# Patient Record
Sex: Female | Born: 1965 | Race: White | Hispanic: No | Marital: Single | Smoking: Never smoker
Health system: Southern US, Community
[De-identification: ages and names within clinical notes are randomized; demographics above are authoritative.]

## PROBLEM LIST (undated history)

## (undated) DIAGNOSIS — I341 Nonrheumatic mitral (valve) prolapse: Secondary | ICD-10-CM

## (undated) DIAGNOSIS — M419 Scoliosis, unspecified: Secondary | ICD-10-CM

## (undated) DIAGNOSIS — D219 Benign neoplasm of connective and other soft tissue, unspecified: Secondary | ICD-10-CM

## (undated) DIAGNOSIS — M549 Dorsalgia, unspecified: Secondary | ICD-10-CM

## (undated) DIAGNOSIS — M21372 Foot drop, left foot: Secondary | ICD-10-CM

## (undated) DIAGNOSIS — A4902 Methicillin resistant Staphylococcus aureus infection, unspecified site: Secondary | ICD-10-CM

## (undated) DIAGNOSIS — R5383 Other fatigue: Secondary | ICD-10-CM

## (undated) DIAGNOSIS — I10 Essential (primary) hypertension: Secondary | ICD-10-CM

## (undated) DIAGNOSIS — T148XXA Other injury of unspecified body region, initial encounter: Secondary | ICD-10-CM

## (undated) DIAGNOSIS — E119 Type 2 diabetes mellitus without complications: Secondary | ICD-10-CM

## (undated) DIAGNOSIS — M1990 Unspecified osteoarthritis, unspecified site: Secondary | ICD-10-CM

## (undated) DIAGNOSIS — D649 Anemia, unspecified: Secondary | ICD-10-CM

## (undated) DIAGNOSIS — R61 Generalized hyperhidrosis: Secondary | ICD-10-CM

## (undated) DIAGNOSIS — K649 Unspecified hemorrhoids: Secondary | ICD-10-CM

## (undated) DIAGNOSIS — L089 Local infection of the skin and subcutaneous tissue, unspecified: Secondary | ICD-10-CM

## (undated) HISTORY — DX: Other fatigue: R53.83

## (undated) HISTORY — PX: SPINE SURGERY: SHX786

## (undated) HISTORY — DX: Unspecified hemorrhoids: K64.9

## (undated) HISTORY — PX: GASTRIC BYPASS: SHX52

## (undated) HISTORY — PX: BACK SURGERY: SHX140

## (undated) HISTORY — PX: HERNIA REPAIR: SHX51

## (undated) HISTORY — PX: BREAST LUMPECTOMY: SHX2

## (undated) HISTORY — DX: Unspecified osteoarthritis, unspecified site: M19.90

## (undated) HISTORY — DX: Generalized hyperhidrosis: R61

## (undated) HISTORY — DX: Methicillin resistant Staphylococcus aureus infection, unspecified site: A49.02

## (undated) HISTORY — DX: Essential (primary) hypertension: I10

---
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
## 2009-01-15 ENCOUNTER — Encounter: Admission: RE | Admit: 2009-01-15 | Discharge: 2009-01-15 | Payer: Self-pay | Admitting: *Deleted

## 2009-04-24 ENCOUNTER — Ambulatory Visit: Payer: Self-pay | Admitting: Vascular Surgery

## 2009-04-24 ENCOUNTER — Inpatient Hospital Stay (HOSPITAL_COMMUNITY): Admission: RE | Admit: 2009-04-24 | Discharge: 2009-04-28 | Payer: Self-pay | Admitting: *Deleted

## 2009-05-06 ENCOUNTER — Encounter: Admission: RE | Admit: 2009-05-06 | Discharge: 2009-05-06 | Payer: Self-pay | Admitting: *Deleted

## 2009-05-12 ENCOUNTER — Inpatient Hospital Stay (HOSPITAL_COMMUNITY): Admission: RE | Admit: 2009-05-12 | Discharge: 2009-05-20 | Payer: Self-pay | Admitting: *Deleted

## 2009-05-19 ENCOUNTER — Ambulatory Visit: Payer: Self-pay | Admitting: Physical Medicine & Rehabilitation

## 2009-05-20 ENCOUNTER — Inpatient Hospital Stay (HOSPITAL_COMMUNITY)
Admission: RE | Admit: 2009-05-20 | Discharge: 2009-05-29 | Payer: Self-pay | Admitting: Physical Medicine & Rehabilitation

## 2009-05-20 ENCOUNTER — Ambulatory Visit: Payer: Self-pay | Admitting: Physical Medicine & Rehabilitation

## 2010-04-01 ENCOUNTER — Encounter: Admission: RE | Admit: 2010-04-01 | Discharge: 2010-04-01 | Payer: Self-pay | Admitting: *Deleted

## 2010-04-17 ENCOUNTER — Encounter
Admission: RE | Admit: 2010-04-17 | Discharge: 2010-04-17 | Payer: Self-pay | Source: Home / Self Care | Attending: Orthopedic Surgery | Admitting: Orthopedic Surgery

## 2010-06-26 ENCOUNTER — Encounter (HOSPITAL_BASED_OUTPATIENT_CLINIC_OR_DEPARTMENT_OTHER): Payer: 59 | Attending: General Surgery

## 2010-06-26 DIAGNOSIS — Z980 Intestinal bypass and anastomosis status: Secondary | ICD-10-CM | POA: Insufficient documentation

## 2010-06-26 DIAGNOSIS — L0501 Pilonidal cyst with abscess: Secondary | ICD-10-CM | POA: Insufficient documentation

## 2010-07-03 ENCOUNTER — Encounter (HOSPITAL_BASED_OUTPATIENT_CLINIC_OR_DEPARTMENT_OTHER): Payer: 59

## 2010-07-10 ENCOUNTER — Encounter (HOSPITAL_BASED_OUTPATIENT_CLINIC_OR_DEPARTMENT_OTHER): Payer: 59 | Attending: General Surgery

## 2010-07-10 DIAGNOSIS — Z980 Intestinal bypass and anastomosis status: Secondary | ICD-10-CM | POA: Insufficient documentation

## 2010-07-10 DIAGNOSIS — L0501 Pilonidal cyst with abscess: Secondary | ICD-10-CM | POA: Insufficient documentation

## 2010-07-26 LAB — DIFFERENTIAL
Eosinophils Relative: 3 % (ref 0–5)
Lymphocytes Relative: 19 % (ref 12–46)
Lymphocytes Relative: 24 % (ref 12–46)
Lymphs Abs: 1.2 10*3/uL (ref 0.7–4.0)
Lymphs Abs: 1.3 10*3/uL (ref 0.7–4.0)
Monocytes Absolute: 0.4 10*3/uL (ref 0.1–1.0)
Monocytes Relative: 7 % (ref 3–12)
Neutro Abs: 3.3 10*3/uL (ref 1.7–7.7)
Neutro Abs: 4.7 10*3/uL (ref 1.7–7.7)

## 2010-07-26 LAB — CBC
Hemoglobin: 9.2 g/dL — ABNORMAL LOW (ref 12.0–15.0)
Hemoglobin: 9.9 g/dL — ABNORMAL LOW (ref 12.0–15.0)
MCHC: 32.8 g/dL (ref 30.0–36.0)
MCHC: 33 g/dL (ref 30.0–36.0)
MCHC: 33.7 g/dL (ref 30.0–36.0)
MCHC: 33.8 g/dL (ref 30.0–36.0)
MCV: 87.6 fL (ref 78.0–100.0)
MCV: 89.1 fL (ref 78.0–100.0)
Platelets: 523 10*3/uL — ABNORMAL HIGH (ref 150–400)
Platelets: 578 10*3/uL — ABNORMAL HIGH (ref 150–400)
RBC: 3.05 MIL/uL — ABNORMAL LOW (ref 3.87–5.11)
RBC: 3.14 MIL/uL — ABNORMAL LOW (ref 3.87–5.11)
RBC: 3.3 MIL/uL — ABNORMAL LOW (ref 3.87–5.11)
RDW: 16.1 % — ABNORMAL HIGH (ref 11.5–15.5)
RDW: 16.5 % — ABNORMAL HIGH (ref 11.5–15.5)
WBC: 10.1 10*3/uL (ref 4.0–10.5)

## 2010-07-26 LAB — COMPREHENSIVE METABOLIC PANEL
ALT: 20 U/L (ref 0–35)
AST: 18 U/L (ref 0–37)
AST: 25 U/L (ref 0–37)
Albumin: 2.7 g/dL — ABNORMAL LOW (ref 3.5–5.2)
BUN: 11 mg/dL (ref 6–23)
CO2: 29 mEq/L (ref 19–32)
Calcium: 8.4 mg/dL (ref 8.4–10.5)
Calcium: 9.2 mg/dL (ref 8.4–10.5)
Creatinine, Ser: 0.63 mg/dL (ref 0.4–1.2)
Creatinine, Ser: 0.7 mg/dL (ref 0.4–1.2)
GFR calc Af Amer: >60 mL/min (ref >60)
GFR calc Af Amer: >60 mL/min (ref >60)
GFR calc non Af Amer: >60 mL/min (ref >60)
Sodium: 138 mEq/L (ref 135–145)
Total Protein: 6.6 g/dL (ref 6.0–8.3)

## 2010-07-26 LAB — TYPE AND SCREEN
ABO/RH(D): O POS
Antibody Screen: NEGATIVE

## 2010-07-26 LAB — POCT I-STAT 4, (NA,K, GLUC, HGB,HCT)
HCT: 23 % — ABNORMAL LOW (ref 36.0–46.0)
Hemoglobin: 7.8 g/dL — ABNORMAL LOW (ref 12.0–15.0)
Potassium: 4.5 mEq/L (ref 3.5–5.1)
Sodium: 136 mEq/L (ref 135–145)

## 2010-07-26 LAB — URINALYSIS, ROUTINE W REFLEX MICROSCOPIC
Bilirubin Urine: NEGATIVE
Ketones, ur: NEGATIVE mg/dL
Nitrite: NEGATIVE
Specific Gravity, Urine: 1.02 (ref 1.005–1.030)
Urobilinogen, UA: 1 mg/dL (ref 0.0–1.0)

## 2010-07-26 LAB — APTT: aPTT: 36 seconds (ref 24–37)

## 2010-08-10 LAB — POCT I-STAT 4, (NA,K, GLUC, HGB,HCT)
Glucose, Bld: 135 mg/dL — ABNORMAL HIGH (ref 70–99)
Glucose, Bld: 145 mg/dL — ABNORMAL HIGH (ref 70–99)
Glucose, Bld: 146 mg/dL — ABNORMAL HIGH (ref 70–99)
Glucose, Bld: 152 mg/dL — ABNORMAL HIGH (ref 70–99)
HCT: 30 % — ABNORMAL LOW (ref 36.0–46.0)
HCT: 31 % — ABNORMAL LOW (ref 36.0–46.0)
HCT: 34 % — ABNORMAL LOW (ref 36.0–46.0)
Hemoglobin: 10.2 g/dL — ABNORMAL LOW (ref 12.0–15.0)
Hemoglobin: 10.5 g/dL — ABNORMAL LOW (ref 12.0–15.0)
Hemoglobin: 11.6 g/dL — ABNORMAL LOW (ref 12.0–15.0)
Potassium: 4 mEq/L (ref 3.5–5.1)
Potassium: 4 mEq/L (ref 3.5–5.1)
Potassium: 4.4 mEq/L (ref 3.5–5.1)
Sodium: 138 mEq/L (ref 135–145)
Sodium: 138 mEq/L (ref 135–145)
Sodium: 138 mEq/L (ref 135–145)

## 2010-08-10 LAB — CBC
Hemoglobin: 11.4 g/dL — ABNORMAL LOW (ref 12.0–15.0)
MCHC: 34.1 g/dL (ref 30.0–36.0)
RBC: 3.58 MIL/uL — ABNORMAL LOW (ref 3.87–5.11)

## 2010-08-10 LAB — BASIC METABOLIC PANEL
Calcium: 7.6 mg/dL — ABNORMAL LOW (ref 8.4–10.5)
Creatinine, Ser: 0.7 mg/dL (ref 0.4–1.2)
GFR calc Af Amer: >60 mL/min (ref >60)
GFR calc non Af Amer: >60 mL/min (ref >60)
Sodium: 135 mEq/L (ref 135–145)

## 2010-08-11 ENCOUNTER — Encounter (HOSPITAL_BASED_OUTPATIENT_CLINIC_OR_DEPARTMENT_OTHER): Payer: 59 | Attending: Plastic Surgery

## 2010-08-11 DIAGNOSIS — Z9884 Bariatric surgery status: Secondary | ICD-10-CM | POA: Insufficient documentation

## 2010-08-11 DIAGNOSIS — L98499 Non-pressure chronic ulcer of skin of other sites with unspecified severity: Secondary | ICD-10-CM | POA: Insufficient documentation

## 2010-08-11 DIAGNOSIS — Z79899 Other long term (current) drug therapy: Secondary | ICD-10-CM | POA: Insufficient documentation

## 2010-08-11 DIAGNOSIS — M4120 Other idiopathic scoliosis, site unspecified: Secondary | ICD-10-CM | POA: Insufficient documentation

## 2010-08-11 DIAGNOSIS — T8189XA Other complications of procedures, not elsewhere classified, initial encounter: Secondary | ICD-10-CM | POA: Insufficient documentation

## 2010-08-11 DIAGNOSIS — Y838 Other surgical procedures as the cause of abnormal reaction of the patient, or of later complication, without mention of misadventure at the time of the procedure: Secondary | ICD-10-CM | POA: Insufficient documentation

## 2010-08-11 LAB — TYPE AND SCREEN
ABO/RH(D): O POS
Antibody Screen: NEGATIVE

## 2010-08-11 LAB — URINALYSIS, ROUTINE W REFLEX MICROSCOPIC
Nitrite: NEGATIVE
Specific Gravity, Urine: 1.011 (ref 1.005–1.030)
pH: 6 (ref 5.0–8.0)

## 2010-08-11 LAB — DIFFERENTIAL
Basophils Relative: 1 % (ref 0–1)
Lymphocytes Relative: 22 % (ref 12–46)
Lymphs Abs: 1.8 10*3/uL (ref 0.7–4.0)
Monocytes Absolute: 0.5 10*3/uL (ref 0.1–1.0)
Monocytes Relative: 6 % (ref 3–12)
Neutro Abs: 5.7 10*3/uL (ref 1.7–7.7)

## 2010-08-11 LAB — COMPREHENSIVE METABOLIC PANEL
Albumin: 4.2 g/dL (ref 3.5–5.2)
Alkaline Phosphatase: 102 U/L (ref 39–117)
BUN: 8 mg/dL (ref 6–23)
Potassium: 4 mEq/L (ref 3.5–5.1)
Total Protein: 7.4 g/dL (ref 6.0–8.3)

## 2010-08-11 LAB — CBC
Hemoglobin: 14.3 g/dL (ref 12.0–15.0)
MCHC: 33.7 g/dL (ref 30.0–36.0)
MCV: 93.1 fL (ref 78.0–100.0)
RBC: 4.55 MIL/uL (ref 3.87–5.11)
WBC: 8.1 10*3/uL (ref 4.0–10.5)

## 2010-08-11 LAB — ABO/RH: ABO/RH(D): O POS

## 2010-08-11 LAB — APTT: aPTT: 33 seconds (ref 24–37)

## 2010-09-11 ENCOUNTER — Encounter (HOSPITAL_BASED_OUTPATIENT_CLINIC_OR_DEPARTMENT_OTHER): Payer: 59 | Attending: General Surgery

## 2010-10-09 ENCOUNTER — Encounter (HOSPITAL_BASED_OUTPATIENT_CLINIC_OR_DEPARTMENT_OTHER): Payer: 59 | Attending: General Surgery

## 2010-10-09 DIAGNOSIS — Z9884 Bariatric surgery status: Secondary | ICD-10-CM | POA: Insufficient documentation

## 2010-10-09 DIAGNOSIS — Y838 Other surgical procedures as the cause of abnormal reaction of the patient, or of later complication, without mention of misadventure at the time of the procedure: Secondary | ICD-10-CM | POA: Insufficient documentation

## 2010-10-09 DIAGNOSIS — Z981 Arthrodesis status: Secondary | ICD-10-CM | POA: Insufficient documentation

## 2010-10-09 DIAGNOSIS — M4120 Other idiopathic scoliosis, site unspecified: Secondary | ICD-10-CM | POA: Insufficient documentation

## 2010-10-09 DIAGNOSIS — L98499 Non-pressure chronic ulcer of skin of other sites with unspecified severity: Secondary | ICD-10-CM | POA: Insufficient documentation

## 2010-10-09 DIAGNOSIS — T8189XA Other complications of procedures, not elsewhere classified, initial encounter: Secondary | ICD-10-CM | POA: Insufficient documentation

## 2010-10-09 DIAGNOSIS — Z79899 Other long term (current) drug therapy: Secondary | ICD-10-CM | POA: Insufficient documentation

## 2010-11-10 ENCOUNTER — Encounter (HOSPITAL_BASED_OUTPATIENT_CLINIC_OR_DEPARTMENT_OTHER): Payer: 59 | Attending: General Surgery

## 2010-11-10 DIAGNOSIS — T8189XA Other complications of procedures, not elsewhere classified, initial encounter: Secondary | ICD-10-CM | POA: Insufficient documentation

## 2010-11-10 DIAGNOSIS — Y838 Other surgical procedures as the cause of abnormal reaction of the patient, or of later complication, without mention of misadventure at the time of the procedure: Secondary | ICD-10-CM | POA: Insufficient documentation

## 2010-11-10 DIAGNOSIS — M4120 Other idiopathic scoliosis, site unspecified: Secondary | ICD-10-CM | POA: Insufficient documentation

## 2010-11-10 DIAGNOSIS — Z79899 Other long term (current) drug therapy: Secondary | ICD-10-CM | POA: Insufficient documentation

## 2010-11-10 DIAGNOSIS — L98499 Non-pressure chronic ulcer of skin of other sites with unspecified severity: Secondary | ICD-10-CM | POA: Insufficient documentation

## 2010-11-10 DIAGNOSIS — Z981 Arthrodesis status: Secondary | ICD-10-CM | POA: Insufficient documentation

## 2010-11-10 DIAGNOSIS — Z9884 Bariatric surgery status: Secondary | ICD-10-CM | POA: Insufficient documentation

## 2010-11-15 ENCOUNTER — Emergency Department (HOSPITAL_BASED_OUTPATIENT_CLINIC_OR_DEPARTMENT_OTHER)
Admission: EM | Admit: 2010-11-15 | Discharge: 2010-11-15 | Disposition: A | Discharge status: Home / Self Care | Payer: 59 | Attending: Emergency Medicine | Admitting: Emergency Medicine

## 2010-11-15 ENCOUNTER — Encounter: Payer: Self-pay | Admitting: Emergency Medicine

## 2010-11-15 DIAGNOSIS — G8929 Other chronic pain: Secondary | ICD-10-CM | POA: Insufficient documentation

## 2010-11-15 DIAGNOSIS — M5430 Sciatica, unspecified side: Secondary | ICD-10-CM

## 2010-11-15 DIAGNOSIS — M549 Dorsalgia, unspecified: Secondary | ICD-10-CM | POA: Insufficient documentation

## 2010-11-15 HISTORY — DX: Dorsalgia, unspecified: M54.9

## 2010-11-15 HISTORY — DX: Local infection of the skin and subcutaneous tissue, unspecified: L08.9

## 2010-11-15 HISTORY — DX: Other injury of unspecified body region, initial encounter: T14.8XXA

## 2010-11-15 MED ORDER — OXYCODONE-ACETAMINOPHEN 5-325 MG PO TABS
2.0000 | ORAL_TABLET | Freq: Once | ORAL | Status: AC
  Administered 2010-11-15: 2 via ORAL

## 2010-11-15 MED ORDER — OXYCODONE-ACETAMINOPHEN 5-325 MG PO TABS
ORAL_TABLET | ORAL | Status: AC
  Administered 2010-11-15: 2 via ORAL
  Filled 2010-11-15: qty 2

## 2010-11-15 MED ORDER — HYDROMORPHONE HCL 2 MG PO TABS: 2.0000 mg | ORAL_TABLET | ORAL | Status: AC | PRN

## 2010-11-15 MED ORDER — HYDROMORPHONE HCL 2 MG/ML IJ SOLN
2.0000 mg | Freq: Once | INTRAMUSCULAR | Status: AC
  Administered 2010-11-15: 2 mg via INTRAMUSCULAR
  Filled 2010-11-15: qty 1

## 2010-11-15 MED ORDER — DIAZEPAM 5 MG PO TABS
5.0000 mg | ORAL_TABLET | Freq: Once | ORAL | Status: AC
  Administered 2010-11-15: 5 mg via ORAL
  Filled 2010-11-15: qty 1

## 2010-11-15 NOTE — ED Provider Notes (Signed)
History     Chief Complaint  Patient presents with  . Back Pain   HPI Comments: 45 y.o. female With known Past Medical History:  chronic  Back pain  , chronic open  Wound on back, known spinal stenosis.  States "wrenched" back in bed 2 nights ago.  Currently taking 10 mg Oxycodone per day which is not helping. States daily pain is normally "6/10."  Denies fever, active drainage.                                               Patient is a 45 y.o. female presenting with back pain. The history is provided by the patient and a friend.  Back Pain  This is a recurrent problem. The current episode started yesterday. The pain is associated with twisting. The pain is present in the lumbar spine. The pain radiates to the left thigh. The pain is at a severity of 9/10. The pain is severe. The symptoms are aggravated by certain positions. Pertinent negatives include no fever, no numbness, no abdominal pain, no bladder incontinence, no dysuria, no pelvic pain and no weakness. Risk factors include obesity.    Past Medical History  Diagnosis Date  . Back pain   . Wound infection     Past Surgical History  Procedure Date  . Back surgery   . Breast lumpectomy   . Gastric bypass     No family history on file.  History  Substance Use Topics  . Smoking status: Never Smoker   . Smokeless tobacco: Not on file  . Alcohol Use: No    OB History    Grav Para Term Preterm Abortions TAB SAB Ect Mult Living                  Review of Systems  Constitutional: Negative for fever.  Gastrointestinal: Negative for abdominal pain.  Genitourinary: Negative for bladder incontinence, dysuria and pelvic pain.  Musculoskeletal: Positive for back pain.  Neurological: Negative for weakness and numbness.  All other systems reviewed and are negative.    Physical Exam  BP 122/85  Pulse 127  Temp(Src) 99.3 F (37.4 C) (Oral)  Resp 20  SpO2 95%  Physical Exam  ED Course  Procedures  MDM 45 yo  female with fairly severe chronic pain, known lumbar stenosis, previous fusion, and small non healing open wound of back.  Twisted back in bed 2 nights ago.  Current home meds not helping. Reflexes normal, no bowel or bladder incontinence;  Will treat pain.  Anticipate dc home after improvement and will need close f/u with her back specialist.      Past medical history and ER history reviewed   Navia Lindahl A. Lauris Poag, MD 11/15/10 1339

## 2010-11-25 ENCOUNTER — Encounter: Payer: Self-pay | Admitting: Internal Medicine

## 2010-11-25 ENCOUNTER — Encounter: Payer: Self-pay | Admitting: Licensed Clinical Social Worker

## 2010-11-25 ENCOUNTER — Ambulatory Visit (INDEPENDENT_AMBULATORY_CARE_PROVIDER_SITE_OTHER): Payer: 59 | Admitting: Internal Medicine

## 2010-11-25 VITALS — BP 147/106 | HR 125 | Temp 98.0°F | Wt 258.8 lb

## 2010-11-25 DIAGNOSIS — Z992 Dependence on renal dialysis: Secondary | ICD-10-CM | POA: Insufficient documentation

## 2010-11-25 DIAGNOSIS — G629 Polyneuropathy, unspecified: Secondary | ICD-10-CM | POA: Insufficient documentation

## 2010-11-25 DIAGNOSIS — A4902 Methicillin resistant Staphylococcus aureus infection, unspecified site: Secondary | ICD-10-CM | POA: Insufficient documentation

## 2010-11-25 DIAGNOSIS — M329 Systemic lupus erythematosus, unspecified: Secondary | ICD-10-CM | POA: Insufficient documentation

## 2010-11-25 DIAGNOSIS — N186 End stage renal disease: Secondary | ICD-10-CM | POA: Insufficient documentation

## 2010-11-25 DIAGNOSIS — J449 Chronic obstructive pulmonary disease, unspecified: Secondary | ICD-10-CM | POA: Insufficient documentation

## 2010-11-25 DIAGNOSIS — G459 Transient cerebral ischemic attack, unspecified: Secondary | ICD-10-CM | POA: Insufficient documentation

## 2010-11-25 DIAGNOSIS — I2510 Atherosclerotic heart disease of native coronary artery without angina pectoris: Secondary | ICD-10-CM | POA: Insufficient documentation

## 2010-11-25 DIAGNOSIS — IMO0002 Reserved for concepts with insufficient information to code with codable children: Secondary | ICD-10-CM | POA: Insufficient documentation

## 2010-11-25 DIAGNOSIS — I509 Heart failure, unspecified: Secondary | ICD-10-CM | POA: Insufficient documentation

## 2010-11-25 DIAGNOSIS — I890 Lymphedema, not elsewhere classified: Secondary | ICD-10-CM | POA: Insufficient documentation

## 2010-11-25 DIAGNOSIS — M419 Scoliosis, unspecified: Secondary | ICD-10-CM | POA: Insufficient documentation

## 2010-11-25 DIAGNOSIS — I771 Stricture of artery: Secondary | ICD-10-CM | POA: Insufficient documentation

## 2010-11-25 DIAGNOSIS — I639 Cerebral infarction, unspecified: Secondary | ICD-10-CM | POA: Insufficient documentation

## 2010-11-25 DIAGNOSIS — L0291 Cutaneous abscess, unspecified: Secondary | ICD-10-CM

## 2010-11-25 MED ORDER — RIFAMPIN 300 MG PO CAPS: 300.0000 mg | ORAL_CAPSULE | Freq: Two times a day (BID) | ORAL | Status: AC

## 2010-11-25 MED ORDER — SULFAMETHOXAZOLE-TMP DS 800-160 MG PO TABS: 2.0000 | ORAL_TABLET | Freq: Two times a day (BID) | ORAL | Status: AC

## 2010-11-25 NOTE — Assessment & Plan Note (Signed)
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  Has improved and no outward signs of active infection.  I am concerned that she could have a fluid collection but with no systemic symptoms, it seems unlikely.  She also may have other organisms (ie - strep) that aren't covered by current antibiotics.  Though at this time, it is difficult to assess, I will add Rifampin for staph and strep coverage and have her continue that until seen by surgery and get their opinion regarding ?CT, debridement, etc..  She then will return in 2 weeks.

## 2010-11-25 NOTE — Progress Notes (Signed)
  Subjective:    Patient ID: Leah Franco, female    DOB: 1965/09/23, 45 y.o.   MRN: [id number]  HPI 45 yo with non-healing wound for several months and recent pus-like discharge.  At the wound care, it was noticed that she had white pus coming out about 2 weeks ago and culture performed c/w MRSA, Septra sensitive.  She states since then, the drainage has subsided significantly, though is still having some.  She denies fever, chills.  She was referred by wound care due to the concern for need for IV antibiotics and the non-healing nature and concern for infection.  By her report, there has been intermittent progress with closure but now is being referred to a surgeon for consideration of debridement and further management.  She has been on Septra for a 10 day course.     Review of Systems  Constitutional: Negative.   HENT: Negative.   Respiratory: Negative.   Cardiovascular: Negative.   Gastrointestinal: Negative.   Musculoskeletal: Negative.   Skin:       Drainage at site  Neurological: Negative.   Hematological: Negative.        Objective:   Physical Exam  Constitutional: She is oriented to person, place, and time. She appears well-developed and well-nourished.  Cardiovascular: Normal rate, regular rhythm and normal heart sounds.   No murmur heard. Pulmonary/Chest: Effort normal. No respiratory distress. She has no wheezes.  Abdominal: Soft. Bowel sounds are normal. She exhibits no distension.  Neurological: She is alert and oriented to person, place, and time.  Skin:       Opening at top of gluteal fold, no erythema, some clear/whitish drainage.  Non-tender, no edema.   Psychiatric: She has a normal mood and affect. Her behavior is normal.          Assessment & Plan:

## 2010-12-08 ENCOUNTER — Encounter (INDEPENDENT_AMBULATORY_CARE_PROVIDER_SITE_OTHER): Payer: Self-pay | Admitting: Surgery

## 2010-12-09 ENCOUNTER — Ambulatory Visit (INDEPENDENT_AMBULATORY_CARE_PROVIDER_SITE_OTHER): Payer: 59 | Admitting: Internal Medicine

## 2010-12-09 ENCOUNTER — Encounter: Payer: Self-pay | Admitting: Internal Medicine

## 2010-12-09 DIAGNOSIS — A4902 Methicillin resistant Staphylococcus aureus infection, unspecified site: Secondary | ICD-10-CM

## 2010-12-09 NOTE — Assessment & Plan Note (Signed)
CA -MRSA - I told her to complete the antibiotics and stop. She is going to see her surgeon tomorrow to consider further wound therapy (?VAC).  At this tim there is no significant symtpoms but will await surgery input.

## 2010-12-09 NOTE — Progress Notes (Signed)
  Subjective:    Patient ID: Leah Franco, female    DOB: 1965-10-13, 45 y.o.   MRN: [id number]  HPI here for follow up for draining chronic wound.  Has been on Septra.  Concern from wound care with resistant infection. I had previously extended her antibiotics.  Today she reports no further drainage, no pain, and only one fever about 5 days ago.  She otherwise feels well.      Review of Systems  All other systems reviewed and are negative.       Objective:   Physical Exam  Constitutional: She is oriented to person, place, and time. She appears well-developed and well-nourished. No distress.  Neurological: She is alert and oriented to person, place, and time.  Skin:       No drainage          Assessment & Plan:

## 2010-12-10 ENCOUNTER — Ambulatory Visit (INDEPENDENT_AMBULATORY_CARE_PROVIDER_SITE_OTHER): Payer: 59 | Admitting: Surgery

## 2010-12-10 ENCOUNTER — Encounter (INDEPENDENT_AMBULATORY_CARE_PROVIDER_SITE_OTHER): Payer: Self-pay | Admitting: Surgery

## 2010-12-10 VITALS — BP 138/94 | HR 120 | Temp 97.2°F | Ht 63.5 in | Wt 238.8 lb

## 2010-12-10 DIAGNOSIS — T8189XA Other complications of procedures, not elsewhere classified, initial encounter: Secondary | ICD-10-CM

## 2010-12-10 NOTE — Progress Notes (Signed)
Subjective:     Patient ID: Leah Franco, female   DOB: Apr 09, 1966, 45 y.o.   MRN: [id number]  HPI This is a pleasant 45 year old female that Dr. Johney Maine earlier this year with the chronic pilonidal wound. She has had multiple spinal procedures and it is difficult to tell whether this is related to that with chronic infection her incision or actual cyst. The wound has persisted despite extensive care including a wound VAC. I am seeing her for a second opinion.  Review of Systems     Objective:   Physical Exam On examination, she does have a small open area at the gluteal cleft that is at the lower end of her incision. I do not see any ingrown hairs. There is no purulence today. By her report the wound is actually now getting smaller.    Assessment:       Chronic nonhealing surgical wound Plan:     I had a long discussion with her regarding the wound. I do believe it would be reasonable for Dr. Lynann Bologna . to excise this area again in the operating room. As she is showing some healing however I told her to wait to see him for a couple more weeks just to see. He may need to consider a plastic surgery consult for a possible flap should he feel like this needed to be accomplished to cover the hardware. She will see me as needed.

## 2010-12-11 ENCOUNTER — Encounter (HOSPITAL_BASED_OUTPATIENT_CLINIC_OR_DEPARTMENT_OTHER): Payer: 59 | Attending: General Surgery

## 2010-12-11 DIAGNOSIS — T8189XA Other complications of procedures, not elsewhere classified, initial encounter: Secondary | ICD-10-CM | POA: Insufficient documentation

## 2010-12-11 DIAGNOSIS — Y849 Medical procedure, unspecified as the cause of abnormal reaction of the patient, or of later complication, without mention of misadventure at the time of the procedure: Secondary | ICD-10-CM | POA: Insufficient documentation

## 2011-01-15 ENCOUNTER — Encounter (HOSPITAL_BASED_OUTPATIENT_CLINIC_OR_DEPARTMENT_OTHER): Payer: 59

## 2012-07-25 ENCOUNTER — Telehealth: Payer: Self-pay | Admitting: Genetic Counselor

## 2012-07-25 NOTE — Telephone Encounter (Signed)
Pt scheduled for genetic testing on 06/05 @ 9 w/Karen Powel.  Welcome packet mailed.

## 2012-10-12 ENCOUNTER — Encounter: Payer: Self-pay | Admitting: Genetic Counselor

## 2012-10-12 ENCOUNTER — Other Ambulatory Visit: Payer: Managed Care, Other (non HMO) | Admitting: Lab

## 2012-10-12 ENCOUNTER — Ambulatory Visit (HOSPITAL_BASED_OUTPATIENT_CLINIC_OR_DEPARTMENT_OTHER): Payer: Managed Care, Other (non HMO) | Admitting: Genetic Counselor

## 2012-10-12 DIAGNOSIS — Z803 Family history of malignant neoplasm of breast: Secondary | ICD-10-CM

## 2012-10-12 DIAGNOSIS — Z800 Family history of malignant neoplasm of digestive organs: Secondary | ICD-10-CM

## 2012-10-12 DIAGNOSIS — Z8041 Family history of malignant neoplasm of ovary: Secondary | ICD-10-CM

## 2012-10-12 DIAGNOSIS — Z8481 Family history of carrier of genetic disease: Secondary | ICD-10-CM

## 2012-10-12 NOTE — Progress Notes (Signed)
Dr.  Scarlette Ar requested a consultation for genetic counseling and risk assessment for Leah Franco, a 47 y.o. female, for discussion of her family history of a BRCA2 mutation. She presents to clinic today to discuss the possibility of a genetic predisposition to cancer, and to further clarify her risks, as well as her family members' risks for cancer.   HISTORY OF PRESENT ILLNESS: Leah Franco is a 47 y.o. female with no personal history of cancer.  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  She has been on tamoxifen for 2 years and was having problems with that so she was switched to Evista for the remaining 3 years.  [name] was present at the results session of her sister, Leah Franco, so is familiar with the recommendations that would be made if she tests positive for the family mutation.  [name] is at 50% risk for testing positive.  Past Medical History  Diagnosis Date  . Back pain   . Wound infection   . Night sweats   . Fatigue   . MRSA (methicillin resistant Staphylococcus aureus)   . Hypertension   . Hemorrhoid   . Arthritis     Past Surgical History  Procedure Laterality Date  . Back surgery    . Gastric bypass    . Breast lumpectomy      lft  . Spine surgery      corrective spine collapsed, scoliosis and scoliosis revision    History  Substance Use Topics  . Smoking status: Never Smoker   . Smokeless tobacco: Never Used  . Alcohol Use: No     Comment: rare    REPRODUCTIVE HISTORY AND PERSONAL RISK ASSESSMENT FACTORS: Menarche was at age 88.   Perimenopausal Uterus Intact: Yes Ovaries Intact: Yes [id number], first live birth at age N/A  She has not previously undergone treatment for infertility.   OCP use for about 5 years   She has not used HRT in the past.    FAMILY HISTORY:  We obtained a detailed, 4-generation family history.  Significant diagnoses are listed below: Family History  Problem Relation  Age of Onset  . BRCA 1/2 Mother     BRCA2+ by default, no testing  . Breast cancer Sister 85    BRCA2+  . BRCA 1/2 Sister 13    BRCA2 +  . Pancreatic cancer Maternal Uncle     Dx in his 26s  . BRCA 1/2 Maternal Uncle     BRCA2+  . Prostate cancer Maternal Grandfather   . Breast cancer Maternal Aunt     dx in 5s  . Ovarian cancer Maternal Aunt     dx in 31s  . Esophageal cancer Maternal Uncle 62  . BRCA 1/2 Maternal Uncle     BRCA2+  . Colon cancer Maternal Uncle 86  . Breast cancer Cousin     dx in her 49s  . BRCA 1/2 Cousin     dx in her 75s; BRCA2+   Patient's ancestors are of [place], [place] and [place] descent. There is no reported Ashkenazi Jewish ancestry. There is no  known consanguinity.  GENETIC COUNSELING ASSESSMENT: [name] is a 47 y.o. female with a family history of a BRCA2 mutation, and a personal history of atypical hyperplasia which gives her a 50% chance of having the familial BRCA2 mutation and a predisposition to cancer. We, therefore, discussed and recommended the following at today's visit:  DISCUSSION: We reviewed the characteristics, features and inheritance patterns of hereditary cancer syndromes. We also discussed genetic testing, including the appropriate family members to test, the process of testing, insurance coverage and turn-around-time for results. We recommended [name] pursue genetic testing for the familial mutation in BRCA2 at [facility].   PLAN: After considering the risks, benefits, and limitations, [name] provided informed consent to pursue genetic testing and the blood sample will be sent to [facility] for analysis of the familial mutation in BRCA2. We discussed the implications of a positive or negative genetic test result. Results should be available within approximately 7-10 days time, at which point they will be disclosed by telephone to [facility], as will any additional recommendations warranted by  these results. [name] will receive a summary of her genetic counseling visit and a copy of her results once available. This information will also be available in [facility]. We encouraged [name] to remain in contact with cancer genetics annually so that we can continuously update the family history and inform her of any changes in cancer genetics and testing that may be of benefit for her family. Philip A Carles's questions were answered to her satisfaction today. Our contact information was provided should additional questions or concerns arise.   Per the patient's request, we will contact her by telephone to discuss these results. A follow up genetic counseling visit will be scheduled if indicated.  The patient was seen for a total of 30 minutes, greater than 50% of which was spent face-to-face counseling.  This plan is being carried out per Dr. Dana Allan recommendations.  This note will also be sent to the referring provider via the electronic medical record. The patient will be supplied with a summary of this genetic counseling discussion as well as educational information on the discussed hereditary cancer syndromes following the conclusion of their visit.   Patient was discussed with Dr. Marcy Panning.   _______________________________________________________________________ For Office Staff:  Number of people involved in session: 2 Was an Intern/ student involved with case: yes

## 2012-10-17 ENCOUNTER — Telehealth: Payer: Self-pay | Admitting: Genetic Counselor

## 2012-10-17 NOTE — Telephone Encounter (Signed)
Left good news message on VM and asked that she call back.

## 2012-10-18 ENCOUNTER — Encounter: Payer: Self-pay | Admitting: Genetic Counselor

## 2016-05-10 HISTORY — PX: TOTAL HIP ARTHROPLASTY: SHX124

## 2020-03-10 DIAGNOSIS — K922 Gastrointestinal hemorrhage, unspecified: Secondary | ICD-10-CM

## 2020-03-10 HISTORY — DX: Gastrointestinal hemorrhage, unspecified: K92.2

## 2020-03-12 ENCOUNTER — Encounter (HOSPITAL_BASED_OUTPATIENT_CLINIC_OR_DEPARTMENT_OTHER): Payer: Self-pay | Admitting: Emergency Medicine

## 2020-03-12 ENCOUNTER — Encounter (HOSPITAL_COMMUNITY)
Admission: EM | Disposition: A | Discharge status: Home / Self Care | Payer: Self-pay | Source: Home / Self Care | Attending: Family Medicine

## 2020-03-12 ENCOUNTER — Other Ambulatory Visit: Payer: Self-pay

## 2020-03-12 ENCOUNTER — Emergency Department (HOSPITAL_BASED_OUTPATIENT_CLINIC_OR_DEPARTMENT_OTHER): Payer: PRIVATE HEALTH INSURANCE

## 2020-03-12 ENCOUNTER — Inpatient Hospital Stay (HOSPITAL_COMMUNITY): Payer: PRIVATE HEALTH INSURANCE | Admitting: Anesthesiology

## 2020-03-12 ENCOUNTER — Inpatient Hospital Stay (HOSPITAL_BASED_OUTPATIENT_CLINIC_OR_DEPARTMENT_OTHER)
Admission: EM | Admit: 2020-03-12 | Discharge: 2020-03-14 | DRG: 377 | Disposition: A | Discharge status: Home / Self Care | Payer: PRIVATE HEALTH INSURANCE | Attending: Family Medicine | Admitting: Family Medicine

## 2020-03-12 DIAGNOSIS — R578 Other shock: Secondary | ICD-10-CM | POA: Diagnosis present

## 2020-03-12 DIAGNOSIS — Z803 Family history of malignant neoplasm of breast: Secondary | ICD-10-CM | POA: Diagnosis not present

## 2020-03-12 DIAGNOSIS — L4050 Arthropathic psoriasis, unspecified: Secondary | ICD-10-CM | POA: Diagnosis present

## 2020-03-12 DIAGNOSIS — E1140 Type 2 diabetes mellitus with diabetic neuropathy, unspecified: Secondary | ICD-10-CM | POA: Diagnosis present

## 2020-03-12 DIAGNOSIS — Z96649 Presence of unspecified artificial hip joint: Secondary | ICD-10-CM | POA: Diagnosis present

## 2020-03-12 DIAGNOSIS — Z6833 Body mass index (BMI) 33.0-33.9, adult: Secondary | ICD-10-CM

## 2020-03-12 DIAGNOSIS — Z7984 Long term (current) use of oral hypoglycemic drugs: Secondary | ICD-10-CM | POA: Diagnosis not present

## 2020-03-12 DIAGNOSIS — D62 Acute posthemorrhagic anemia: Secondary | ICD-10-CM | POA: Diagnosis present

## 2020-03-12 DIAGNOSIS — Z20822 Contact with and (suspected) exposure to covid-19: Secondary | ICD-10-CM | POA: Diagnosis present

## 2020-03-12 DIAGNOSIS — Y848 Other medical procedures as the cause of abnormal reaction of the patient, or of later complication, without mention of misadventure at the time of the procedure: Secondary | ICD-10-CM | POA: Diagnosis not present

## 2020-03-12 DIAGNOSIS — D75839 Thrombocytosis, unspecified: Secondary | ICD-10-CM

## 2020-03-12 DIAGNOSIS — K284 Chronic or unspecified gastrojejunal ulcer with hemorrhage: Principal | ICD-10-CM | POA: Diagnosis present

## 2020-03-12 DIAGNOSIS — M1990 Unspecified osteoarthritis, unspecified site: Secondary | ICD-10-CM | POA: Diagnosis present

## 2020-03-12 DIAGNOSIS — Z888 Allergy status to other drugs, medicaments and biological substances status: Secondary | ICD-10-CM

## 2020-03-12 DIAGNOSIS — I10 Essential (primary) hypertension: Secondary | ICD-10-CM | POA: Diagnosis present

## 2020-03-12 DIAGNOSIS — I9589 Other hypotension: Secondary | ICD-10-CM

## 2020-03-12 DIAGNOSIS — R197 Diarrhea, unspecified: Secondary | ICD-10-CM | POA: Diagnosis present

## 2020-03-12 DIAGNOSIS — I959 Hypotension, unspecified: Secondary | ICD-10-CM | POA: Diagnosis present

## 2020-03-12 DIAGNOSIS — K920 Hematemesis: Secondary | ICD-10-CM

## 2020-03-12 DIAGNOSIS — Z800 Family history of malignant neoplasm of digestive organs: Secondary | ICD-10-CM

## 2020-03-12 DIAGNOSIS — Z8042 Family history of malignant neoplasm of prostate: Secondary | ICD-10-CM

## 2020-03-12 DIAGNOSIS — T8092XA Unspecified transfusion reaction, initial encounter: Secondary | ICD-10-CM | POA: Diagnosis not present

## 2020-03-12 DIAGNOSIS — D509 Iron deficiency anemia, unspecified: Secondary | ICD-10-CM | POA: Diagnosis present

## 2020-03-12 DIAGNOSIS — Z7982 Long term (current) use of aspirin: Secondary | ICD-10-CM | POA: Diagnosis not present

## 2020-03-12 DIAGNOSIS — E861 Hypovolemia: Secondary | ICD-10-CM | POA: Diagnosis not present

## 2020-03-12 DIAGNOSIS — Z8041 Family history of malignant neoplasm of ovary: Secondary | ICD-10-CM

## 2020-03-12 DIAGNOSIS — Z9884 Bariatric surgery status: Secondary | ICD-10-CM

## 2020-03-12 DIAGNOSIS — I341 Nonrheumatic mitral (valve) prolapse: Secondary | ICD-10-CM | POA: Diagnosis present

## 2020-03-12 DIAGNOSIS — K922 Gastrointestinal hemorrhage, unspecified: Secondary | ICD-10-CM | POA: Diagnosis not present

## 2020-03-12 DIAGNOSIS — Z79899 Other long term (current) drug therapy: Secondary | ICD-10-CM

## 2020-03-12 HISTORY — DX: Type 2 diabetes mellitus without complications: E11.9

## 2020-03-12 HISTORY — PX: HOT HEMOSTASIS: SHX5433

## 2020-03-12 HISTORY — DX: Anemia, unspecified: D64.9

## 2020-03-12 HISTORY — PX: ESOPHAGOGASTRODUODENOSCOPY: SHX1529

## 2020-03-12 HISTORY — PX: ESOPHAGOGASTRODUODENOSCOPY (EGD) WITH PROPOFOL: SHX5813

## 2020-03-12 HISTORY — DX: Benign neoplasm of connective and other soft tissue, unspecified: D21.9

## 2020-03-12 LAB — COMPREHENSIVE METABOLIC PANEL
ALT: 24 U/L (ref 0–44)
AST: 20 U/L (ref 15–41)
Albumin: 3.1 g/dL — ABNORMAL LOW (ref 3.5–5.0)
Alkaline Phosphatase: 42 U/L (ref 38–126)
Anion gap: 11 (ref 5–15)
BUN: 36 mg/dL — ABNORMAL HIGH (ref 6–20)
CO2: 24 mmol/L (ref 22–32)
Calcium: 7.8 mg/dL — ABNORMAL LOW (ref 8.9–10.3)
Chloride: 99 mmol/L (ref 98–111)
Creatinine, Ser: 0.64 mg/dL (ref 0.44–1.00)
GFR, Estimated: >60 mL/min (ref >60)
Glucose, Bld: 229 mg/dL — ABNORMAL HIGH (ref 70–99)
Potassium: 5.3 mmol/L — ABNORMAL HIGH (ref 3.5–5.1)
Sodium: 134 mmol/L — ABNORMAL LOW (ref 135–145)
Total Bilirubin: 0.4 mg/dL (ref 0.3–1.2)
Total Protein: 6 g/dL — ABNORMAL LOW (ref 6.5–8.1)

## 2020-03-12 LAB — CBC
HCT: 30.1 % — ABNORMAL LOW (ref 36.0–46.0)
HCT: 23.9 % — ABNORMAL LOW (ref 36.0–46.0)
Hemoglobin: 8.7 g/dL — ABNORMAL LOW (ref 12.0–15.0)
Hemoglobin: 7.2 g/dL — ABNORMAL LOW (ref 12.0–15.0)
MCH: 32.8 pg (ref 26.0–34.0)
MCH: 34.4 pg — ABNORMAL HIGH (ref 26.0–34.0)
MCHC: 28.9 g/dL — ABNORMAL LOW (ref 30.0–36.0)
MCHC: 30.1 g/dL (ref 30.0–36.0)
MCV: 113.6 fL — ABNORMAL HIGH (ref 80.0–100.0)
MCV: 114.4 fL — ABNORMAL HIGH (ref 80.0–100.0)
Platelets: 505 10*3/uL — ABNORMAL HIGH (ref 150–400)
Platelets: 369 10*3/uL (ref 150–400)
RBC: 2.65 MIL/uL — ABNORMAL LOW (ref 3.87–5.11)
RBC: 2.09 MIL/uL — ABNORMAL LOW (ref 3.87–5.11)
RDW: 16.4 % — ABNORMAL HIGH (ref 11.5–15.5)
RDW: 16.3 % — ABNORMAL HIGH (ref 11.5–15.5)
WBC: 9.6 10*3/uL (ref 4.0–10.5)
WBC: 7.5 10*3/uL (ref 4.0–10.5)
nRBC: 0 % (ref 0.0–0.2)
nRBC: 0 % (ref 0.0–0.2)

## 2020-03-12 LAB — HCG, QUANTITATIVE, PREGNANCY: hCG, Beta Chain, Quant, S: 4 m[IU]/mL (ref <5)

## 2020-03-12 LAB — PROTIME-INR
INR: 1 (ref 0.8–1.2)
Prothrombin Time: 13 seconds (ref 11.4–15.2)

## 2020-03-12 LAB — MRSA PCR SCREENING: MRSA by PCR: NEGATIVE

## 2020-03-12 LAB — RESPIRATORY PANEL BY RT PCR (FLU A&B, COVID)
Influenza A by PCR: NEGATIVE
Influenza B by PCR: NEGATIVE
SARS Coronavirus 2 by RT PCR: NEGATIVE

## 2020-03-12 LAB — IRON AND TIBC
Iron: 64 ug/dL (ref 28–170)
Saturation Ratios: 26 % (ref 10.4–31.8)
TIBC: 242 ug/dL — ABNORMAL LOW (ref 250–450)
UIBC: 178 ug/dL

## 2020-03-12 LAB — FERRITIN: Ferritin: 170 ng/mL (ref 11–307)

## 2020-03-12 LAB — GLUCOSE, CAPILLARY: Glucose-Capillary: 151 mg/dL — ABNORMAL HIGH (ref 70–99)

## 2020-03-12 LAB — HIV ANTIBODY (ROUTINE TESTING W REFLEX): HIV Screen 4th Generation wRfx: NONREACTIVE

## 2020-03-12 LAB — OCCULT BLOOD X 1 CARD TO LAB, STOOL: Fecal Occult Bld: POSITIVE — AB

## 2020-03-12 SURGERY — ESOPHAGOGASTRODUODENOSCOPY (EGD) WITH PROPOFOL
Anesthesia: Monitor Anesthesia Care

## 2020-03-12 MED ORDER — POLYETHYLENE GLYCOL 3350 17 G PO PACK: 17.0000 g | PACK | Freq: Every day | ORAL | Status: DC | PRN

## 2020-03-12 MED ORDER — SODIUM CHLORIDE 0.9 % IV BOLUS
2000.0000 mL | Freq: Once | INTRAVENOUS | Status: AC
Start: 2020-03-12 — End: 2020-03-12
  Administered 2020-03-12: 2000 mL via INTRAVENOUS

## 2020-03-12 MED ORDER — SUCRALFATE 1 GM/10ML PO SUSP
1.0000 g | Freq: Three times a day (TID) | ORAL | Status: DC
  Administered 2020-03-12 – 2020-03-14 (×7): 1 g via ORAL
  Filled 2020-03-12 (×7): qty 10

## 2020-03-12 MED ORDER — GLUCAGON HCL RDNA (DIAGNOSTIC) 1 MG IJ SOLR
2.0000 mg | Freq: Once | INTRAMUSCULAR | Status: AC
  Administered 2020-03-12: 2 mg via INTRAVENOUS
  Filled 2020-03-12: qty 2

## 2020-03-12 MED ORDER — SODIUM CHLORIDE 0.9 % IV SOLN
8.0000 mg/h | INTRAVENOUS | Status: DC
  Administered 2020-03-12 – 2020-03-13 (×3): 8 mg/h via INTRAVENOUS
  Filled 2020-03-12 (×7): qty 80

## 2020-03-12 MED ORDER — DOCUSATE SODIUM 100 MG PO CAPS: 100.0000 mg | ORAL_CAPSULE | Freq: Two times a day (BID) | ORAL | Status: DC | PRN

## 2020-03-12 MED ORDER — ONDANSETRON HCL 4 MG/2ML IJ SOLN
4.0000 mg | Freq: Once | INTRAMUSCULAR | Status: AC
  Administered 2020-03-12: 4 mg via INTRAVENOUS
  Filled 2020-03-12: qty 2

## 2020-03-12 MED ORDER — PHENYLEPHRINE 40 MCG/ML (10ML) SYRINGE FOR IV PUSH (FOR BLOOD PRESSURE SUPPORT)
PREFILLED_SYRINGE | INTRAVENOUS | Status: DC | PRN
  Administered 2020-03-12 (×2): 80 ug via INTRAVENOUS

## 2020-03-12 MED ORDER — EPINEPHRINE 1 MG/10ML IJ SOSY
PREFILLED_SYRINGE | INTRAMUSCULAR | Status: AC
  Filled 2020-03-12: qty 10

## 2020-03-12 MED ORDER — SODIUM CHLORIDE 0.9 % IV BOLUS
1000.0000 mL | Freq: Once | INTRAVENOUS | Status: AC
  Administered 2020-03-12: 1000 mL via INTRAVENOUS

## 2020-03-12 MED ORDER — PROPOFOL 500 MG/50ML IV EMUL
INTRAVENOUS | Status: DC | PRN
  Administered 2020-03-12: 75 ug/kg/min via INTRAVENOUS

## 2020-03-12 MED ORDER — SODIUM CHLORIDE 0.9 % IV SOLN
80.0000 mg | Freq: Once | INTRAVENOUS | Status: AC
  Administered 2020-03-12: 80 mg via INTRAVENOUS
  Filled 2020-03-12 (×2): qty 80

## 2020-03-12 MED ORDER — CHLORHEXIDINE GLUCONATE CLOTH 2 % EX PADS
6.0000 | MEDICATED_PAD | Freq: Every day | CUTANEOUS | Status: DC
  Administered 2020-03-12 – 2020-03-14 (×3): 6 via TOPICAL

## 2020-03-12 MED ORDER — SODIUM CHLORIDE 0.9 % IV SOLN: INTRAVENOUS | Status: DC | PRN

## 2020-03-12 MED ORDER — PANTOPRAZOLE SODIUM 40 MG IV SOLR: 40.0000 mg | Freq: Two times a day (BID) | INTRAVENOUS | Status: DC

## 2020-03-12 MED ORDER — LIDOCAINE 2% (20 MG/ML) 5 ML SYRINGE
INTRAMUSCULAR | Status: DC | PRN
  Administered 2020-03-12: 60 mg via INTRAVENOUS

## 2020-03-12 MED ORDER — SODIUM CHLORIDE 0.9 % IV BOLUS
500.0000 mL | Freq: Once | INTRAVENOUS | Status: AC
  Administered 2020-03-12: 500 mL via INTRAVENOUS

## 2020-03-12 SURGICAL SUPPLY — 14 items

## 2020-03-12 NOTE — Anesthesia Procedure Notes (Signed)
Procedure Name: MAC Date/Time: 03/12/2020 2:19 PM Performed by: Kyung Rudd, CRNA Pre-anesthesia Checklist: Patient identified, Emergency Drugs available, Suction available, Patient being monitored and Timeout performed Patient Re-evaluated:Patient Re-evaluated prior to induction Oxygen Delivery Method: Nasal cannula Preoxygenation: Pre-oxygenation with 100% oxygen Induction Type: IV induction Placement Confirmation: positive ETCO2 Dental Injury: Teeth and Oropharynx as per pre-operative assessment

## 2020-03-12 NOTE — ED Provider Notes (Addendum)
[facility] EMERGENCY DEPARTMENT Provider Note   CSN: [id number] Arrival date & time: 03/12/20  0325     History Chief Complaint  Patient presents with  . Hematemesis  . Rectal Bleeding    Leah Franco is a 54 y.o. female.  The history is provided by the patient.  Rectal Bleeding Quality:  Black and tarry Amount:  Moderate Timing:  Intermittent Chronicity:  Recurrent Context: not anal fissures, not diarrhea and not rectal pain   Similar prior episodes: yes   Relieved by:  Nothing Worsened by:  Nothing Ineffective treatments:  None tried Associated symptoms: hematemesis and vomiting   Associated symptoms: no abdominal pain, no dizziness, no fever and no loss of consciousness   Risk factors: no anticoagulant use   Patient with a h/o gastric bypass and a h/o GIB with unknown source on endoscopy and colonoscopy presents with 1 day of hematemesis with 6 total episodes (has brought a tupperware container with some in it) and then melena x 1 starting this evening.  Patient did not pass out but this evening felt globally weak and fell off the toilet.  No f/c/r.  No abdominal pain.       Past Medical History:  Diagnosis Date  . Arthritis   . Back pain   . Fatigue   . Hemorrhoid   . Hypertension   . MRSA (methicillin resistant Staphylococcus aureus)   . Night sweats   . Wound infection     Patient Active Problem List   Diagnosis Date Noted  . Arterial insufficiency ([place]) 11/25/2010  . MRSA (methicillin resistant Staphylococcus aureus) 11/25/2010  . COPD (chronic obstructive pulmonary disease) ([place]) 11/25/2010  . CHF (congestive heart failure) ([place]) 11/25/2010  . Spinal cord injury 11/25/2010  . CAD (coronary artery disease) 11/25/2010  . CVA (cerebral infarction) 11/25/2010  . TIA (transient ischemic attack) 11/25/2010  . Diabetes mellitus 11/25/2010  . Neuropathy 11/25/2010  . End stage renal disease ([place]) 11/25/2010  . Dialysis care 11/25/2010  . Lupus  ([place]) 11/25/2010  . Lymphedema 11/25/2010  . Scoliosis 11/25/2010  . Abscess 11/25/2010    Past Surgical History:  Procedure Laterality Date  . BACK SURGERY    . BREAST LUMPECTOMY     lft  . GASTRIC BYPASS    . HERNIA REPAIR    . SPINE SURGERY     corrective spine collapsed, scoliosis and scoliosis revision     OB History   No obstetric history on file.     Family History  Problem Relation Age of Onset  . BRCA 1/2 Mother        BRCA2+ by default, no testing  . Breast cancer Sister 16       BRCA2+  . BRCA 1/2 Sister 55       BRCA2 +  . Pancreatic cancer Maternal Uncle        Dx in his 53s  . BRCA 1/2 Maternal Uncle        BRCA2+  . Prostate cancer Maternal Grandfather   . Breast cancer Maternal Aunt        dx in 50s  . Ovarian cancer Maternal Aunt        dx in 71s  . Esophageal cancer Maternal Uncle 62  . BRCA 1/2 Maternal Uncle        BRCA2+  . Colon cancer Maternal Uncle 61  . Breast cancer Cousin        dx in her 37s  . BRCA 1/2  Cousin        dx in her 57s; BRCA2+    Social History   Tobacco Use  . Smoking status: Never Smoker  . Smokeless tobacco: Never Used  Vaping Use  . Vaping Use: Never used  Substance Use Topics  . Alcohol use: No    Comment: rare  . Drug use: No    Home Medications Prior to Admission medications   Medication Sig Start Date End Date Taking? Authorizing Provider  Calcium Carbonate-Vitamin D (CALCIUM + D PO) Take by mouth.      [provider]  Cholecalciferol (VITAMIN D) 2000 UNITS tablet Take 2,000 Units by mouth 4 (four) times daily.      [provider]  ergocalciferol (VITAMIN D2) 50000 UNITS capsule Take 50,000 Units by mouth 2 (two) times a week.      [provider]  fish oil-omega-3 fatty acids 1000 MG capsule Take by mouth 3 (three) times daily.      [provider]  Flaxseed, Linseed, (FLAX SEED OIL PO) Take 1,300 mg by mouth 3 (three) times daily.      [provider]   Multiple Vitamin (MULTIVITAMIN) capsule Take 1 capsule by mouth 3 (three) times daily.      [provider]  NIACIN, ANTIHYPERLIPIDEMIC, PO Take by mouth 3 (three) times daily.      [provider]  NORTRIPTYLINE HCL PO Take 150 mg by mouth daily.      [provider]  oxyCODONE-acetaminophen (PERCOCET) 10-325 MG per tablet Take 1 tablet by mouth 4 (four) times daily.      [provider]  raloxifene (EVISTA) 60 MG tablet Take by mouth daily.      [provider]  sulfamethoxazole-trimethoprim (BACTRIM DS) 800-160 MG per tablet BID times 48H. 11/25/10   [provider]  vitamin E 400 UNIT capsule Take 400 Units by mouth 3 (three) times daily.      [provider]    Allergies    Tape  Review of Systems   Review of Systems  Constitutional: Negative for fever.  HENT: Negative for congestion.   Eyes: Negative for visual disturbance.  Respiratory: Negative for shortness of breath.   Cardiovascular: Negative for chest pain.  Gastrointestinal: Positive for anal bleeding, blood in stool, hematemesis, hematochezia, nausea and vomiting. Negative for abdominal pain.  Genitourinary: Negative for difficulty urinating.  Musculoskeletal: Negative for arthralgias.  Skin: Negative for color change.  Neurological: Negative for dizziness and loss of consciousness.  Psychiatric/Behavioral: Negative for agitation.  All other systems reviewed and are negative.   Physical Exam Updated Vital Signs BP (!) 72/53 (BP Location: Left Arm)   Pulse 86   Temp 97.8 F (36.6 C) (Oral)   Resp 16   Ht _0  (1.575 m)   Wt 85.7 kg   LMP 11/04/2010   SpO2 100%   BMI 34.57 kg/m   Physical Exam Vitals and nursing note reviewed. Exam conducted with a chaperone present.  Constitutional:      General: She is not in acute distress.    Appearance: Normal appearance.  HENT:     Head: Normocephalic and atraumatic.     Nose: Nose normal.  Eyes:      Conjunctiva/sclera: Conjunctivae normal.     Pupils: Pupils are equal, round, and reactive to light.  Cardiovascular:     Rate and Rhythm: Normal rate and regular rhythm.     Pulses: Normal pulses.     Heart  sounds: Normal heart sounds.  Pulmonary:     Effort: Pulmonary effort is normal.     Breath sounds: Normal breath sounds.  Abdominal:     General: Abdomen is flat. Bowel sounds are normal.     Palpations: Abdomen is soft.     Tenderness: There is no abdominal tenderness. There is no guarding.  Genitourinary:    Rectum: Guaiac result positive.  Musculoskeletal:        General: Normal range of motion.     Cervical back: Normal range of motion and neck supple.  Skin:    General: Skin is warm and dry.     Capillary Refill: Capillary refill takes less than 2 seconds.     Coloration: Skin is pale.  Neurological:     General: No focal deficit present.     Mental Status: She is alert and oriented to person, place, and time.     Deep Tendon Reflexes: Reflexes normal.  Psychiatric:        Mood and Affect: Mood normal.        Behavior: Behavior normal.     ED Results / Procedures / Treatments   Labs (all labs ordered are listed, but only abnormal results are displayed) Labs Reviewed  CBC - Abnormal; Notable for the following components:      Result Value   RBC 2.65 (*)    Hemoglobin 8.7 (*)    HCT 30.1 (*)    MCV 113.6 (*)    MCHC 28.9 (*)    RDW 16.4 (*)    Platelets 505 (*)    All other components within normal limits  COMPREHENSIVE METABOLIC PANEL  OCCULT BLOOD X 1 CARD TO LAB, STOOL  POC OCCULT BLOOD, ED    EKG None  Radiology No results found.  Procedures Procedures (including critical care time)  Medications Ordered in ED Medications  pantoprazole (PROTONIX) 80 mg in sodium chloride 0.9 % 100 mL IVPB (has no administration in time range)  pantoprazole (PROTONIX) 80 mg in sodium chloride 0.9 % 100 mL (0.8 mg/mL) infusion (has no administration in time  range)  pantoprazole (PROTONIX) injection 40 mg (has no administration in time range)  sodium chloride 0.9 % bolus 2,000 mL (2,000 mLs Intravenous New Bag/Given 03/12/20 0411)    ED Course  I have reviewed the triage vital signs and the nursing notes.  Pertinent labs & imaging results that were available during my care of the patient were reviewed by me and considered in my medical decision making (see chart for details).   Called [facility] as patient gets her iron infusions there, but there are no beds.  Patient has agreed to admission at [facility].     MDM Reviewed: previous chart, nursing note and vitals Reviewed previous: labs Interpretation: labs and x-ray (NACPD by me on CXR, anemia at 8.7 with high MCV and platelet elevation ) Total time providing critical care: 75-105 minutes (IVF resuscitation and protonix bolus and drip ). This excludes time spent performing separately reportable procedures and services. Consults: critical care and gastrointestinal (secure message sent to Dr. Cristina Gong case d/w Dr. Corinna Lines of [facility] )  CRITICAL CARE Performed by: Marrion Accomando K Kyaira Trantham-Rasch Total critical care time: 75  minutes Critical care time was exclusive of separately billable procedures and treating other patients. Critical care was necessary to treat or prevent imminent or life-threatening deterioration. Critical care was time spent personally by me on the following activities: development of treatment plan with patient and/or surrogate as well as  nursing, discussions with consultants, evaluation of patient's response to treatment, examination of patient, obtaining history from patient or surrogate, ordering and performing treatments and interventions, ordering and review of laboratory studies, ordering and review of radiographic studies, pulse oximetry and re-evaluation of patient's condition.  Final Clinical Impression(s) / ED Diagnoses Final diagnoses:  None    I have reviewed outside records and found  that the patient is taking metoprolol BID and this would explain why the patient is not tachycardiac.    Admit to ICU at [place], Jocelyn Nold, MD 03/12/20 [place], Kataleena Holsapple, MD 03/12/20 6203

## 2020-03-12 NOTE — H&P (Signed)
NAME:  Leah Franco, MRN:  [id number], DOB:  1966-01-03, LOS: 0 ADMISSION DATE:  03/12/2020, CONSULTATION DATE:  03/12/2020 REFERRING MD:  Dr. Randal Buba, CHIEF COMPLAINT:  GI bleed    Brief History   54 year old female with prior history of gastric bypass surgery presents with complaints of emesis and melena.  Chart review reveals patient was hypotensive with blood pressure 71/53 on admission.  Improved with fluid bolus.  Given hypotension PCCM was consulted for further management  History of present illness   Leah Franco is a 54 year old female with a past medical history significant for hypertension, type 2 diabetes, arthritis, and prior gastric bypass surgery.  Upon chart review it appears patient has had over the past year.  She is followed by outpatient gastroenterology for EGD, colonoscopy, CT scan.  She, dizziness, nausea at home.  Per chart review it appears patient was initially hypotensive with a blood pressure of 79/52.  All other vital signs within normal limits.  Significant lab work includes NA 134, K5.3, glucose 229, albumin 4.9, hemoglobin 8.7, hematocrit 30.1.  Fecal occult stool positive at [facility]  Past Medical History  Type 2 diabetes Osteoporosis Vitamin D deficiency Vitamin B12 deficiency Iron deficiency Hypertension  Prior history of gastric bypass Chronic pain Hyperlipidemia  Significant Hospital Events   Transferred from [place] to for admission 11/3  Consults:  GI  Procedures:    Significant Diagnostic Tests:  Chest x-ray 11/30 > negative  Micro Data:  COVID 11/30 > negative MRSA PCR 11/3 > negative  Antimicrobials:    Interim history/subjective:  Patient is seen lying in bed lightheadedness weakness improved after fluid bolus.  Objective   Blood pressure 90/64, pulse 82, temperature 98.1 F (36.7 C), temperature source Oral, resp. rate 16, height 5' 2"  (1.575 m), weight 84.4 kg, last menstrual period 11/04/2010, SpO2  100 %.       No intake or output data in the 24 hours ending 03/12/20 0724 Filed Weights   03/12/20 0336 03/12/20 0659  Weight: 85.7 kg 84.4 kg    Examination: General: Pleasant middle-aged female lying in bed in no acute distress HEENT: [place]/AT, MM pink/moist, PERRL,  Neuro: Alert and oriented x4, nonfocal CV: s1s2 regular rate and rhythm, no murmur, rubs, or gallops,  PULM: Clear to auscultation bilaterally no added breath sounds, no increased work of breathing GI: soft, bowel sounds active in all 4 quadrants, non-tender, non-distended,  Extremities: warm/dry, no edema  Skin: no rashes or lesions  Resolved Hospital Problem list     Assessment & Plan:  Melena and hematemesis -Patient reports recurrent episodes of both hematemesis and melena.  She reports this has been ongoing issue for the past year  She has underwent EGD, colonoscopy, and abdominal CT scan all of which have been negative this year.  She is followed by GI P: Continue IV hydration Consult GI Can consider vasopressor support if patient becomes hemodynamically unstable MAP goal of > 65 Trend CBC every 8 hours Identify and treat course  Type and screen Obtain blood consent NPO  Maintain 2 large bore PIVs Continue IV Protonix drip  Hold any anticoagulation or NSAIDs    Hypertension -Home medications include Lopressor P: Hold home antihypertensives in the setting of GI bleed Monitor blood pressure closely in the ICU setting  History of gastric bypass -Approximately 20 years ago patient has suffered from vitamin deficiency and iron deficiency for many years P: Supportive care Nutritional and supplementation  Vitamin D  deficiency Vitamin B12 deficiency Iron deficiency -In the setting of prior gastric bypass surgery.  Home medications include supplemental vitamin D and vitamin E P: Await medication reconciliation and resume home multivitamin regiment Dietary consult Obtain iron panel   Best  practice:  Diet: NPO Pain/Anxiety/Delirium protocol (if indicated): PRN's VAP protocol (if indicated): N/A DVT prophylaxis: SCD GI prophylaxis: PPI drip Glucose control: SSI Mobility: Bedrest with bathroom privileges  Code Status: Full Family Communication: None at bedside  Disposition: ICU with likely transfer to progressive   Labs   CBC: Recent Labs  Lab 03/12/20 0411  WBC 9.6  HGB 8.7*  HCT 30.1*  MCV 113.6*  PLT 505*    Basic Metabolic Panel: Recent Labs  Lab 03/12/20 0411  NA 134*  K 5.3*  CL 99  CO2 24  GLUCOSE 229*  BUN 36*  CREATININE 0.64  CALCIUM 7.8*   GFR: Estimated Creatinine Clearance: 82.9 mL/min (by C-G formula based on SCr of 0.64 mg/dL). Recent Labs  Lab 03/12/20 0411  WBC 9.6    Liver Function Tests: Recent Labs  Lab 03/12/20 0411  AST 20  ALT 24  ALKPHOS 42  BILITOT 0.4  PROT 6.0*  ALBUMIN 3.1*   No results for input(s): LIPASE, AMYLASE in the last 168 hours. No results for input(s): AMMONIA in the last 168 hours.  ABG No results found for: PHART, PCO2ART, PO2ART, HCO3, TCO2, ACIDBASEDEF, O2SAT   Coagulation Profile: Recent Labs  Lab 03/12/20 0411  INR 1.0    Cardiac Enzymes: No results for input(s): CKTOTAL, CKMB, CKMBINDEX, TROPONINI in the last 168 hours.  HbA1C: No results found for: HGBA1C  CBG: Recent Labs  Lab 03/12/20 0657  GLUCAP 151*    Review of Systems: Positive in bold  Gen: Denies fever, chills, weight change, fatigue, night sweats HEENT: Denies blurred vision, double vision, hearing loss, tinnitus, sinus congestion, rhinorrhea, sore throat, neck stiffness, dysphagia PULM: Denies shortness of breath, cough, sputum production, hemoptysis, wheezing CV: Denies chest pain, edema, orthopnea, paroxysmal nocturnal dyspnea, palpitations GI: Denies abdominal pain, nausea, vomiting, diarrhea, hematochezia, melena, constipation, change in bowel habits GU: Denies dysuria, hematuria, polyuria, oliguria,  urethral discharge Endocrine: Denies hot or cold intolerance, polyuria, polyphagia or appetite change Derm: Denies rash, dry skin, scaling or peeling skin change Heme: Denies easy bruising, bleeding, bleeding gums Neuro: Denies headache,diszziness, numbness, weakness, slurred speech, loss of memory or consciousness  Past Medical History  She,  has a past medical history of Anemia, Arthritis, Back pain, Diabetes mellitus without complication (HCC), Fatigue, Fibroids, Hemorrhoid, Hypertension, Mitral valve prolapse, MRSA (methicillin resistant Staphylococcus aureus), Night sweats, and Wound infection.   Surgical History    Past Surgical History:  Procedure Laterality Date  . BACK SURGERY    . BREAST LUMPECTOMY     lft  . GASTRIC BYPASS    . HERNIA REPAIR    . SPINE SURGERY     corrective spine collapsed, scoliosis and scoliosis revision  . TOTAL HIP ARTHROPLASTY  2018     Social History   reports that she has never smoked. She has never used smokeless tobacco. She reports that she does not drink alcohol and does not use drugs.   Family History   Her family history includes BRCA 1/2 in her cousin, maternal uncle, maternal uncle, and mother; BRCA 1/2 (age of onset: 28) in her sister; Breast cancer in her cousin and maternal aunt; Breast cancer (age of onset: 108) in her sister; Colon cancer (age of onset: 31) in  her maternal uncle; Esophageal cancer (age of onset: 72) in her maternal uncle; Ovarian cancer in her maternal aunt; Pancreatic cancer in her maternal uncle; Prostate cancer in her maternal grandfather.   Allergies Allergies  Allergen Reactions  . Tape      Home Medications  Prior to Admission medications   Medication Sig Start Date End Date Taking? Authorizing Provider  gabapentin (NEURONTIN) 800 MG tablet Take 1 tablet by mouth every evening. 11/29/19 11/28/20 Yes [provider]  metFORMIN (GLUCOPHAGE) 500 MG tablet Take 2 tablets by mouth 2 (two) times daily.  04/05/16 01/14/21 Yes [provider]  metoprolol tartrate (LOPRESSOR) 25 MG tablet Take 1 tablet by mouth 2 (two) times daily. 03/06/20  Yes [provider]  Multiple Vitamin (MULTI-VITAMIN) tablet Take 1 tablet by mouth 3 (three) times daily. 01/15/11  Yes [provider]  Calcium Carbonate-Vitamin D (CALCIUM + D PO) Take by mouth.      [provider]  Cholecalciferol (VITAMIN D) 2000 UNITS tablet Take 2,000 Units by mouth 4 (four) times daily.      [provider]  Cholecalciferol 50 MCG (2000 UT) CAPS Take 1 capsule by mouth daily.    [provider]  ergocalciferol (VITAMIN D2) 50000 UNITS capsule Take 50,000 Units by mouth 2 (two) times a week.      [provider]  fish oil-omega-3 fatty acids 1000 MG capsule Take by mouth 3 (three) times daily.      [provider]  Flaxseed, Linseed, (FLAX SEED OIL PO) Take 1,300 mg by mouth 3 (three) times daily.      [provider]  Multiple Vitamin (MULTIVITAMIN) capsule Take 1 capsule by mouth 3 (three) times daily.      [provider]  NIACIN, ANTIHYPERLIPIDEMIC, PO Take by mouth 3 (three) times daily.      [provider]  NORTRIPTYLINE HCL PO Take 150 mg by mouth daily.      [provider]  oxyCODONE-acetaminophen (PERCOCET) 10-325 MG per tablet Take 1 tablet by mouth 4 (four) times daily.      [provider]  raloxifene (EVISTA) 60 MG tablet Take by mouth daily.      [provider]  sulfamethoxazole-trimethoprim (BACTRIM DS) 800-160 MG per tablet BID times 48H. 11/25/10   [provider]  vitamin E 400 UNIT capsule Take 400 Units by mouth 3 (three) times daily.      [provider]     Signature:   Johnsie Cancel, NP-C [facility] Contact / Pager information can be found on Amion  03/12/2020, 8:08 AM

## 2020-03-12 NOTE — ED Triage Notes (Addendum)
Pt states she started having blood in her emesis yesterday and tonight she started having blood in her stool  Pt has hx of same  Pt's visitor adds that the pt got very weak tonight and fell off the toilet  Pt states she hurt her back when she fell  No bruising noted on exam  Pt states she feels weak and light headed

## 2020-03-12 NOTE — Anesthesia Postprocedure Evaluation (Signed)
Anesthesia Post Note  Patient: Leah Franco  Procedure(s) Performed: ESOPHAGOGASTRODUODENOSCOPY (EGD) WITH PROPOFOL (N/A ) HOT HEMOSTASIS (ARGON PLASMA COAGULATION/BICAP) (N/A )     Patient location during evaluation: PACU Anesthesia Type: MAC Level of consciousness: awake and alert Pain management: pain level controlled Vital Signs Assessment: post-procedure vital signs reviewed and stable Respiratory status: spontaneous breathing, nonlabored ventilation, respiratory function stable and patient connected to nasal cannula oxygen Cardiovascular status: stable and blood pressure returned to baseline Postop Assessment: no apparent nausea or vomiting Anesthetic complications: no   No complications documented.  Last Vitals:  Vitals:   03/12/20 1510 03/12/20 1531  BP:  119/84  Pulse: 99 (!) 103  Resp:  16  Temp:  36.7 C  SpO2: 100% 100%    Last Pain:  Vitals:   03/12/20 1500  TempSrc:   PainSc: 3                  Effie Berkshire

## 2020-03-12 NOTE — Discharge Instructions (Signed)
Peptic Ulcer ° °A peptic ulcer is a painful sore in the lining of your stomach or the first part of your small intestine. °What are the causes? °Common causes of this condition include: °· An infection. °· Using certain pain medicines too often or too much. °What increases the risk? °You are more likely to get this condition if you: °· Smoke. °· Have a family history of ulcer disease. °· Drink alcohol. °· Have been hospitalized in an intensive care unit (ICU). °What are the signs or symptoms? °Symptoms include: °· Burning pain in the area between the chest and the belly button. The pain may: °? Not go away (be persistent). °? Be worse when your stomach is empty. °? Be worse at night. °· Heartburn. °· Feeling sick to your stomach (nauseous) and throwing up (vomiting). °· Bloating. °If the ulcer results in bleeding, it can cause you to: °· Have poop (stool) that is black and looks like tar. °· Throw up bright red blood. °· Throw up material that looks like coffee grounds. °How is this treated? °Treatment for this condition may include: °· Stopping things that can cause the ulcer, such as: °? Smoking. °? Using pain medicines. °· Medicines to reduce stomach acid. °· Antibiotic medicines if the ulcer is caused by an infection. °· A procedure that is done using a small, flexible tube that has a camera at the end (upper endoscopy). This may be done if you have a bleeding ulcer. °· Surgery. This may be needed if: °? You have a lot of bleeding. °? The ulcer caused a hole somewhere in the digestive system. °Follow these instructions at home: °· Do not drink alcohol if your doctor tells you not to drink. °· Limit how much caffeine you take in. °· Do not use any products that contain nicotine or tobacco, such as cigarettes, e-cigarettes, and chewing tobacco. If you need help quitting, ask your doctor. °· Take over-the-counter and prescription medicines only as told by your doctor. °? Do not stop or change your medicines unless  you talk with your doctor about it first. °? Do not take aspirin, ibuprofen, or other NSAIDs unless your doctor told you to do so. °· Keep all follow-up visits as told by your doctor. This is important. °Contact a doctor if: °· You do not get better in 7 days after you start treatment. °· You keep having an upset stomach (indigestion) or heartburn. °Get help right away if: °· You have sudden, sharp pain in your belly (abdomen). °· You have belly pain that does not go away. °· You have bloody poop (stool) or black, tarry poop. °· You throw up blood. It may look like coffee grounds. °· You feel light-headed or feel like you may pass out (faint). °· You get weak. °· You get sweaty or feel sticky and cold to the touch (clammy). °Summary °· Symptoms of a peptic ulcer include burning pain in the area between the chest and the belly button. °· Take medicines only as told by your doctor. °· Limit how much alcohol and caffeine you have. °· Keep all follow-up visits as told by your doctor. °This information is not intended to replace advice given to you by your health care provider. Make sure you discuss any questions you have with your health care provider. °Document Revised: 11/01/2017 Document Reviewed: 11/01/2017 °Elsevier Patient Education © 2020 [employer] ° °

## 2020-03-12 NOTE — Transfer of Care (Signed)
Immediate Anesthesia Transfer of Care Note  Patient: Leah Franco  Procedure(s) Performed: ESOPHAGOGASTRODUODENOSCOPY (EGD) WITH PROPOFOL (N/A ) HOT HEMOSTASIS (ARGON PLASMA COAGULATION/BICAP) (N/A )  Patient Location: Endoscopy Unit  Anesthesia Type:MAC  Level of Consciousness: awake, alert  and oriented  Airway & Oxygen Therapy: Patient Spontanous Breathing  Post-op Assessment: Report given to RN, Post -op Vital signs reviewed and stable and Patient moving all extremities X 4  Post vital signs: Reviewed and stable  Last Vitals:  Vitals Value Taken Time  BP    Temp    Pulse    Resp    SpO2      Last Pain:  Vitals:   03/12/20 1333  TempSrc: Temporal  PainSc: 6          Complications: No complications documented.

## 2020-03-12 NOTE — Anesthesia Preprocedure Evaluation (Addendum)
Anesthesia Evaluation  Patient identified by MRN, date of birth, ID band Patient awake    Reviewed: Allergy & Precautions, NPO status , Patient's Chart, lab work & pertinent test results  Airway Mallampati: III  TM Distance: >3 FB Neck ROM: Full    Dental  (+) Upper Dentures, Lower Dentures, Dental Advisory Given   Pulmonary COPD,    Pulmonary exam normal  (-) decreased breath sounds      Cardiovascular hypertension, Pt. on home beta blockers + CAD and +CHF  + Valvular Problems/Murmurs MVP  Rhythm:Regular Rate:Normal     Neuro/Psych TIAnegative psych ROS   GI/Hepatic negative GI ROS, Neg liver ROS,   Endo/Other  diabetes, Type 2, Oral Hypoglycemic Agents  Renal/GU      Musculoskeletal   Abdominal (+) - obese,   Peds  Hematology   Anesthesia Other Findings   Reproductive/Obstetrics                          Anesthesia Physical Anesthesia Plan  ASA: II  Anesthesia Plan: MAC   Post-op Pain Management:    Induction: Intravenous  PONV Risk Score and Plan: 0 and Propofol infusion  Airway Management Planned: Natural Airway and Simple Face Mask  Additional Equipment: None  Intra-op Plan:   Post-operative Plan:   Informed Consent:   Plan Discussed with: CRNA  Anesthesia Plan Comments:         Anesthesia Quick Evaluation

## 2020-03-12 NOTE — Consult Note (Signed)
Referring Provider: Merlene Laughter, NP Primary Care Physician:  [name], MD Primary Gastroenterologist: Althia Forts ([place])  Reason for Consultation: Coffee-ground emesis  HPI: Leah Franco is a 54 y.o. 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  Patient states she started experiencing coffee-ground emesis last night.  She had 5 or 6 episodes.  She denies any red blood in the emesis.  She also noted black, diarrheal stools last night.  She states she has had intermittent black, tarry stools over the last month or so.  Denies any hematochezia.  She denies any abdominal pain, dysphagia, GERD.  Patient states she has never had any prior episodes of GI bleeding or hematemesis, though has a history of iron deficiency anemia.  States that her appetite has been poor since November, and she thought that it was from [employer].  She lost about 40 pounds after initiating Ozempic.  She states her appetite has improved recently.  Takes 81 mg aspirin daily.  Denies other NSAID or blood thinner use.  Denies family history of colon cancer or gastrointestinal malignancy.  Reports a normal EGD/colonoscopy in July 2021 at [facility].  Procedures were completed for IDA.  Past Medical History:  Diagnosis Date  . Anemia   . Arthritis   . Back pain   . Diabetes mellitus without complication ([place])   . Fatigue   . Fibroids   . Hemorrhoid   . Hypertension   . Mitral valve prolapse   . MRSA (methicillin resistant Staphylococcus aureus)   . Night sweats   . Wound infection     Past Surgical History:  Procedure Laterality Date  . BACK SURGERY    . BREAST LUMPECTOMY     lft  . GASTRIC BYPASS    . HERNIA REPAIR    . SPINE SURGERY     corrective spine collapsed, scoliosis and scoliosis revision  . TOTAL HIP ARTHROPLASTY  2018    Prior to Admission medications   Medication Sig Start Date End Date  Taking? Authorizing Provider  gabapentin (NEURONTIN) 800 MG tablet Take 1 tablet by mouth every evening. 11/29/19 11/28/20 Yes [provider]  metFORMIN (GLUCOPHAGE) 500 MG tablet Take 2 tablets by mouth 2 (two) times daily. 04/05/16 01/14/21 Yes [provider]  metoprolol tartrate (LOPRESSOR) 25 MG tablet Take 1 tablet by mouth 2 (two) times daily. 03/06/20  Yes [provider]  Multiple Vitamin (MULTI-VITAMIN) tablet Take 1 tablet by mouth 3 (three) times daily. 01/15/11  Yes [provider]  Calcium Carbonate-Vitamin D (CALCIUM + D PO) Take by mouth.      [provider]  Cholecalciferol (VITAMIN D) 2000 UNITS tablet Take 2,000 Units by mouth 4 (four) times daily.      [provider]  Cholecalciferol 50 MCG (2000 UT) CAPS Take 1 capsule by mouth daily.    [provider]  ergocalciferol (VITAMIN D2) 50000 UNITS capsule Take 50,000 Units by mouth 2 (two) times a week.      [provider]  fish oil-omega-3 fatty acids 1000 MG capsule Take by mouth 3 (three) times daily.      [provider]  Flaxseed, Linseed, (FLAX SEED OIL PO) Take 1,300 mg by mouth 3 (three) times daily.      [provider]  Multiple Vitamin (MULTIVITAMIN) capsule Take 1 capsule by mouth 3 (three) times daily.      [provider]  NIACIN, ANTIHYPERLIPIDEMIC, PO Take by mouth 3 (three) times daily.      [provider]  NORTRIPTYLINE HCL PO Take 150 mg by mouth daily.      [provider]  oxyCODONE-acetaminophen (PERCOCET) 10-325 MG per tablet Take 1 tablet by mouth 4 (four) times daily.      [provider]  raloxifene (EVISTA) 60 MG tablet Take by mouth daily.      [provider]  sulfamethoxazole-trimethoprim (BACTRIM DS) 800-160 MG per tablet BID times 48H. 11/25/10   [provider]  vitamin E 400 UNIT capsule Take 400 Units by mouth 3 (three) times daily.      [provider]    Scheduled Meds: . Chlorhexidine Gluconate Cloth  6 each Topical Daily  . [START ON 03/15/2020] pantoprazole  40 mg Intravenous Q12H   Continuous Infusions: . pantoprozole (PROTONIX) infusion 8 mg/hr (03/12/20 0446)   PRN Meds:.docusate sodium, polyethylene glycol  Allergies as of 03/12/2020 - Review Complete 03/12/2020  Allergen Reaction Noted  . Tape  03/12/2020    Family History  Problem Relation Age of Onset  . BRCA 1/2 Mother        BRCA2+ by default, no testing  . Breast cancer Sister 65       BRCA2+  . BRCA 1/2 Sister 66       BRCA2 +  . Pancreatic cancer Maternal Uncle        Dx in his 57s  . BRCA 1/2 Maternal Uncle        BRCA2+  . Prostate cancer Maternal Grandfather   . Breast cancer Maternal Aunt        dx in 51s  . Ovarian cancer Maternal Aunt        dx in 43s  . Esophageal cancer Maternal Uncle 62  . BRCA 1/2 Maternal Uncle        BRCA2+  . Colon cancer Maternal Uncle 14  . Breast cancer Cousin        dx in her 12s  . BRCA 1/2 Cousin        dx in her 79s; BRCA2+    Social History   Socioeconomic History  . Marital status: Single    Spouse name: Not on file  . Number of children: 0  . Years of education: Not on file  . Highest education level: Not on file  Occupational History    Employer: [employer]  Tobacco Use  . Smoking status: Never Smoker  . Smokeless tobacco: Never Used  Vaping Use  . Vaping Use: Never used  Substance and Sexual Activity  . Alcohol use: No    Comment: rare  . Drug use: No  . Sexual activity: Never    Comment: declined condoms  Other Topics Concern  . Not on file  Social History Narrative  . Not on file   Social Determinants of Health   Financial Resource Strain:   . Difficulty of Paying Living Expenses: Not on file  Food Insecurity:   . Worried About Charity fundraiser in the Last Year: Not on file  . Ran Out of Food in the Last Year: Not on file  Transportation Needs:   . Lack of Transportation  (Medical): Not on file  . Lack of Transportation (Non-Medical): Not on file  Physical Activity:   . Days of Exercise per Week: Not on file  . Minutes of Exercise per Session: Not on file  Stress:   .  Feeling of Stress : Not on file  Social Connections:   . Frequency of Communication with Friends and Family: Not on file  . Frequency of Social Gatherings with Friends and Family: Not on file  . Attends Religious Services: Not on file  . Active Member of Clubs or Organizations: Not on file  . Attends Archivist Meetings: Not on file  . Marital Status: Not on file  Intimate Partner Violence:   . Fear of Current or Ex-Partner: Not on file  . Emotionally Abused: Not on file  . Physically Abused: Not on file  . Sexually Abused: Not on file    Review of Systems: Review of Systems  Constitutional: Positive for malaise/fatigue and weight loss. Negative for chills and fever.  HENT: Negative for hearing loss and tinnitus.   Eyes: Negative for pain and redness.  Respiratory: Positive for shortness of breath. Negative for cough.   Cardiovascular: Negative for chest pain and palpitations.  Gastrointestinal: Positive for diarrhea, melena, nausea and vomiting. Negative for abdominal pain, blood in stool, constipation and heartburn.  Genitourinary: Negative for flank pain.  Musculoskeletal: Negative for falls and joint pain.  Skin: Negative for itching and rash.  Neurological: Negative for seizures and loss of consciousness.  Endo/Heme/Allergies: Negative for polydipsia. Does not bruise/bleed easily.  Psychiatric/Behavioral: Negative for substance abuse. The patient is not nervous/anxious.      Physical Exam: Vital signs: Vitals:   03/12/20 0700 03/12/20 0800  BP: 90/64 92/69  Pulse: 82 93  Resp: 16 20  Temp:    SpO2: 100% 97%      Physical Exam Vitals reviewed.  Constitutional:      General: She is not in acute distress.    Appearance: She is overweight.  HENT:      Head: Normocephalic and atraumatic.     Nose: Nose normal.     Mouth/Throat:     Mouth: Mucous membranes are moist.     Pharynx: Oropharynx is clear.  Eyes:     General: No scleral icterus.    Extraocular Movements: Extraocular movements intact.     Comments: Conjunctival pallor  Cardiovascular:     Rate and Rhythm: Normal rate and regular rhythm.     Pulses: Normal pulses.     Heart sounds: Normal heart sounds.  Pulmonary:     Effort: Pulmonary effort is normal. No respiratory distress.     Breath sounds: Normal breath sounds.  Abdominal:     General: Bowel sounds are normal. There is no distension.     Palpations: Abdomen is soft. There is no mass.     Tenderness: There is no abdominal tenderness. There is no guarding or rebound.     Hernia: No hernia is present.  Musculoskeletal:        General: No swelling or tenderness.     Cervical back: Normal range of motion and neck supple.  Skin:    General: Skin is warm and dry.  Neurological:     General: No focal deficit present.     Mental Status: She is oriented to person, place, and time. She is lethargic.  Psychiatric:        Mood and Affect: Mood normal.        Behavior: Behavior normal. Behavior is cooperative.    GI:  Lab Results: Recent Labs    03/12/20 0411  WBC 9.6  HGB 8.7*  HCT 30.1*  PLT 505*   BMET Recent Labs    03/12/20 0411  NA 134*  K 5.3*  CL 99  CO2 24  GLUCOSE 229*  BUN 36*  CREATININE 0.64  CALCIUM 7.8*   LFT Recent Labs    03/12/20 0411  PROT 6.0*  ALBUMIN 3.1*  AST 20  ALT 24  ALKPHOS 42  BILITOT 0.4   PT/INR Recent Labs    03/12/20 0411  LABPROT 13.0  INR 1.0     Studies/Results: DG Chest Portable 1 View  Result Date: 03/12/2020 CLINICAL DATA:  Emesis.  Bloody stools. EXAM: PORTABLE CHEST 1 VIEW COMPARISON:  04/24/2009 FINDINGS: Mild cardiac enlargement. No pleural effusion or edema. Calcified granuloma identified in the left upper lobe. No airspace densities. Remote  right posterolateral rib deformity. Scoliosis with posterior rod fixation of the thoracolumbar spine. IMPRESSION: No acute cardiopulmonary abnormalities. Electronically Signed   By: Kerby Moors M.D.   On: 03/12/2020 05:18    Impression: Upper GI Bleeding (coffee ground emesis).  Differential includes PUD/anastomotic ulcer versus gastritis. -Hemoglobin 8.7 this morning, stable as compared to 8.7 as of 02/07/2020; however, patient's hemoglobin was 11.1 on 01/10/2020 -BUN elevated to 36 with creatinine 0.64, suggestive of upper GI bleeding -81 mg ASA daily, no other NSAID use  History of gastric bypass (~2002)  DM type II  Plan: EGD today.  I thoroughly discussed the procedure with the patient to include nature, alternatives, benefits, and risks (including but not limited to bleeding, infection, perforation, anesthesia/cardiac and pulmonary complications).  Patient verbalized understanding and gave verbal consent to proceed with EGD.  Continue Protonix drip for now.  Continue to monitor H&H with transfusion as needed to maintain hemoglobin greater than 7.  [facility] will follow.   LOS: 0 days   Salley Slaughter  PA-C 03/12/2020, 8:45 AM  Contact #  [phone]

## 2020-03-12 NOTE — ED Notes (Signed)
[facility] arrived for transport

## 2020-03-12 NOTE — Op Note (Signed)
[facility] Patient Name: Leah Franco Procedure Date : 03/12/2020 MRN: [id number] Attending MD: Clarene Essex , MD Date of Birth: [birth year] CSN: [id number] Age: 54 Admit Type: Inpatient Procedure:                Upper GI endoscopy Indications:              Iron deficiency anemia, Hematemesis Providers:                Clarene Essex, MD, Elmer Ramp. Tilden Dome, RN, Tyna Jaksch                            Technician Referring MD:              Medicines:                Propofol total dose 170 mg IV, 60 mg IV lidocaine Complications:            No immediate complications. Estimated Blood Loss:     Estimated blood loss: none. Procedure:                Pre-Anesthesia Assessment:                           - Prior to the procedure, a History and Physical                            was performed, and patient medications and                            allergies were reviewed. The patient's tolerance of                            previous anesthesia was also reviewed. The risks                            and benefits of the procedure and the sedation                            options and risks were discussed with the patient.                            All questions were answered, and informed consent                            was obtained. Prior Anticoagulants: The patient has                            taken no previous anticoagulant or antiplatelet                            agents except for aspirin. ASA Grade Assessment: II                            - A patient with mild systemic disease. After  reviewing the risks and benefits, the patient was                            deemed in satisfactory condition to undergo the                            procedure.                           After obtaining informed consent, the endoscope was                            passed under direct vision. Throughout the                            procedure, the patient's blood  pressure, pulse, and                            oxygen saturations were monitored continuously. The                            GIF-H190 ([id number]) Olympus gastroscope was                            introduced through the mouth, and advanced to the                            afferent and efferent jejunal loops. The upper GI                            endoscopy was accomplished without difficulty. The                            patient tolerated the procedure well. Scope In: Scope Out: Findings:      The larynx was normal.      The examined esophagus was normal.      Evidence of a gastric bypass was found. A gastric pouch with a small       size was found. The gastrojejunal anastomosis was characterized by       friable mucosa, a hemorrhagic appearance and significant deep distal       ulceration. This was traversed. The pouch-to-jejunum limb was       characterized by healthy appearing mucosa. The duodenum-to-jejunum limb       was examined. Coagulation for hemostasis using argon plasma at 1       liter/minute and 20 watts was successful on a few oozing anastomotic       areas.      The examined jejunum was normal both limbs a moderate distance examined.      The exam was otherwise without abnormality. Impression:               - Normal larynx.                           - Normal esophagus.                           -  Gastric bypass with a small-sized pouch.                            Gastrojejunal anastomosis characterized by friable                            mucosa, a hemorrhagic appearance and large deep                            distal ulceration. A few oozing areas of the                            anastomosis was treated with argon plasma                            coagulation (APC).                           - Normal examined jejunum both limbs seen a good                            distance distally.                           - The examination was otherwise normal.                            - No specimens collected. Recommendation:           - Full liquid diet today. If doing well may slowly                            advance tomorrow and hopefully be able to discharge                            soon                           - No aspirin, ibuprofen, naproxen, or other                            non-steroidal anti-inflammatory drugs long-term.                           - Return to GI clinic PRN.                           - Telephone GI clinic if symptomatic PRN.                           - Repeat upper endoscopy in 2-3 months to evaluate                            the response to therapy. Either by me here in  [place] or by her primary hometown                            gastroenterologist                           - Use sucralfate suspension 1 gram PO QID                            indefinitely. Consider IV iron in the future Procedure Code(s):        --- Professional ---                           [phone], Esophagogastroduodenoscopy, flexible,                            transoral; with control of bleeding, any method Diagnosis Code(s):        --- Professional ---                           G31.51, Bariatric surgery status                           K28.9, Gastrojejunal ulcer, unspecified as acute or                            chronic, without hemorrhage or perforation                           D50.9, Iron deficiency anemia, unspecified                           K92.0, Hematemesis CPT copyright 2019 American Medical Association. All rights reserved. The codes documented in this report are preliminary and upon coder review may  be revised to meet current compliance requirements. Clarene Essex, MD 03/12/2020 3:06:01 PM This report has been signed electronically. Number of Addenda: 0

## 2020-03-12 NOTE — Progress Notes (Signed)
Leah Franco 2:05 PM  Subjective: Patient without any further bleeding in her hospital computer chart reviewed and her case discussed with my partner Dr. Alessandra Bevels and our PA and she has no other complaints and we did discuss her aspirin use  Objective: Vital signs stable afebrile no acute distress exam please see preassessment evaluation labs reviewed recent CT reviewed Assessment: Hematemesis and patient with iron deficiency and gastric bypass  Plan: Okay to proceed with endoscopy and we rediscussed that procedure with anesthesia assistance with further work-up and plans pending those findings  [facility]  office [phone] After 5PM or if no answer call [phone]

## 2020-03-13 ENCOUNTER — Encounter (HOSPITAL_COMMUNITY): Payer: Self-pay | Admitting: Internal Medicine

## 2020-03-13 DIAGNOSIS — K922 Gastrointestinal hemorrhage, unspecified: Secondary | ICD-10-CM

## 2020-03-13 LAB — BASIC METABOLIC PANEL
Anion gap: 9 (ref 5–15)
BUN: 9 mg/dL (ref 6–20)
CO2: 26 mmol/L (ref 22–32)
Calcium: 7.5 mg/dL — ABNORMAL LOW (ref 8.9–10.3)
Chloride: 104 mmol/L (ref 98–111)
Creatinine, Ser: 0.61 mg/dL (ref 0.44–1.00)
GFR, Estimated: >60 mL/min (ref >60)
Glucose, Bld: 124 mg/dL — ABNORMAL HIGH (ref 70–99)
Potassium: 4.6 mmol/L (ref 3.5–5.1)
Sodium: 139 mmol/L (ref 135–145)

## 2020-03-13 LAB — CBC
HCT: 23.9 % — ABNORMAL LOW (ref 36.0–46.0)
HCT: 27.4 % — ABNORMAL LOW (ref 36.0–46.0)
Hemoglobin: 7.1 g/dL — ABNORMAL LOW (ref 12.0–15.0)
Hemoglobin: 8.4 g/dL — ABNORMAL LOW (ref 12.0–15.0)
MCH: 33.8 pg (ref 26.0–34.0)
MCH: 33.6 pg (ref 26.0–34.0)
MCHC: 29.7 g/dL — ABNORMAL LOW (ref 30.0–36.0)
MCHC: 30.7 g/dL (ref 30.0–36.0)
MCV: 113.8 fL — ABNORMAL HIGH (ref 80.0–100.0)
MCV: 109.6 fL — ABNORMAL HIGH (ref 80.0–100.0)
Platelets: 380 10*3/uL (ref 150–400)
Platelets: 382 10*3/uL (ref 150–400)
RBC: 2.1 MIL/uL — ABNORMAL LOW (ref 3.87–5.11)
RBC: 2.5 MIL/uL — ABNORMAL LOW (ref 3.87–5.11)
RDW: 16.7 % — ABNORMAL HIGH (ref 11.5–15.5)
RDW: 16.5 % — ABNORMAL HIGH (ref 11.5–15.5)
WBC: 4.5 10*3/uL (ref 4.0–10.5)
WBC: 4.9 10*3/uL (ref 4.0–10.5)
nRBC: 0.4 % — ABNORMAL HIGH (ref 0.0–0.2)
nRBC: 0.6 % — ABNORMAL HIGH (ref 0.0–0.2)

## 2020-03-13 LAB — PREPARE RBC (CROSSMATCH)

## 2020-03-13 LAB — MAGNESIUM: Magnesium: 2 mg/dL (ref 1.7–2.4)

## 2020-03-13 LAB — PHOSPHORUS: Phosphorus: 1 mg/dL — CL (ref 2.5–4.6)

## 2020-03-13 MED ORDER — DIPHENHYDRAMINE HCL 50 MG/ML IJ SOLN
12.5000 mg | Freq: Four times a day (QID) | INTRAMUSCULAR | Status: DC | PRN
  Administered 2020-03-13: 12.5 mg via INTRAVENOUS
  Filled 2020-03-13: qty 1

## 2020-03-13 MED ORDER — PANTOPRAZOLE SODIUM 40 MG IV SOLR
40.0000 mg | Freq: Two times a day (BID) | INTRAVENOUS | Status: DC
  Administered 2020-03-13 – 2020-03-14 (×2): 40 mg via INTRAVENOUS
  Filled 2020-03-13 (×2): qty 40

## 2020-03-13 MED ORDER — GABAPENTIN 400 MG PO CAPS
800.0000 mg | ORAL_CAPSULE | Freq: Every evening | ORAL | Status: DC
  Administered 2020-03-13: 800 mg via ORAL
  Filled 2020-03-13: qty 2

## 2020-03-13 MED ORDER — FUROSEMIDE 10 MG/ML IJ SOLN
20.0000 mg | Freq: Once | INTRAMUSCULAR | Status: AC
  Administered 2020-03-13: 20 mg via INTRAVENOUS
  Filled 2020-03-13: qty 2

## 2020-03-13 MED ORDER — ACETAMINOPHEN 325 MG PO TABS
650.0000 mg | ORAL_TABLET | Freq: Once | ORAL | Status: AC
  Administered 2020-03-13: 650 mg via ORAL
  Filled 2020-03-13: qty 2

## 2020-03-13 MED ORDER — MUSCLE RUB 10-15 % EX CREA
1.0000 "application " | TOPICAL_CREAM | CUTANEOUS | Status: DC | PRN
  Administered 2020-03-13 – 2020-03-14 (×2): 1 via TOPICAL
  Filled 2020-03-13: qty 85

## 2020-03-13 MED ORDER — POTASSIUM & SODIUM PHOSPHATES 280-160-250 MG PO PACK
1.0000 | PACK | Freq: Three times a day (TID) | ORAL | Status: AC
  Administered 2020-03-13 (×3): 1 via ORAL
  Filled 2020-03-13 (×4): qty 1

## 2020-03-13 MED ORDER — OXYCODONE-ACETAMINOPHEN 5-325 MG PO TABS
1.0000 | ORAL_TABLET | ORAL | Status: DC | PRN
  Administered 2020-03-13 – 2020-03-14 (×7): 1 via ORAL
  Filled 2020-03-13 (×7): qty 1

## 2020-03-13 MED ORDER — METFORMIN HCL 500 MG PO TABS
1000.0000 mg | ORAL_TABLET | Freq: Two times a day (BID) | ORAL | Status: DC
  Administered 2020-03-13 – 2020-03-14 (×2): 1000 mg via ORAL
  Filled 2020-03-13 (×2): qty 2

## 2020-03-13 MED ORDER — SODIUM CHLORIDE 0.9% IV SOLUTION
Freq: Once | INTRAVENOUS | Status: AC
  Administered 2020-03-13: 250 mL via INTRAVENOUS

## 2020-03-13 MED ORDER — GABAPENTIN 300 MG PO CAPS
600.0000 mg | ORAL_CAPSULE | Freq: Three times a day (TID) | ORAL | Status: DC
  Administered 2020-03-13 – 2020-03-14 (×4): 600 mg via ORAL
  Filled 2020-03-13 (×4): qty 2

## 2020-03-13 NOTE — Progress Notes (Signed)
PROGRESS NOTE    Leah Franco  JEH:[id number] DOB: 1966/01/27 DOA: 03/12/2020 PCP: [name], MD  Brief Narrative:  54 year old white female Known history of chronic tachycardia, HTN DM TY 2 multipel back surgeries Bariatric surgery Gastric sleeve 3785 complicated by Y85 def-EGD colonoscopy September 2021 no acute findings Episodic tarry stools  Came to [facility], ER feeling weak having blood in stool-also reported up to 6 episodes hematemesis  Admitted by critical care underwent endoscopy 11/3 and placed on liquid diet-recommend Carafate suspension and?  Regular  Assessment & Plan:   Active Problems:   GIB (gastrointestinal bleeding)   1. 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 Scope 03/12/2020 showed hemorrhagic distal ulceration Rx APC normal judgment b. Diet per gastroenterology c. Blood count dropped today will need to monitor and continue Carafate indefinitely would hold aspirin for now d.  2. Anemia of acute blood loss with hemorrhagic shock currently resolved a. Shock physiology resolved with fluids b. Transfusing 1 unit PRBC c. She had a mild transfusion reaction earlier but was given Benadryl and has not recurred d. If hemoglobin remains above 8 likely can discharge home if no further stigmata of bleeding 3. Chronic tachycardia HTN a. Can resume metoprolol low-dose on discharge home dose is 25 twice daily 4. DM TY 2 with neuropathy/nephropathy a. Continued Metformin 1000 twice daily and Neurontin specialized dosing 600 3 times daily and 800 at bedtime b. Follow-up as an outpatient for A1c and further management by PCP 5. Psoriatic arthritis a. Holding at this time Enbrel 6. Prior back surgeries 7. BMI 33  DVT prophylaxis: SCD Code Status: Full Family Communication: None at bedside Disposition:   Status is: Inpatient  Remains inpatient appropriate because:Persistent severe electrolyte disturbances, Ongoing active pain requiring  inpatient pain management, Ongoing diagnostic testing needed not appropriate for outpatient work up and Unsafe d/c plan   Dispo: The patient is from: Home              Anticipated d/c is to: Home              Anticipated d/c date is: 1 day              Patient currently is not medically stable to d/c.       Consultants:   Gastroenterology  Procedures: Endoscopy  Antimicrobials: None   Subjective: Doing fair no new issues Tolerating diet no further bright red blood however dark stool this morning No abdominal pain asking for some of her home meds  Objective: Vitals:   03/12/20 2059 03/13/20 0245 03/13/20 0345 03/13/20 0555  BP: 104/74 (!) 97/58 99/66 90/64   Pulse: (!) 105 (!) 102 98 96  Resp: 17 17 16 17   Temp: 98.3 F (36.8 C) 98.4 F (36.9 C) 97.6 F (36.4 C) 98.2 F (36.8 C)  TempSrc: Oral Oral Oral Oral  SpO2: 100% 100% 100% 100%  Weight:      Height:        Intake/Output Summary (Last 24 hours) at 03/13/2020 0736 Last data filed at 03/13/2020 0559 Gross per 24 hour  Intake 470 ml  Output --  Net 470 ml   Filed Weights   03/12/20 0336 03/12/20 0659 03/12/20 1333  Weight: 85.7 kg 84.4 kg 85.7 kg    Examination:  General exam: EOMI NCAT obese pleasant Mallampati 4 Respiratory system: Clear no rales no rhonchi Cardiovascular system: S1-S2 no murmur no rub no gallop Gastrointestinal system: Soft nontender no rebound no guarding.  Central nervous system: Neurologically intact moving 4 limbs equally Extremities: ROM intact Skin: No lower extremity edema Psychiatry: Pleasant affect  Data Reviewed: I have personally reviewed following labs and imaging studies  Hemoglobin down from 7.2-7.1 White coat 4.5 BUN/creatinine 8/0.6 Phosphorus 1.0   Radiology Studies: DG Chest Portable 1 View  Result Date: 03/12/2020 CLINICAL DATA:  Emesis.  Bloody stools. EXAM: PORTABLE CHEST 1 VIEW COMPARISON:  04/24/2009 FINDINGS: Mild cardiac enlargement. No pleural  effusion or edema. Calcified granuloma identified in the left upper lobe. No airspace densities. Remote right posterolateral rib deformity. Scoliosis with posterior rod fixation of the thoracolumbar spine. IMPRESSION: No acute cardiopulmonary abnormalities. Electronically Signed   By: Kerby Moors M.D.   On: 03/12/2020 05:18     Scheduled Meds: . Chlorhexidine Gluconate Cloth  6 each Topical Daily  . [START ON 03/15/2020] pantoprazole  40 mg Intravenous Q12H  . potassium & sodium phosphates  1 packet Oral Q8H  . sucralfate  1 g Oral TID WC & HS   Continuous Infusions: . pantoprozole (PROTONIX) infusion 8 mg/hr (03/13/20 0251)     LOS: 1 day    Time spent: 25  Nita Sells, MD [facility] To contact the attending provider between 7A-7P or the covering provider during after hours 7P-7A, please log into the web site [email or web address] and access using universal [place] password for that web site. If you do not have the password, please call the hospital operator.  03/13/2020, 7:36 AM

## 2020-03-13 NOTE — Progress Notes (Signed)
[facility] Progress Note  Leah Franco 54 y.o. October 18, 1965  CC:  Upper GI bleeding  Subjective: Patient denies any complaints today.  She has not had a bowel movement today.  Last bowel movement yesterday, which was melenic.  Denies any abdominal pain, nausea, vomiting.  Denies any chest pain, dizziness, or shortness of breath.  Patient's hemoglobin decreased to 7.1 this morning as compared to 8.7 yesterday, possibly from equilibration, as patient does not seem to have any signs of active GI bleeding at this time.  ROS : Review of Systems  Respiratory: Negative for cough and shortness of breath.   Cardiovascular: Negative for chest pain and palpitations.  Gastrointestinal: Negative for abdominal pain, blood in stool, constipation, diarrhea, heartburn, melena, nausea and vomiting.   Objective: Vital signs in last 24 hours: Vitals:   03/13/20 0925 03/13/20 1015  BP: 107/80 102/79  Pulse: (!) 118 (!) 103  Resp: 20 20  Temp: 98 F (36.7 C)   SpO2: 100% 100%    Physical Exam:  General:  Alert, oriented, cooperative, no distress  Head:  Normocephalic, without obvious abnormality, atraumatic  Eyes:  Mild conjunctival pallor, EOMs intact  Lungs:   Clear to auscultation bilaterally, respirations unlabored  Heart:  Mildly tachycardic with regular rhythm, S1, S2 normal  Abdomen:   Soft, non-tender, non-distended, bowel sounds active all four quadrants,  no guarding or peritoneal signs  Extremities: Extremities normal, atraumatic, no  edema  Pulses: 2+ and symmetric    Lab Results: Recent Labs    03/12/20 0411 03/13/20 0546  NA 134* 139  K 5.3* 4.6  CL 99 104  CO2 24 26  GLUCOSE 229* 124*  BUN 36* 9  CREATININE 0.64 0.61  CALCIUM 7.8* 7.5*  MG  --  2.0  PHOS  --  1.0*   Recent Labs    03/12/20 0411  AST 20  ALT 24  ALKPHOS 42  BILITOT 0.4  PROT 6.0*  ALBUMIN 3.1*   Recent Labs    03/12/20 0817 03/13/20 0546  WBC 7.5 4.5  HGB 7.2* 7.1*  HCT 23.9*  23.9*  MCV 114.4* 113.8*  PLT 369 380   Recent Labs    03/12/20 0411  LABPROT 13.0  INR 1.0    Assessment: Upper GI bleeding -EGD yesterday revealed gastrojejunal anastomosis characterized by friable mucosa, a hemorrhagic appearance and large deep distal ulceration. A few oozing areas of the anastomosis was treated with APC -Patient's hemoglobin decreased to 7.1 this morning as compared to 8.7 yesterday, possibly from equilibration, as patient does not seem to have any signs of active GI bleeding at this time.  Patient receiving 1u pRBCs -BUN now within normal limits (9) with Cr 0.61, as compared to BUN 36 yesterday.  Normalization of BUN suggests resolution of upper GI bleeding  Hypophosphatemia  History of gastric bypass (~2002)  DM type II  Plan: Continue Protonix 40 mg IV BID.  Continue Carafate 1g PO four times daily indefinitely.  OK to advance to soft diet given no further signs of bleeding.  Continue to monitor H&H with transfusion as needed to maintain Hgb >7.    [facility] will follow.  Salley Slaughter PA-C 03/13/2020, 11:49 AM  Contact #  [phone]

## 2020-03-13 NOTE — Progress Notes (Addendum)
eLink Physician-Brief Progress Note Patient Name: Leah Franco DOB: [birth year] MRN: [id number]   Date of Service  03/13/2020  HPI/Events of Note  Patient c/o back pain and requests Percocet. AST and ALT both normal.   eICU Interventions  Plan: 1. Percocet 5 mg / 325 mg PO Q 4 hours PRN pain.      Intervention Category Major Interventions: Other:  Lysle Dingwall 03/13/2020, 1:55 AM

## 2020-03-13 NOTE — Progress Notes (Signed)
eLink Physician-Brief Progress Note Patient Name: Leah Franco DOB: 1966-02-22 MRN: [id number]   Date of Service  03/13/2020  HPI/Events of Note  Hypophosphatemia - PO4--- = 1.0 and K+ = 4.6. NaPO4--- IV on backorder.   eICU Interventions  Will replace PO4--- orally.      Intervention Category Major Interventions: Electrolyte abnormality - evaluation and management  Aalyssa Elderkin Eugene 03/13/2020, 7:03 AM

## 2020-03-13 NOTE — Progress Notes (Signed)
   03/13/20 0345  Assess: MEWS Score  Temp 97.6 F (36.4 C)  BP 99/66  Pulse Rate 98  Resp 16  SpO2 100 %  O2 Device Room Air  Assess: MEWS Score  MEWS Temp 0  MEWS Systolic 1  MEWS Pulse 0  MEWS RR 0  MEWS LOC 0  MEWS Score 1  MEWS Score Color Green  Assess: if the MEWS score is Yellow or Red  Were vital signs taken at a resting state? Yes  Focused Assessment No change from prior assessment  Early Detection of Sepsis Score *See Row Information* Low  MEWS guidelines implemented *See Row Information* No, vital signs rechecked  Document  Patient Outcome Stabilized after interventions  Progress note created (see row info) Yes  pt registered as a yellow mews d/t slight tachycardia and soft BP.  Upon re-assessment an hour later, pt's VSS absent of tachycardia and BP slightly improved WNL.  Pt is resting w/ no s/s of acute distress will con't to monitor

## 2020-03-14 ENCOUNTER — Encounter (HOSPITAL_COMMUNITY): Payer: Self-pay | Admitting: Gastroenterology

## 2020-03-14 LAB — TYPE AND SCREEN
ABO/RH(D): O POS
Antibody Screen: NEGATIVE
Unit division: 0

## 2020-03-14 LAB — CBC
HCT: 32.6 % — ABNORMAL LOW (ref 36.0–46.0)
Hemoglobin: 9.9 g/dL — ABNORMAL LOW (ref 12.0–15.0)
MCH: 33.7 pg (ref 26.0–34.0)
MCHC: 30.4 g/dL (ref 30.0–36.0)
MCV: 110.9 fL — ABNORMAL HIGH (ref 80.0–100.0)
Platelets: 489 10*3/uL — ABNORMAL HIGH (ref 150–400)
RBC: 2.94 MIL/uL — ABNORMAL LOW (ref 3.87–5.11)
RDW: 16.8 % — ABNORMAL HIGH (ref 11.5–15.5)
WBC: 6.1 10*3/uL (ref 4.0–10.5)
nRBC: 0.3 % — ABNORMAL HIGH (ref 0.0–0.2)

## 2020-03-14 LAB — BPAM RBC
Blood Product Expiration Date: 202112052359
ISSUE DATE / TIME: 202111040838
Unit Type and Rh: 5100

## 2020-03-14 MED ORDER — PANTOPRAZOLE SODIUM 40 MG PO TBEC: 40.0000 mg | DELAYED_RELEASE_TABLET | Freq: Two times a day (BID) | ORAL | 0 refills | Status: AC

## 2020-03-14 MED ORDER — SUCRALFATE 1 G PO TABS: 1.0000 g | ORAL_TABLET | Freq: Four times a day (QID) | ORAL | 0 refills | Status: AC

## 2020-03-14 NOTE — Progress Notes (Signed)
DC instructions reviewed with patient. Pt verbalized that she wishes to pick Rx up at [facility] at [address] and Rx was called into [facility], primary RN notified of patient request and asked her if she would follow up with patient before DC. 20g IV to LFA dc'd, tip intact, site WNL, gauze applied, no bleeding noted.

## 2020-03-14 NOTE — Progress Notes (Signed)
Leah Franco 9:36 AM  Subjective: Patient doing well tolerating her diet and we answered all of her questions and her stools are still black but she feels much better  Objective: Vital signs stable afebrile no acute distress abdomen is soft nontender she does have a little rib nodule that we discussed  Assessment: Anastomotic ulcer  Plan: Okay with me to go home today and happy to see back as needed otherwise she will check her insurance and see if she has to go back to her primary gastroenterologist and will hold aspirin long-term and continue pump inhibitor and Carafate and repeat CBC and iron studies in a few weeks by primary care or hematology team to decide if she needs IV iron or not  [facility]  office [phone] After 5PM or if no answer call [phone]

## 2020-03-14 NOTE — Discharge Summary (Signed)
Physician Discharge Summary  Leah Franco NKN:[id number] DOB: 1965/06/24 DOA: 03/12/2020  PCP: [name], MD  Admit date: 03/12/2020 Discharge date: 03/14/2020  Time spent: 15 minutes  Recommendations for Outpatient Follow-up:  1. Requires CBC Chem-12 in 1 week 2. Outpatient consideration for iron studies and eventual iron infusions if needed 3. Follow-up [facility] for heme or GI needs or can follow-up with Dr. Watt Climes  Discharge Diagnoses:  Active Problems:   GIB (gastrointestinal bleeding)   Discharge Condition: Improved  Diet recommendation: Soft  Filed Weights   03/12/20 0336 03/12/20 0659 03/12/20 1333  Weight: 85.7 kg 84.4 kg 85.7 kg    History of present illness:  54 year old white female Known history of chronic tachycardia, HTN DM TY 2 multipel back surgeries Bariatric surgery Gastric sleeve 3790 complicated by W40 def-EGD colonoscopy September 2021 no acute findings Episodic tarry stools  Came to [facility], ER feeling weak having blood in stool-also reported up to 6 episodes hematemesis  Admitted by critical care underwent endoscopy 11/3 and placed on liquid diet-recommend Carafate suspension and?  Regular  Assessment & Plan:   Active Problems:   GIB (gastrointestinal bleeding)   1. 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 Scope 03/12/2020 showed hemorrhagic distal ulceration Rx APC  b. Diet advanced to soft and can go home on regular diet c. Blood count dropped today will need to monitor and continue Carafate indefinitely would hold aspirin for now 2. And decision to admit as an outpatient with regards to continuation although would probably hold it going forwardAnemia of acute blood loss with hemorrhagic shock currently resolved a. Shock physiology resolved with fluids b. Transfusing 1 unit PRBC on 03/13/2020 c. She had a mild transfusion reaction earlier but was given Benadryl and has not recurred d. Hemoglobin stabilized at  9.9 3. Chronic tachycardia HTN a. Can resume metoprolol low-dose on discharge home dose is 25 twice daily 4. DM TY 2 with neuropathy/nephropathy a. Continued Metformin 1000 twice daily and Neurontin specialized dosing 600 3 times daily and 800 at bedtime b. Follow-up as an outpatient for A1c and further management by PCP c. Outpatient A1c 5. Psoriatic arthritis a. Holding at this time Enbrel but resumed on discharge 6. Prior back surgeries 7. BMI 33   Discharge Exam: Vitals:   03/14/20 0539 03/14/20 0720  BP: 101/71   Pulse: 99   Resp: 16 16  Temp: 97.8 F (36.6 C)   SpO2: 100% 100%    General: Awake coherent no distress EOMI NCAT no focal deficit Cardiovascular: S1-S2 no murmur no rub no gallop sitting up in bed tearful Respiratory: Clear no added sound Abdomen soft no rebound no guarding Right arm has some swelling with superficial phlebitis Neurologically intact moving all 4 limbs  Discharge Instructions   Discharge Instructions    Diet - low sodium heart healthy   Complete by: As directed    Discharge instructions   Complete by: As directed    No aspirin for the time being-we will prescribe proton pump inhibitor Protonix to be taken twice a day at least for several months and Carafate which is a tablet to coat your belly to also help you with concerns for bleeding You will need labs in about 1 to 2 weeks to ensure that your hemoglobin is stable You will also need follow-up with your regular doctor to check your iron studies and ensure that you are not more anemic he may need IV iron or transfusions in the  outpatient setting You can resume the majority of your other meds in the outpatient setting   Increase activity slowly   Complete by: As directed      Allergies as of 03/14/2020      Reactions   Lactose    Other reaction(s): GI Upset (intolerance), Other (See Comments)   Tape       Medication List    STOP taking these medications   aspirin EC 81 MG  tablet   diphenhydrAMINE 25 MG tablet Commonly known as: BENADRYL     TAKE these medications   acetaminophen 500 MG tablet Commonly known as: TYLENOL Take 1,000 mg by mouth every 6 (six) hours as needed for mild pain, fever or headache.   atorvastatin 10 MG tablet Commonly known as: LIPITOR Take 10 mg by mouth daily.   denosumab 60 MG/ML Sosy injection Commonly known as: PROLIA Inject 60 mg into the skin every 6 (six) months.   Enbrel SureClick 50 MG/ML injection Generic drug: etanercept Inject 1 mL into the skin once a week.   ergocalciferol 1.25 MG (50000 UT) capsule Commonly known as: VITAMIN D2 Take 50,000 Units by mouth 2 (two) times a week.   gabapentin 300 MG capsule Commonly known as: NEURONTIN Take 600 mg by mouth 3 (three) times daily.   gabapentin 800 MG tablet Commonly known as: NEURONTIN Take 1 tablet by mouth every evening.   metFORMIN 500 MG tablet Commonly known as: GLUCOPHAGE Take 1,000 mg by mouth 2 (two) times daily.   metoprolol tartrate 25 MG tablet Commonly known as: LOPRESSOR Take 25 mg by mouth 2 (two) times daily.   Ozempic (0.25 or 0.5 MG/DOSE) 2 MG/1.5ML Sopn Generic drug: Semaglutide(0.25 or 0.5MG /DOS) Inject 0.5 mg into the skin once a week.   pantoprazole 40 MG tablet Commonly known as: Protonix Take 1 tablet (40 mg total) by mouth 2 (two) times daily.   sucralfate 1 g tablet Commonly known as: Carafate Take 1 tablet (1 g total) by mouth 4 (four) times daily.      Allergies  Allergen Reactions  . Lactose     Other reaction(s): GI Upset (intolerance), Other (See Comments)  . Tape       The results of significant diagnostics from this hospitalization (including imaging, microbiology, ancillary and laboratory) are listed below for reference.    Significant Diagnostic Studies: DG Chest Portable 1 View  Result Date: 03/12/2020 CLINICAL DATA:  Emesis.  Bloody stools. EXAM: PORTABLE CHEST 1 VIEW COMPARISON:  04/24/2009  FINDINGS: Mild cardiac enlargement. No pleural effusion or edema. Calcified granuloma identified in the left upper lobe. No airspace densities. Remote right posterolateral rib deformity. Scoliosis with posterior rod fixation of the thoracolumbar spine. IMPRESSION: No acute cardiopulmonary abnormalities. Electronically Signed   By: Kerby Moors M.D.   On: 03/12/2020 05:18    Microbiology: Recent Results (from the past 240 hour(s))  Respiratory Panel by RT PCR (Flu A&B, Covid) - Nasopharyngeal Swab     Status: None   Collection Time: 03/12/20  4:30 AM   Specimen: Nasopharyngeal Swab  Result Value Ref Range Status   SARS Coronavirus 2 by RT PCR NEGATIVE NEGATIVE Final    Comment: (NOTE) SARS-CoV-2 target nucleic acids are NOT DETECTED.  The SARS-CoV-2 RNA is generally detectable in upper respiratoy specimens during the acute phase of infection. The lowest concentration of SARS-CoV-2 viral copies this assay can detect is 131 copies/mL. A negative result does not preclude SARS-Cov-2 infection and should not be used as the  sole basis for treatment or other patient management decisions. A negative result may occur with  improper specimen collection/handling, submission of specimen other than nasopharyngeal swab, presence of viral mutation(s) within the areas targeted by this assay, and inadequate number of viral copies (<131 copies/mL). A negative result must be combined with clinical observations, patient history, and epidemiological information. The expected result is Negative.  Fact Sheet for Patients:  PinkCheek.be  Fact Sheet for Healthcare Providers:  GravelBags.it  This test is no t yet approved or cleared by the [place] FDA and  has been authorized for detection and/or diagnosis of SARS-CoV-2 by FDA under an Emergency Use Authorization (EUA). This EUA will remain  in effect (meaning this test can be used) for the  duration of the COVID-19 declaration under Section 564(b)(1) of the Act, 21 U.S.C. section 360bbb-3(b)(1), unless the authorization is terminated or revoked sooner.     Influenza A by PCR NEGATIVE NEGATIVE Final   Influenza B by PCR NEGATIVE NEGATIVE Final    Comment: (NOTE) The Xpert Xpress SARS-CoV-2/FLU/RSV assay is intended as an aid in  the diagnosis of influenza from Nasopharyngeal swab specimens and  should not be used as a sole basis for treatment. Nasal washings and  aspirates are unacceptable for Xpert Xpress SARS-CoV-2/FLU/RSV  testing.  Fact Sheet for Patients: PinkCheek.be  Fact Sheet for Healthcare Providers: GravelBags.it  This test is not yet approved or cleared by the [place] FDA and  has been authorized for detection and/or diagnosis of SARS-CoV-2 by  FDA under an Emergency Use Authorization (EUA). This EUA will remain  in effect (meaning this test can be used) for the duration of the  Covid-19 declaration under Section 564(b)(1) of the Act, 21  U.S.C. section 360bbb-3(b)(1), unless the authorization is  terminated or revoked. Performed at [facility], [place] [place], [place] [address]   MRSA PCR Screening     Status: None   Collection Time: 03/12/20  6:53 AM   Specimen: Nasal Mucosa; Nasopharyngeal  Result Value Ref Range Status   MRSA by PCR NEGATIVE NEGATIVE Final    Comment:        The GeneXpert MRSA Assay (FDA approved for NASAL specimens only), is one component of a comprehensive MRSA colonization surveillance program. It is not intended to diagnose MRSA infection nor to guide or monitor treatment for MRSA infections. Performed at [facility], [place] [address], [place], [place] [address]      Labs: Basic Metabolic Panel: Recent Labs  Lab 03/12/20 0411 03/13/20 0546  NA 134* 139  K 5.3* 4.6  CL 99 104  CO2 24 26  GLUCOSE 229* 124*  BUN 36* 9   CREATININE 0.64 0.61  CALCIUM 7.8* 7.5*  MG  --  2.0  PHOS  --  1.0*   Liver Function Tests: Recent Labs  Lab 03/12/20 0411  AST 20  ALT 24  ALKPHOS 42  BILITOT 0.4  PROT 6.0*  ALBUMIN 3.1*   No results for input(s): LIPASE, AMYLASE in the last 168 hours. No results for input(s): AMMONIA in the last 168 hours. CBC: Recent Labs  Lab 03/12/20 0411 03/12/20 0817 03/13/20 0546 03/13/20 1548 03/14/20 0418  WBC 9.6 7.5 4.5 4.9 6.1  HGB 8.7* 7.2* 7.1* 8.4* 9.9*  HCT 30.1* 23.9* 23.9* 27.4* 32.6*  MCV 113.6* 114.4* 113.8* 109.6* 110.9*  PLT 505* 369 380 382 489*   Cardiac Enzymes: No results for input(s): CKTOTAL, CKMB, CKMBINDEX, TROPONINI in  the last 168 hours. BNP: BNP (last 3 results) No results for input(s): BNP in the last 8760 hours.  ProBNP (last 3 results) No results for input(s): PROBNP in the last 8760 hours.  CBG: Recent Labs  Lab 03/12/20 0657  GLUCAP 151*       Signed:  Nita Sells MD   [facility] 03/14/2020, 10:33 AM

## 2020-04-14 ENCOUNTER — Emergency Department (HOSPITAL_COMMUNITY): Payer: PRIVATE HEALTH INSURANCE

## 2020-04-14 ENCOUNTER — Encounter (HOSPITAL_COMMUNITY): Payer: Self-pay | Admitting: *Deleted

## 2020-04-14 ENCOUNTER — Other Ambulatory Visit: Payer: Self-pay

## 2020-04-14 ENCOUNTER — Inpatient Hospital Stay (HOSPITAL_COMMUNITY)
Admission: EM | Admit: 2020-04-14 | Discharge: 2020-05-05 | DRG: 552 | Disposition: A | Discharge status: Rehabilitation Facility | Payer: PRIVATE HEALTH INSURANCE | Attending: Neurosurgery | Admitting: Neurosurgery

## 2020-04-14 DIAGNOSIS — S12030A Displaced posterior arch fracture of first cervical vertebra, initial encounter for closed fracture: Secondary | ICD-10-CM | POA: Diagnosis present

## 2020-04-14 DIAGNOSIS — Z419 Encounter for procedure for purposes other than remedying health state, unspecified: Secondary | ICD-10-CM

## 2020-04-14 DIAGNOSIS — S12120A Other displaced dens fracture, initial encounter for closed fracture: Secondary | ICD-10-CM

## 2020-04-14 DIAGNOSIS — E8809 Other disorders of plasma-protein metabolism, not elsewhere classified: Secondary | ICD-10-CM | POA: Diagnosis present

## 2020-04-14 DIAGNOSIS — R296 Repeated falls: Secondary | ICD-10-CM | POA: Diagnosis present

## 2020-04-14 DIAGNOSIS — S129XXA Fracture of neck, unspecified, initial encounter: Secondary | ICD-10-CM | POA: Diagnosis present

## 2020-04-14 DIAGNOSIS — W19XXXA Unspecified fall, initial encounter: Secondary | ICD-10-CM

## 2020-04-14 DIAGNOSIS — E1151 Type 2 diabetes mellitus with diabetic peripheral angiopathy without gangrene: Secondary | ICD-10-CM | POA: Diagnosis present

## 2020-04-14 DIAGNOSIS — Z7984 Long term (current) use of oral hypoglycemic drugs: Secondary | ICD-10-CM

## 2020-04-14 DIAGNOSIS — D539 Nutritional anemia, unspecified: Secondary | ICD-10-CM | POA: Diagnosis present

## 2020-04-14 DIAGNOSIS — M21372 Foot drop, left foot: Secondary | ICD-10-CM | POA: Diagnosis present

## 2020-04-14 DIAGNOSIS — Y92008 Other place in unspecified non-institutional (private) residence as the place of occurrence of the external cause: Secondary | ICD-10-CM

## 2020-04-14 DIAGNOSIS — S0003XA Contusion of scalp, initial encounter: Secondary | ICD-10-CM | POA: Diagnosis present

## 2020-04-14 DIAGNOSIS — G629 Polyneuropathy, unspecified: Secondary | ICD-10-CM

## 2020-04-14 DIAGNOSIS — I2510 Atherosclerotic heart disease of native coronary artery without angina pectoris: Secondary | ICD-10-CM | POA: Diagnosis present

## 2020-04-14 DIAGNOSIS — Z9884 Bariatric surgery status: Secondary | ICD-10-CM

## 2020-04-14 DIAGNOSIS — S0181XA Laceration without foreign body of other part of head, initial encounter: Secondary | ICD-10-CM | POA: Diagnosis present

## 2020-04-14 DIAGNOSIS — I10 Essential (primary) hypertension: Secondary | ICD-10-CM | POA: Diagnosis present

## 2020-04-14 DIAGNOSIS — K219 Gastro-esophageal reflux disease without esophagitis: Secondary | ICD-10-CM | POA: Diagnosis present

## 2020-04-14 DIAGNOSIS — R5381 Other malaise: Secondary | ICD-10-CM | POA: Diagnosis present

## 2020-04-14 DIAGNOSIS — R140 Abdominal distension (gaseous): Secondary | ICD-10-CM

## 2020-04-14 DIAGNOSIS — S12110A Anterior displaced Type II dens fracture, initial encounter for closed fracture: Secondary | ICD-10-CM | POA: Diagnosis not present

## 2020-04-14 DIAGNOSIS — W108XXA Fall (on) (from) other stairs and steps, initial encounter: Secondary | ICD-10-CM | POA: Diagnosis present

## 2020-04-14 DIAGNOSIS — E1142 Type 2 diabetes mellitus with diabetic polyneuropathy: Secondary | ICD-10-CM | POA: Diagnosis present

## 2020-04-14 DIAGNOSIS — Z20822 Contact with and (suspected) exposure to covid-19: Secondary | ICD-10-CM | POA: Diagnosis present

## 2020-04-14 DIAGNOSIS — W010XXA Fall on same level from slipping, tripping and stumbling without subsequent striking against object, initial encounter: Secondary | ICD-10-CM

## 2020-04-14 DIAGNOSIS — I739 Peripheral vascular disease, unspecified: Secondary | ICD-10-CM | POA: Diagnosis present

## 2020-04-14 DIAGNOSIS — S12400A Unspecified displaced fracture of fifth cervical vertebra, initial encounter for closed fracture: Secondary | ICD-10-CM

## 2020-04-14 DIAGNOSIS — S0101XA Laceration without foreign body of scalp, initial encounter: Secondary | ICD-10-CM | POA: Diagnosis present

## 2020-04-14 DIAGNOSIS — S12100A Unspecified displaced fracture of second cervical vertebra, initial encounter for closed fracture: Secondary | ICD-10-CM

## 2020-04-14 DIAGNOSIS — Z79899 Other long term (current) drug therapy: Secondary | ICD-10-CM

## 2020-04-14 DIAGNOSIS — Z8673 Personal history of transient ischemic attack (TIA), and cerebral infarction without residual deficits: Secondary | ICD-10-CM

## 2020-04-14 DIAGNOSIS — T148XXA Other injury of unspecified body region, initial encounter: Secondary | ICD-10-CM

## 2020-04-14 DIAGNOSIS — S12001A Unspecified nondisplaced fracture of first cervical vertebra, initial encounter for closed fracture: Secondary | ICD-10-CM

## 2020-04-14 DIAGNOSIS — M542 Cervicalgia: Secondary | ICD-10-CM | POA: Diagnosis not present

## 2020-04-14 DIAGNOSIS — E785 Hyperlipidemia, unspecified: Secondary | ICD-10-CM | POA: Diagnosis present

## 2020-04-14 DIAGNOSIS — Z96649 Presence of unspecified artificial hip joint: Secondary | ICD-10-CM | POA: Diagnosis present

## 2020-04-14 DIAGNOSIS — E872 Acidosis: Secondary | ICD-10-CM | POA: Diagnosis present

## 2020-04-14 DIAGNOSIS — Z23 Encounter for immunization: Secondary | ICD-10-CM

## 2020-04-14 DIAGNOSIS — E669 Obesity, unspecified: Secondary | ICD-10-CM | POA: Diagnosis present

## 2020-04-14 DIAGNOSIS — I890 Lymphedema, not elsewhere classified: Secondary | ICD-10-CM | POA: Diagnosis present

## 2020-04-14 DIAGNOSIS — E119 Type 2 diabetes mellitus without complications: Secondary | ICD-10-CM

## 2020-04-14 LAB — RESP PANEL BY RT-PCR (FLU A&B, COVID) ARPGX2
Influenza A by PCR: NEGATIVE
Influenza B by PCR: NEGATIVE
SARS Coronavirus 2 by RT PCR: NEGATIVE

## 2020-04-14 LAB — CBC
HCT: 37.1 % (ref 36.0–46.0)
Hemoglobin: 11.2 g/dL — ABNORMAL LOW (ref 12.0–15.0)
MCH: 31.2 pg (ref 26.0–34.0)
MCHC: 30.2 g/dL (ref 30.0–36.0)
MCV: 103.3 fL — ABNORMAL HIGH (ref 80.0–100.0)
Platelets: 432 10*3/uL — ABNORMAL HIGH (ref 150–400)
RBC: 3.59 MIL/uL — ABNORMAL LOW (ref 3.87–5.11)
RDW: 14.5 % (ref 11.5–15.5)
WBC: 9.7 10*3/uL (ref 4.0–10.5)
nRBC: 0 % (ref 0.0–0.2)

## 2020-04-14 LAB — BASIC METABOLIC PANEL
Anion gap: 12 (ref 5–15)
BUN: 15 mg/dL (ref 6–20)
CO2: 25 mmol/L (ref 22–32)
Calcium: 8.1 mg/dL — ABNORMAL LOW (ref 8.9–10.3)
Chloride: 102 mmol/L (ref 98–111)
Creatinine, Ser: 0.51 mg/dL (ref 0.44–1.00)
GFR, Estimated: >60 mL/min (ref >60)
Glucose, Bld: 115 mg/dL — ABNORMAL HIGH (ref 70–99)
Potassium: 3.7 mmol/L (ref 3.5–5.1)
Sodium: 139 mmol/L (ref 135–145)

## 2020-04-14 LAB — CBG MONITORING, ED: Glucose-Capillary: 128 mg/dL — ABNORMAL HIGH (ref 70–99)

## 2020-04-14 MED ORDER — LIDOCAINE-EPINEPHRINE 1 %-1:100000 IJ SOLN
20.0000 mL | Freq: Once | INTRAMUSCULAR | Status: AC
  Administered 2020-04-14: 20 mL
  Filled 2020-04-14: qty 1

## 2020-04-14 MED ORDER — HYDROCODONE-ACETAMINOPHEN 5-325 MG PO TABS
1.0000 | ORAL_TABLET | ORAL | Status: DC | PRN
  Administered 2020-04-15 – 2020-04-17 (×8): 2 via ORAL
  Administered 2020-04-17: 1 via ORAL
  Administered 2020-04-17 – 2020-04-18 (×3): 2 via ORAL
  Administered 2020-04-18: 1 via ORAL
  Administered 2020-04-18 – 2020-04-19 (×4): 2 via ORAL
  Filled 2020-04-14 (×20): qty 2

## 2020-04-14 MED ORDER — DULOXETINE HCL 60 MG PO CPEP
60.0000 mg | ORAL_CAPSULE | Freq: Every evening | ORAL | Status: DC
  Administered 2020-04-14 – 2020-05-04 (×20): 60 mg via ORAL
  Filled 2020-04-14 (×21): qty 1

## 2020-04-14 MED ORDER — METFORMIN HCL 500 MG PO TABS
1000.0000 mg | ORAL_TABLET | Freq: Every day | ORAL | Status: DC
  Administered 2020-04-14 – 2020-04-29 (×16): 1000 mg via ORAL
  Filled 2020-04-14 (×16): qty 2

## 2020-04-14 MED ORDER — ONDANSETRON HCL 4 MG PO TABS
4.0000 mg | ORAL_TABLET | Freq: Four times a day (QID) | ORAL | Status: DC | PRN
  Filled 2020-04-14: qty 1

## 2020-04-14 MED ORDER — CYCLOBENZAPRINE HCL 10 MG PO TABS
5.0000 mg | ORAL_TABLET | Freq: Three times a day (TID) | ORAL | Status: DC | PRN
  Administered 2020-04-15 – 2020-04-25 (×20): 5 mg via ORAL
  Filled 2020-04-14 (×22): qty 1

## 2020-04-14 MED ORDER — HYDROMORPHONE HCL 1 MG/ML IJ SOLN
0.5000 mg | INTRAMUSCULAR | Status: DC | PRN
  Administered 2020-04-14 – 2020-04-27 (×33): 1 mg via INTRAVENOUS
  Filled 2020-04-14 (×35): qty 1

## 2020-04-14 MED ORDER — SEMAGLUTIDE(0.25 OR 0.5MG/DOS) 2 MG/1.5ML ~~LOC~~ SOPN
0.5000 mg | PEN_INJECTOR | SUBCUTANEOUS | Status: DC
  Filled 2020-04-14 (×2): qty 0.38

## 2020-04-14 MED ORDER — GABAPENTIN 400 MG PO CAPS
800.0000 mg | ORAL_CAPSULE | Freq: Every day | ORAL | Status: DC
  Administered 2020-04-14 – 2020-05-04 (×21): 800 mg via ORAL
  Filled 2020-04-14 (×12): qty 2
  Filled 2020-04-14: qty 8
  Filled 2020-04-14 (×8): qty 2

## 2020-04-14 MED ORDER — METOPROLOL TARTRATE 25 MG PO TABS
25.0000 mg | ORAL_TABLET | Freq: Two times a day (BID) | ORAL | Status: DC
  Administered 2020-04-14 – 2020-05-05 (×34): 25 mg via ORAL
  Filled 2020-04-14 (×41): qty 1

## 2020-04-14 MED ORDER — DIPHENHYDRAMINE HCL 25 MG PO CAPS
50.0000 mg | ORAL_CAPSULE | Freq: Every day | ORAL | Status: DC
  Administered 2020-04-14 – 2020-05-04 (×21): 50 mg via ORAL
  Filled 2020-04-14 (×21): qty 2

## 2020-04-14 MED ORDER — METFORMIN HCL 500 MG PO TABS
500.0000 mg | ORAL_TABLET | Freq: Every day | ORAL | Status: DC
  Administered 2020-04-16 – 2020-04-29 (×13): 500 mg via ORAL
  Filled 2020-04-14 (×15): qty 1

## 2020-04-14 MED ORDER — ACETAMINOPHEN 650 MG RE SUPP: 650.0000 mg | Freq: Four times a day (QID) | RECTAL | Status: DC | PRN

## 2020-04-14 MED ORDER — MORPHINE SULFATE (PF) 4 MG/ML IV SOLN
4.0000 mg | Freq: Once | INTRAVENOUS | Status: AC
  Administered 2020-04-14: 4 mg via INTRAVENOUS
  Filled 2020-04-14: qty 1

## 2020-04-14 MED ORDER — GABAPENTIN 300 MG PO CAPS
600.0000 mg | ORAL_CAPSULE | Freq: Three times a day (TID) | ORAL | Status: DC
  Administered 2020-04-15 – 2020-05-05 (×59): 600 mg via ORAL
  Filled 2020-04-14 (×62): qty 2

## 2020-04-14 MED ORDER — LORAZEPAM 2 MG/ML IJ SOLN: 1.0000 mg | Freq: Once | INTRAMUSCULAR | Status: DC

## 2020-04-14 MED ORDER — DOCUSATE SODIUM 100 MG PO CAPS
100.0000 mg | ORAL_CAPSULE | Freq: Two times a day (BID) | ORAL | Status: DC
  Administered 2020-04-15 – 2020-05-05 (×39): 100 mg via ORAL
  Filled 2020-04-14 (×40): qty 1

## 2020-04-14 MED ORDER — HYDROMORPHONE HCL 1 MG/ML IJ SOLN
0.5000 mg | Freq: Once | INTRAMUSCULAR | Status: AC
  Administered 2020-04-14: 0.5 mg via INTRAVENOUS
  Filled 2020-04-14: qty 1

## 2020-04-14 MED ORDER — ACETAMINOPHEN 325 MG PO TABS
650.0000 mg | ORAL_TABLET | Freq: Four times a day (QID) | ORAL | Status: DC | PRN
  Administered 2020-04-16 – 2020-05-05 (×24): 650 mg via ORAL
  Filled 2020-04-14 (×23): qty 2

## 2020-04-14 MED ORDER — ATORVASTATIN CALCIUM 10 MG PO TABS
10.0000 mg | ORAL_TABLET | Freq: Every evening | ORAL | Status: DC
  Administered 2020-04-14 – 2020-05-04 (×20): 10 mg via ORAL
  Filled 2020-04-14 (×21): qty 1

## 2020-04-14 MED ORDER — ACETAMINOPHEN 500 MG PO TABS
1000.0000 mg | ORAL_TABLET | Freq: Every day | ORAL | Status: DC
  Administered 2020-04-14 – 2020-05-04 (×17): 1000 mg via ORAL
  Filled 2020-04-14 (×21): qty 2

## 2020-04-14 MED ORDER — PANTOPRAZOLE SODIUM 40 MG PO TBEC
40.0000 mg | DELAYED_RELEASE_TABLET | Freq: Two times a day (BID) | ORAL | Status: DC
  Administered 2020-04-14 – 2020-05-05 (×40): 40 mg via ORAL
  Filled 2020-04-14 (×41): qty 1

## 2020-04-14 MED ORDER — TETANUS-DIPHTH-ACELL PERTUSSIS 5-2.5-18.5 LF-MCG/0.5 IM SUSY
0.5000 mL | PREFILLED_SYRINGE | Freq: Once | INTRAMUSCULAR | Status: AC
  Administered 2020-04-14: 0.5 mL via INTRAMUSCULAR
  Filled 2020-04-14: qty 0.5

## 2020-04-14 MED ORDER — SUCRALFATE 1 G PO TABS
1.0000 g | ORAL_TABLET | Freq: Four times a day (QID) | ORAL | Status: DC
  Administered 2020-04-14 – 2020-04-17 (×9): 1 g via ORAL
  Filled 2020-04-14 (×13): qty 1

## 2020-04-14 MED ORDER — ONDANSETRON HCL 4 MG/2ML IJ SOLN
4.0000 mg | Freq: Once | INTRAMUSCULAR | Status: DC
  Filled 2020-04-14: qty 2

## 2020-04-14 MED ORDER — SENNA 8.6 MG PO TABS
1.0000 | ORAL_TABLET | Freq: Two times a day (BID) | ORAL | Status: DC
  Administered 2020-04-15 – 2020-05-04 (×37): 8.6 mg via ORAL
  Filled 2020-04-14 (×38): qty 1

## 2020-04-14 MED ORDER — VITAMIN D (ERGOCALCIFEROL) 1.25 MG (50000 UNIT) PO CAPS
50000.0000 [IU] | ORAL_CAPSULE | ORAL | Status: DC
  Administered 2020-04-16 – 2020-05-03 (×11): 50000 [IU] via ORAL
  Filled 2020-04-14 (×13): qty 1

## 2020-04-14 MED ORDER — METFORMIN HCL 500 MG PO TABS: 500.0000 mg | ORAL_TABLET | ORAL | Status: DC

## 2020-04-14 MED ORDER — ONDANSETRON HCL 4 MG/2ML IJ SOLN
4.0000 mg | Freq: Four times a day (QID) | INTRAMUSCULAR | Status: DC | PRN
  Administered 2020-04-16 – 2020-05-04 (×7): 4 mg via INTRAVENOUS
  Filled 2020-04-14 (×7): qty 2

## 2020-04-14 MED ORDER — ETANERCEPT 50 MG/ML ~~LOC~~ SOSY
50.0000 mg | PREFILLED_SYRINGE | SUBCUTANEOUS | Status: DC
  Filled 2020-04-14: qty 0.98

## 2020-04-14 MED ORDER — ONDANSETRON HCL 4 MG/2ML IJ SOLN
4.0000 mg | Freq: Once | INTRAMUSCULAR | Status: AC
  Administered 2020-04-14: 4 mg via INTRAVENOUS

## 2020-04-14 MED ORDER — DIPHENHYDRAMINE-APAP (SLEEP) 25-500 MG PO TABS: 4.0000 | ORAL_TABLET | Freq: Every day | ORAL | Status: DC

## 2020-04-14 NOTE — ED Provider Notes (Signed)
  Procedures Wound repair  Date/Time: 04/14/2020 8:18 PM Performed by: Darrick Huntsman, MD Authorized by: Lajean Saver, MD  Consent: Verbal consent obtained. Consent given by: patient Required items: required blood products, implants, devices, and special equipment available Local anesthesia used: yes  Anesthesia: Local anesthesia used: yes Local Anesthetic: lidocaine 1% with epinephrine Patient tolerance: patient tolerated the procedure well with no immediate complications Comments: Wound copiously irrigated with saline. No foreign bodies, wound fairly superficial. No active bleeding. Abrasion overlying large hematoma, 3 staples placed to reduce tension on center of hematoma/abrasion       Darrick Huntsman, MD 04/14/20 2020    Lajean Saver, MD 04/15/20 1424

## 2020-04-14 NOTE — ED Notes (Signed)
Patient in MRI 

## 2020-04-14 NOTE — ED Notes (Signed)
C-collar placed on pt with RN and MD assistance.

## 2020-04-14 NOTE — Consult Note (Signed)
Reason for Consult: Cervical spine fractures Referring Physician: ER Dr. Ashok Cordia  Leah Franco is an 54 y.o. female.  HPI: 54 year old female with complicated spine history sounds like previous history of juvenile scoliosis has had over 9 surgeries of her spine most recent which was done by Dr. Prince Rome at [facility].  However she has had chronic issues with her hip and leg giving way on and she fell down the stairs earlier today denies any loss of consciousness denies any new numbness or tingling of arms or legs complains of neck pain and headache.  Past Medical History:  Diagnosis Date  . Anemia   . Arthritis   . Back pain   . Diabetes mellitus without complication ([place])   . Fatigue   . Fibroids   . GI bleeding 03/2020  . Hemorrhoid   . Hypertension   . Mitral valve prolapse   . MRSA (methicillin resistant Staphylococcus aureus)   . Night sweats   . Wound infection     Past Surgical History:  Procedure Laterality Date  . BACK SURGERY    . BREAST LUMPECTOMY     lft  . ESOPHAGOGASTRODUODENOSCOPY  03/12/2020   HOT HEMOSTASIS (ARGON PLASMA COAGULATION/BICAP) (N/A )  . ESOPHAGOGASTRODUODENOSCOPY (EGD) WITH PROPOFOL N/A 03/12/2020   Procedure: ESOPHAGOGASTRODUODENOSCOPY (EGD) WITH PROPOFOL;  Surgeon: Clarene Essex, MD;  Location: [facility];  Service: Endoscopy;  Laterality: N/A;  . GASTRIC BYPASS    . HERNIA REPAIR    . HOT HEMOSTASIS N/A 03/12/2020   Procedure: HOT HEMOSTASIS (ARGON PLASMA COAGULATION/BICAP);  Surgeon: Clarene Essex, MD;  Location: [facility];  Service: Endoscopy;  Laterality: N/A;  . SPINE SURGERY     corrective spine collapsed, scoliosis and scoliosis revision  . TOTAL HIP ARTHROPLASTY  2018    Family History  Problem Relation Age of Onset  . BRCA 1/2 Mother        BRCA2+ by default, no testing  . Breast cancer Sister 18       BRCA2+  . BRCA 1/2 Sister 91       BRCA2 +  . Pancreatic cancer Maternal Uncle        Dx in his 90s  . BRCA 1/2 Maternal Uncle         BRCA2+  . Prostate cancer Maternal Grandfather   . Breast cancer Maternal Aunt        dx in 69s  . Ovarian cancer Maternal Aunt        dx in 15s  . Esophageal cancer Maternal Uncle 62  . BRCA 1/2 Maternal Uncle        BRCA2+  . Colon cancer Maternal Uncle 59  . Breast cancer Cousin        dx in her 89s  . BRCA 1/2 Cousin        dx in her 94s; BRCA2+    Social History:  reports that she has never smoked. She has never used smokeless tobacco. She reports that she does not drink alcohol and does not use drugs.  Allergies:  Allergies  Allergen Reactions  . Lactose Intolerance (Gi) Nausea And Vomiting and Other (See Comments)    gassy  . Tape Rash    Reaction to most kinds of adhesive    Medications: I have reviewed the patient's current medications.  No results found for this or any previous visit (from the past 48 hour(s)).  DG Thoracic Spine 2 View  Result Date: 04/14/2020 CLINICAL DATA:  Status post fall. EXAM: THORACIC  SPINE 2 VIEWS COMPARISON:  March 17, 2020 FINDINGS: There is no evidence of an acute thoracic spine fracture. A chronic compression fracture deformity is seen involving the L2 vertebral body. Multiple bilateral radiopaque pedicle screws are noted throughout the thoracolumbar spine. Stable moderate to marked severity S-shaped scoliosis of the thoracolumbar spine is present. No other significant bone abnormalities are identified. IMPRESSION: 1. Stable chronic compression fracture deformity of the L2 vertebral body. 2. Extensive postoperative changes within the thoracolumbar spine with a stable moderate to marked severity S-shaped scoliosis. Electronically Signed   By: Virgina Norfolk M.D.   On: 04/14/2020 16:13   DG Shoulder Right  Result Date: 04/14/2020 CLINICAL DATA:  Status post fall. EXAM: RIGHT SHOULDER - 2+ VIEW COMPARISON:  None. FINDINGS: There is no evidence of an acute fracture or dislocation. There is no evidence of arthropathy. A chronic seventh  right rib fracture is seen. Extensive postoperative changes are seen throughout the visualized portion of the thoracic spine. Soft tissues are unremarkable. IMPRESSION: No acute fracture or dislocation. Electronically Signed   By: Virgina Norfolk M.D.   On: 04/14/2020 16:19   CT HEAD WO CONTRAST  Result Date: 04/14/2020 CLINICAL DATA:  Head trauma, moderate/severe. Polytrauma, critical, head/cervical spine injury suspected. Additional history provided: Fall. EXAM: CT HEAD WITHOUT CONTRAST CT CERVICAL SPINE WITHOUT CONTRAST TECHNIQUE: Multidetector CT imaging of the head and cervical spine was performed following the standard protocol without intravenous contrast. Multiplanar CT image reconstructions of the cervical spine were also generated. COMPARISON:  Head CT 04/08/2020. cervical spine MRI 08/01/2017. FINDINGS: CT HEAD FINDINGS Brain: Mild cerebral and cerebellar atrophy. There is no acute intracranial hemorrhage. No demarcated cortical infarct. No extra-axial fluid collection. No evidence of intracranial mass. No midline shift. Vascular: No hyperdense vessel.  Atherosclerotic calcifications. Skull: Nondepressed left occipital calvarial fracture. Sinuses/Orbits: Visualized orbits show no acute finding. Trace ethmoid sinus mucosal thickening. Other: Very large biparietal scalp hematoma. Associated gas within the scalp at this site suggestive of laceration CT CERVICAL SPINE FINDINGS Alignment: Reversal of the expected cervical lordosis. 3 mm C3-C4 grade 1 anterolisthesis. Subtle widening of the left C3-C4 facet joint (series 8, image 48). Skull base and vertebrae: The basion-dental and atlanto-dental intervals are maintained. Acute minimally displaced fractures of the C1 posterior arch bilaterally. Acute type 3 odontoid fracture. 2 mm anterior displacement of the fracture fragment. Acute, minimally displaced fracture of the left C5 lamina extending into the spinous process. Fractures of the C6 and C7 spinous  processes appear chronic. Partially visualized posterior spinal fusion construct extending into the thoracic spine inferiorly from the T2 level. Soft tissues and spinal canal: No prevertebral fluid or swelling. No visible canal hematoma. Soft tissue stranding within the posterior neck, likely posttraumatic. Disc levels: Cervical spondylosis. Most notably at C5-C6 and C6-C7, there is advanced disc degeneration with posterior disc osteophytes and uncovertebral hypertrophy. Upper chest: No consolidation within the imaged lung apices. No visible pneumothorax. These results were called by telephone at the time of interpretation on 04/14/2020 at 4:50 pm to provider Dr. Ashok Cordia, who verbally acknowledged these results. IMPRESSION: CT head: 1. No evidence of acute intracranial abnormality. 2. Nondepressed left occipital calvarial fracture. 3. Very large biparietal scalp hematoma. Associated gas within the scalp at this site suggestive of laceration. 4. Mild atrophy of the brain. CT cervical spine: 1. Acute, mildly displaced type 3 odontoid fracture. 2. Acute minimally displaced fractures of the C1 posterior arch bilaterally. 3. Acute, mildly displaced fracture of the left C5 lamina extending into  the spinous process. 4. Subtle widening of the left C3-C4 facet joint with 3 mm grade 1 anterolisthesis at this level. Cervical spine MRI is recommended to assess for ligamentous injury. 5. Fractures of the C6 and C7 spinous processes appear chronic. 6. Nonspecific reversal of the expected cervical lordosis. 7. Cervical spondylosis as described and greatest at C5-C6 and C6-C7. Electronically Signed   By: Kellie Simmering DO   On: 04/14/2020 16:51   CT CERVICAL SPINE WO CONTRAST  Result Date: 04/14/2020 CLINICAL DATA:  Head trauma, moderate/severe. Polytrauma, critical, head/cervical spine injury suspected. Additional history provided: Fall. EXAM: CT HEAD WITHOUT CONTRAST CT CERVICAL SPINE WITHOUT CONTRAST TECHNIQUE: Multidetector CT  imaging of the head and cervical spine was performed following the standard protocol without intravenous contrast. Multiplanar CT image reconstructions of the cervical spine were also generated. COMPARISON:  Head CT 04/08/2020. cervical spine MRI 08/01/2017. FINDINGS: CT HEAD FINDINGS Brain: Mild cerebral and cerebellar atrophy. There is no acute intracranial hemorrhage. No demarcated cortical infarct. No extra-axial fluid collection. No evidence of intracranial mass. No midline shift. Vascular: No hyperdense vessel.  Atherosclerotic calcifications. Skull: Nondepressed left occipital calvarial fracture. Sinuses/Orbits: Visualized orbits show no acute finding. Trace ethmoid sinus mucosal thickening. Other: Very large biparietal scalp hematoma. Associated gas within the scalp at this site suggestive of laceration CT CERVICAL SPINE FINDINGS Alignment: Reversal of the expected cervical lordosis. 3 mm C3-C4 grade 1 anterolisthesis. Subtle widening of the left C3-C4 facet joint (series 8, image 48). Skull base and vertebrae: The basion-dental and atlanto-dental intervals are maintained. Acute minimally displaced fractures of the C1 posterior arch bilaterally. Acute type 3 odontoid fracture. 2 mm anterior displacement of the fracture fragment. Acute, minimally displaced fracture of the left C5 lamina extending into the spinous process. Fractures of the C6 and C7 spinous processes appear chronic. Partially visualized posterior spinal fusion construct extending into the thoracic spine inferiorly from the T2 level. Soft tissues and spinal canal: No prevertebral fluid or swelling. No visible canal hematoma. Soft tissue stranding within the posterior neck, likely posttraumatic. Disc levels: Cervical spondylosis. Most notably at C5-C6 and C6-C7, there is advanced disc degeneration with posterior disc osteophytes and uncovertebral hypertrophy. Upper chest: No consolidation within the imaged lung apices. No visible pneumothorax.  These results were called by telephone at the time of interpretation on 04/14/2020 at 4:50 pm to provider Dr. Ashok Cordia, who verbally acknowledged these results. IMPRESSION: CT head: 1. No evidence of acute intracranial abnormality. 2. Nondepressed left occipital calvarial fracture. 3. Very large biparietal scalp hematoma. Associated gas within the scalp at this site suggestive of laceration. 4. Mild atrophy of the brain. CT cervical spine: 1. Acute, mildly displaced type 3 odontoid fracture. 2. Acute minimally displaced fractures of the C1 posterior arch bilaterally. 3. Acute, mildly displaced fracture of the left C5 lamina extending into the spinous process. 4. Subtle widening of the left C3-C4 facet joint with 3 mm grade 1 anterolisthesis at this level. Cervical spine MRI is recommended to assess for ligamentous injury. 5. Fractures of the C6 and C7 spinous processes appear chronic. 6. Nonspecific reversal of the expected cervical lordosis. 7. Cervical spondylosis as described and greatest at C5-C6 and C6-C7. Electronically Signed   By: Kellie Simmering DO   On: 04/14/2020 16:51   DG Shoulder Left  Result Date: 04/14/2020 CLINICAL DATA:  Status post fall. EXAM: LEFT SHOULDER - 2+ VIEW COMPARISON:  None. FINDINGS: There is no evidence of an acute fracture or dislocation. There is no evidence of  arthropathy or other focal bone abnormality. Extensive postoperative changes seen throughout the visualized portion of the thoracic spine. Soft tissues are unremarkable. IMPRESSION: No acute fracture or dislocation. Electronically Signed   By: Virgina Norfolk M.D.   On: 04/14/2020 16:14    Review of Systems  Musculoskeletal: Positive for neck pain.  Neurological: Positive for headaches.   Blood pressure (!) 151/101, pulse 86, temperature 98 F (36.7 C), temperature source Oral, resp. rate 16, height 5' 3"  (1.6 m), weight 85.7 kg, last menstrual period 11/04/2010, SpO2 98 %. Physical Exam Neurological:     Comments:  Patient is awake and alert the complaining of neck pain tenderness strength is 5 out of 5 upper and lower extremities bilaterally sinus left-sided foot drop     Assessment/Plan: 54 year old female with a type III odontoid fracture widened C3-4 facet smaller lamina fractures possible might heal in a collar although I am concerned about the 1 facet, also concerned of a type III odontoid may actually act more like a type II.  We will change her over to a [place] J collar obtain cervical spine MRI scan and awaiting MRI will determine disposition.  Certainly might be her best interest to heal in a cervical collar however I am not sure type III odontoid might have to be operated on as well as anxious to see degree of ligamentous injury.  Elaina Hoops 04/14/2020, 5:33 PM

## 2020-04-14 NOTE — ED Triage Notes (Signed)
Patient presents to ed via [facility] states she was going up some steps and fell hitting her head on the baseboard. Small laceration to left upper head , bleeding controlled. Denies loc c/o neck pain and right shoulder pain. States she fell 2 weeks ago and has a hematoma to the back of her head. Currently alert and oriented.

## 2020-04-14 NOTE — ED Provider Notes (Signed)
Patient signed out at 1630 to check ct results and address scalp lac.   CT with odontoid fx, spinal process fx/facet fx  Patient kept in ccollar with cspine precautions throughout ED stay. [place] Jcollar applied. NS consulted.   Dr Saintclair Halsted requests MRI. MRI obtained, reviewed by NS. Dr Saintclair Halsted indicates keep in collar, f/u Thursday if able, call back if requires admission for pain management.   Pain meds provided.   Pt/family request admission re pain/pain control - NS recontacted and will admit.   Left frontal/parietal scalp lac repair by resident MD - I was present during procedure.   Recheck pt, no distress. No new numbness or weakness. NS admitting.    Lajean Saver, MD 04/14/20 2016

## 2020-04-14 NOTE — ED Provider Notes (Addendum)
[facility] EMERGENCY DEPARTMENT Provider Note   CSN: [id number] Arrival date & time: 04/14/20  1418     History Chief Complaint  Patient presents with  . Fall    Leah Franco is a 54 y.o. female.  HPI   Patient has a history of neuropathy and foot drop in her left leg.  Patient states she was trying to walk up steps when her leg gave way and she ended up falling.  Patient struck her head.  She is having a headache as well as back pain.  She also has some pain in her shoulder.  Patient denies any loss of consciousness.  No chest pain.  No abdominal pain.  No new numbness or weakness.  Patient is having a significant headache from her fall.  Past Medical History:  Diagnosis Date  . Anemia   . Arthritis   . Back pain   . Diabetes mellitus without complication ([place])   . Fatigue   . Fibroids   . GI bleeding 03/2020  . Hemorrhoid   . Hypertension   . Mitral valve prolapse   . MRSA (methicillin resistant Staphylococcus aureus)   . Night sweats   . Wound infection     Patient Active Problem List   Diagnosis Date Noted  . GIB (gastrointestinal bleeding) 03/12/2020  . Arterial insufficiency ([place]) 11/25/2010  . MRSA (methicillin resistant Staphylococcus aureus) 11/25/2010  . COPD (chronic obstructive pulmonary disease) ([place]) 11/25/2010  . CHF (congestive heart failure) ([place]) 11/25/2010  . Spinal cord injury 11/25/2010  . CAD (coronary artery disease) 11/25/2010  . CVA (cerebral infarction) 11/25/2010  . TIA (transient ischemic attack) 11/25/2010  . Diabetes mellitus 11/25/2010  . Neuropathy 11/25/2010  . End stage renal disease ([place]) 11/25/2010  . Dialysis care 11/25/2010  . Lupus ([place]) 11/25/2010  . Lymphedema 11/25/2010  . Scoliosis 11/25/2010  . Abscess 11/25/2010    Past Surgical History:  Procedure Laterality Date  . BACK SURGERY    . BREAST LUMPECTOMY     lft  . ESOPHAGOGASTRODUODENOSCOPY  03/12/2020   HOT HEMOSTASIS (ARGON PLASMA  COAGULATION/BICAP) (N/A )  . ESOPHAGOGASTRODUODENOSCOPY (EGD) WITH PROPOFOL N/A 03/12/2020   Procedure: ESOPHAGOGASTRODUODENOSCOPY (EGD) WITH PROPOFOL;  Surgeon: Clarene Essex, MD;  Location: [facility];  Service: Endoscopy;  Laterality: N/A;  . GASTRIC BYPASS    . HERNIA REPAIR    . HOT HEMOSTASIS N/A 03/12/2020   Procedure: HOT HEMOSTASIS (ARGON PLASMA COAGULATION/BICAP);  Surgeon: Clarene Essex, MD;  Location: [facility];  Service: Endoscopy;  Laterality: N/A;  . SPINE SURGERY     corrective spine collapsed, scoliosis and scoliosis revision  . TOTAL HIP ARTHROPLASTY  2018     OB History   No obstetric history on file.     Family History  Problem Relation Age of Onset  . BRCA 1/2 Mother        BRCA2+ by default, no testing  . Breast cancer Sister 49       BRCA2+  . BRCA 1/2 Sister 7       BRCA2 +  . Pancreatic cancer Maternal Uncle        Dx in his 79s  . BRCA 1/2 Maternal Uncle        BRCA2+  . Prostate cancer Maternal Grandfather   . Breast cancer Maternal Aunt        dx in 74s  . Ovarian cancer Maternal Aunt        dx in 68s  . Esophageal cancer  Maternal Uncle 62  . BRCA 1/2 Maternal Uncle        BRCA2+  . Colon cancer Maternal Uncle 87  . Breast cancer Cousin        dx in her 45s  . BRCA 1/2 Cousin        dx in her 33s; BRCA2+    Social History   Tobacco Use  . Smoking status: Never Smoker  . Smokeless tobacco: Never Used  Vaping Use  . Vaping Use: Never used  Substance Use Topics  . Alcohol use: No    Comment: rare  . Drug use: No    Home Medications Prior to Admission medications   Medication Sig Start Date End Date Taking? Authorizing Provider  acetaminophen (TYLENOL) 500 MG tablet Take 1,000 mg by mouth every 6 (six) hours as needed for mild pain, fever or headache.    [provider]  atorvastatin (LIPITOR) 10 MG tablet Take 10 mg by mouth daily. 10/02/19   [provider]  denosumab (PROLIA) 60 MG/ML SOSY injection Inject 60 mg  into the skin every 6 (six) months.    [provider]  ENBREL SURECLICK 50 MG/ML injection Inject 1 mL into the skin once a week. 10/30/19   [provider]  ergocalciferol (VITAMIN D2) 50000 UNITS capsule Take 50,000 Units by mouth 2 (two) times a week.      [provider]  gabapentin (NEURONTIN) 300 MG capsule Take 600 mg by mouth 3 (three) times daily.    [provider]  gabapentin (NEURONTIN) 800 MG tablet Take 1 tablet by mouth every evening. 11/29/19 11/28/20  [provider]  metFORMIN (GLUCOPHAGE) 500 MG tablet Take 1,000 mg by mouth 2 (two) times daily.  04/05/16 01/14/21  [provider]  metoprolol tartrate (LOPRESSOR) 25 MG tablet Take 25 mg by mouth 2 (two) times daily.  03/06/20   [provider]  pantoprazole (PROTONIX) 40 MG tablet Take 1 tablet (40 mg total) by mouth 2 (two) times daily. 03/14/20 03/14/21  Nita Sells, MD  Semaglutide,0.25 or 0.5MG/DOS, (OZEMPIC, 0.25 OR 0.5 MG/DOSE,) 2 MG/1.5ML SOPN Inject 0.5 mg into the skin once a week. 01/15/20   [provider]  sucralfate (CARAFATE) 1 g tablet Take 1 tablet (1 g total) by mouth 4 (four) times daily. 03/14/20 04/13/20  Nita Sells, MD    Allergies    Lactose and Tape  Review of Systems   Review of Systems  All other systems reviewed and are negative.   Physical Exam Updated Vital Signs BP (!) 151/101 (BP Location: Left Arm)   Pulse 86   Temp 98 F (36.7 C) (Oral)   Resp 16   Ht 1.6 m (5' 3" )   Wt 85.7 kg   LMP 11/04/2010   SpO2 98%   BMI 33.48 kg/m   Physical Exam Vitals and nursing note reviewed.  Constitutional:      General: She is not in acute distress.    Appearance: She is well-developed.  HENT:     Head: Normocephalic.     Comments: 2.5 cm Laceration left forehead    Right Ear: External ear normal.     Left Ear: External ear normal.  Eyes:     General: No scleral icterus.       Right eye: No discharge.         Left eye: No discharge.     Conjunctiva/sclera: Conjunctivae normal.  Neck:     Trachea: No tracheal deviation.  Cardiovascular:     Rate and Rhythm: Normal rate and regular rhythm.  Pulmonary:     Effort: Pulmonary effort is normal. No respiratory distress.     Breath sounds: Normal breath sounds. No stridor. No wheezing or rales.  Abdominal:     General: Bowel sounds are normal. There is no distension.     Palpations: Abdomen is soft.     Tenderness: There is no abdominal tenderness. There is no guarding or rebound.  Musculoskeletal:     Cervical back: Neck supple.     Comments: Cervical and thoracic spine tenderness, no lumbar spine tenderness.  No tenderness palpation hips knees or ankles cervical and thoracic spine tenderness.   Skin:    General: Skin is warm and dry.     Findings: No rash.  Neurological:     Mental Status: She is alert.     Cranial Nerves: No cranial nerve deficit (no facial droop, extraocular movements intact, no slurred speech).     Sensory: No sensory deficit.     Motor: No abnormal muscle tone or seizure activity.     Coordination: Coordination normal.     ED Results / Procedures / Treatments   Labs (all labs ordered are listed, but only abnormal results are displayed) Labs Reviewed  CBC  BASIC METABOLIC PANEL    EKG None  Radiology No results found.  Procedures Procedures (including critical care time)  Medications Ordered in ED Medications  morphine 4 MG/ML injection 4 mg (has no administration in time range)  ondansetron (ZOFRAN) injection 4 mg (has no administration in time range)    ED Course  I have reviewed the triage vital signs and the nursing notes.  Pertinent labs & imaging results that were available during my care of the patient were reviewed by me and considered in my medical decision making (see chart for details).    MDM Rules/Calculators/A&P                          Pt presented to the ED after a fall.   Hx of  some gait instability.  Labs and xrays ordered.  Pt will need laceration repair of forehead.  Care turned over to oncoming team.  Final Clinical Impression(s) / ED Diagnoses Final diagnoses:  Fall, initial encounter  Laceration of forehead, initial encounter    Rx / DC Orders ED Discharge Orders    None       Dorie Rank, MD 04/14/20 1544    Dorie Rank, MD 05/05/20 0900

## 2020-04-15 ENCOUNTER — Other Ambulatory Visit: Payer: Self-pay

## 2020-04-15 DIAGNOSIS — S12110A Anterior displaced Type II dens fracture, initial encounter for closed fracture: Secondary | ICD-10-CM | POA: Diagnosis present

## 2020-04-15 DIAGNOSIS — S0181XA Laceration without foreign body of other part of head, initial encounter: Secondary | ICD-10-CM | POA: Diagnosis present

## 2020-04-15 DIAGNOSIS — S0003XA Contusion of scalp, initial encounter: Secondary | ICD-10-CM | POA: Diagnosis present

## 2020-04-15 DIAGNOSIS — R140 Abdominal distension (gaseous): Secondary | ICD-10-CM | POA: Diagnosis not present

## 2020-04-15 DIAGNOSIS — T148XXA Other injury of unspecified body region, initial encounter: Secondary | ICD-10-CM | POA: Diagnosis not present

## 2020-04-15 DIAGNOSIS — Z7984 Long term (current) use of oral hypoglycemic drugs: Secondary | ICD-10-CM | POA: Diagnosis not present

## 2020-04-15 DIAGNOSIS — K219 Gastro-esophageal reflux disease without esophagitis: Secondary | ICD-10-CM | POA: Diagnosis present

## 2020-04-15 DIAGNOSIS — S12400A Unspecified displaced fracture of fifth cervical vertebra, initial encounter for closed fracture: Secondary | ICD-10-CM | POA: Diagnosis not present

## 2020-04-15 DIAGNOSIS — R5381 Other malaise: Secondary | ICD-10-CM | POA: Diagnosis present

## 2020-04-15 DIAGNOSIS — S0101XA Laceration without foreign body of scalp, initial encounter: Secondary | ICD-10-CM | POA: Diagnosis present

## 2020-04-15 DIAGNOSIS — E1140 Type 2 diabetes mellitus with diabetic neuropathy, unspecified: Secondary | ICD-10-CM | POA: Diagnosis not present

## 2020-04-15 DIAGNOSIS — E1151 Type 2 diabetes mellitus with diabetic peripheral angiopathy without gangrene: Secondary | ICD-10-CM | POA: Diagnosis present

## 2020-04-15 DIAGNOSIS — I2510 Atherosclerotic heart disease of native coronary artery without angina pectoris: Secondary | ICD-10-CM | POA: Diagnosis present

## 2020-04-15 DIAGNOSIS — W010XXA Fall on same level from slipping, tripping and stumbling without subsequent striking against object, initial encounter: Secondary | ICD-10-CM | POA: Diagnosis not present

## 2020-04-15 DIAGNOSIS — Z96649 Presence of unspecified artificial hip joint: Secondary | ICD-10-CM | POA: Diagnosis present

## 2020-04-15 DIAGNOSIS — S12100D Unspecified displaced fracture of second cervical vertebra, subsequent encounter for fracture with routine healing: Secondary | ICD-10-CM | POA: Diagnosis not present

## 2020-04-15 DIAGNOSIS — R609 Edema, unspecified: Secondary | ICD-10-CM | POA: Diagnosis not present

## 2020-04-15 DIAGNOSIS — L538 Other specified erythematous conditions: Secondary | ICD-10-CM | POA: Diagnosis not present

## 2020-04-15 DIAGNOSIS — E8809 Other disorders of plasma-protein metabolism, not elsewhere classified: Secondary | ICD-10-CM | POA: Diagnosis present

## 2020-04-15 DIAGNOSIS — M21372 Foot drop, left foot: Secondary | ICD-10-CM | POA: Diagnosis present

## 2020-04-15 DIAGNOSIS — E669 Obesity, unspecified: Secondary | ICD-10-CM | POA: Diagnosis present

## 2020-04-15 DIAGNOSIS — Z9884 Bariatric surgery status: Secondary | ICD-10-CM | POA: Diagnosis not present

## 2020-04-15 DIAGNOSIS — Y92008 Other place in unspecified non-institutional (private) residence as the place of occurrence of the external cause: Secondary | ICD-10-CM | POA: Diagnosis not present

## 2020-04-15 DIAGNOSIS — Z79899 Other long term (current) drug therapy: Secondary | ICD-10-CM | POA: Diagnosis not present

## 2020-04-15 DIAGNOSIS — W19XXXA Unspecified fall, initial encounter: Secondary | ICD-10-CM | POA: Diagnosis not present

## 2020-04-15 DIAGNOSIS — R296 Repeated falls: Secondary | ICD-10-CM | POA: Diagnosis present

## 2020-04-15 DIAGNOSIS — W108XXA Fall (on) (from) other stairs and steps, initial encounter: Secondary | ICD-10-CM | POA: Diagnosis present

## 2020-04-15 DIAGNOSIS — R0989 Other specified symptoms and signs involving the circulatory and respiratory systems: Secondary | ICD-10-CM | POA: Diagnosis not present

## 2020-04-15 DIAGNOSIS — D539 Nutritional anemia, unspecified: Secondary | ICD-10-CM | POA: Diagnosis present

## 2020-04-15 DIAGNOSIS — S129XXA Fracture of neck, unspecified, initial encounter: Secondary | ICD-10-CM | POA: Diagnosis not present

## 2020-04-15 DIAGNOSIS — I890 Lymphedema, not elsewhere classified: Secondary | ICD-10-CM | POA: Diagnosis present

## 2020-04-15 DIAGNOSIS — I10 Essential (primary) hypertension: Secondary | ICD-10-CM | POA: Diagnosis present

## 2020-04-15 DIAGNOSIS — S12030A Displaced posterior arch fracture of first cervical vertebra, initial encounter for closed fracture: Secondary | ICD-10-CM | POA: Diagnosis present

## 2020-04-15 DIAGNOSIS — E1142 Type 2 diabetes mellitus with diabetic polyneuropathy: Secondary | ICD-10-CM | POA: Diagnosis present

## 2020-04-15 DIAGNOSIS — Z20822 Contact with and (suspected) exposure to covid-19: Secondary | ICD-10-CM | POA: Diagnosis present

## 2020-04-15 DIAGNOSIS — S12100A Unspecified displaced fracture of second cervical vertebra, initial encounter for closed fracture: Secondary | ICD-10-CM | POA: Diagnosis not present

## 2020-04-15 DIAGNOSIS — E785 Hyperlipidemia, unspecified: Secondary | ICD-10-CM | POA: Diagnosis present

## 2020-04-15 DIAGNOSIS — E872 Acidosis: Secondary | ICD-10-CM | POA: Diagnosis present

## 2020-04-15 DIAGNOSIS — M542 Cervicalgia: Secondary | ICD-10-CM | POA: Diagnosis present

## 2020-04-15 LAB — MRSA PCR SCREENING: MRSA by PCR: NEGATIVE

## 2020-04-15 MED ORDER — PHENOL 1.4 % MT LIQD
1.0000 | OROMUCOSAL | Status: DC | PRN
  Administered 2020-04-16 – 2020-04-21 (×2): 1 via OROMUCOSAL
  Filled 2020-04-15 (×2): qty 177

## 2020-04-15 MED ORDER — ENSURE ENLIVE PO LIQD
237.0000 mL | Freq: Two times a day (BID) | ORAL | Status: DC
  Administered 2020-04-15 – 2020-04-22 (×10): 237 mL via ORAL

## 2020-04-15 MED ORDER — PNEUMOCOCCAL VAC POLYVALENT 25 MCG/0.5ML IJ INJ
0.5000 mL | INJECTION | INTRAMUSCULAR | Status: AC
  Administered 2020-04-16: 0.5 mL via INTRAMUSCULAR
  Filled 2020-04-15: qty 0.5

## 2020-04-15 NOTE — Progress Notes (Signed)
Received report from [facility], Therapist, sports.  Patient states medical history is incorrect and that she does not have Lupus, ESRD, or CHF.

## 2020-04-15 NOTE — ED Notes (Signed)
Attempted report x1. 

## 2020-04-15 NOTE — Progress Notes (Signed)
Subjective: Patient reports Condition neck pain denies any radicular symptoms  Objective: Vital signs in last 24 hours: Temp:  [98 F (36.7 C)-98.7 F (37.1 C)] 98.4 F (36.9 C) (12/07 0749) Pulse Rate:  [80-109] 83 (12/07 0749) Resp:  [16-20] 18 (12/07 0749) BP: (93-151)/(53-101) 103/82 (12/07 0749) SpO2:  [85 %-99 %] 93 % (12/07 0749) Weight:  [85.7 kg] 85.7 kg (12/06 1429)  Intake/Output from previous day: No intake/output data recorded. Intake/Output this shift: No intake/output data recorded.  Strength 5/5 wound clean dry and intact  Lab Results: Recent Labs    04/14/20 2013  WBC 9.7  HGB 11.2*  HCT 37.1  PLT 432*   BMET Recent Labs    04/14/20 2013  NA 139  K 3.7  CL 102  CO2 25  GLUCOSE 115*  BUN 15  CREATININE 0.51  CALCIUM 8.1*    Studies/Results: DG Thoracic Spine 2 View  Result Date: 04/14/2020 CLINICAL DATA:  Status post fall. EXAM: THORACIC SPINE 2 VIEWS COMPARISON:  March 17, 2020 FINDINGS: There is no evidence of an acute thoracic spine fracture. A chronic compression fracture deformity is seen involving the L2 vertebral body. Multiple bilateral radiopaque pedicle screws are noted throughout the thoracolumbar spine. Stable moderate to marked severity S-shaped scoliosis of the thoracolumbar spine is present. No other significant bone abnormalities are identified. IMPRESSION: 1. Stable chronic compression fracture deformity of the L2 vertebral body. 2. Extensive postoperative changes within the thoracolumbar spine with a stable moderate to marked severity S-shaped scoliosis. Electronically Signed   By: Virgina Norfolk M.D.   On: 04/14/2020 16:13   DG Shoulder Right  Result Date: 04/14/2020 CLINICAL DATA:  Status post fall. EXAM: RIGHT SHOULDER - 2+ VIEW COMPARISON:  None. FINDINGS: There is no evidence of an acute fracture or dislocation. There is no evidence of arthropathy. A chronic seventh right rib fracture is seen. Extensive postoperative  changes are seen throughout the visualized portion of the thoracic spine. Soft tissues are unremarkable. IMPRESSION: No acute fracture or dislocation. Electronically Signed   By: Virgina Norfolk M.D.   On: 04/14/2020 16:19   CT HEAD WO CONTRAST  Result Date: 04/14/2020 CLINICAL DATA:  Head trauma, moderate/severe. Polytrauma, critical, head/cervical spine injury suspected. Additional history provided: Fall. EXAM: CT HEAD WITHOUT CONTRAST CT CERVICAL SPINE WITHOUT CONTRAST TECHNIQUE: Multidetector CT imaging of the head and cervical spine was performed following the standard protocol without intravenous contrast. Multiplanar CT image reconstructions of the cervical spine were also generated. COMPARISON:  Head CT 04/08/2020. cervical spine MRI 08/01/2017. FINDINGS: CT HEAD FINDINGS Brain: Mild cerebral and cerebellar atrophy. There is no acute intracranial hemorrhage. No demarcated cortical infarct. No extra-axial fluid collection. No evidence of intracranial mass. No midline shift. Vascular: No hyperdense vessel.  Atherosclerotic calcifications. Skull: Nondepressed left occipital calvarial fracture. Sinuses/Orbits: Visualized orbits show no acute finding. Trace ethmoid sinus mucosal thickening. Other: Very large biparietal scalp hematoma. Associated gas within the scalp at this site suggestive of laceration CT CERVICAL SPINE FINDINGS Alignment: Reversal of the expected cervical lordosis. 3 mm C3-C4 grade 1 anterolisthesis. Subtle widening of the left C3-C4 facet joint (series 8, image 48). Skull base and vertebrae: The basion-dental and atlanto-dental intervals are maintained. Acute minimally displaced fractures of the C1 posterior arch bilaterally. Acute type 3 odontoid fracture. 2 mm anterior displacement of the fracture fragment. Acute, minimally displaced fracture of the left C5 lamina extending into the spinous process. Fractures of the C6 and C7 spinous processes appear chronic. Partially visualized  posterior spinal fusion construct extending into the thoracic spine inferiorly from the T2 level. Soft tissues and spinal canal: No prevertebral fluid or swelling. No visible canal hematoma. Soft tissue stranding within the posterior neck, likely posttraumatic. Disc levels: Cervical spondylosis. Most notably at C5-C6 and C6-C7, there is advanced disc degeneration with posterior disc osteophytes and uncovertebral hypertrophy. Upper chest: No consolidation within the imaged lung apices. No visible pneumothorax. These results were called by telephone at the time of interpretation on 04/14/2020 at 4:50 pm to provider Dr. Ashok Cordia, who verbally acknowledged these results. IMPRESSION: CT head: 1. No evidence of acute intracranial abnormality. 2. Nondepressed left occipital calvarial fracture. 3. Very large biparietal scalp hematoma. Associated gas within the scalp at this site suggestive of laceration. 4. Mild atrophy of the brain. CT cervical spine: 1. Acute, mildly displaced type 3 odontoid fracture. 2. Acute minimally displaced fractures of the C1 posterior arch bilaterally. 3. Acute, mildly displaced fracture of the left C5 lamina extending into the spinous process. 4. Subtle widening of the left C3-C4 facet joint with 3 mm grade 1 anterolisthesis at this level. Cervical spine MRI is recommended to assess for ligamentous injury. 5. Fractures of the C6 and C7 spinous processes appear chronic. 6. Nonspecific reversal of the expected cervical lordosis. 7. Cervical spondylosis as described and greatest at C5-C6 and C6-C7. Electronically Signed   By: Kellie Simmering DO   On: 04/14/2020 16:51   CT CERVICAL SPINE WO CONTRAST  Result Date: 04/14/2020 CLINICAL DATA:  Head trauma, moderate/severe. Polytrauma, critical, head/cervical spine injury suspected. Additional history provided: Fall. EXAM: CT HEAD WITHOUT CONTRAST CT CERVICAL SPINE WITHOUT CONTRAST TECHNIQUE: Multidetector CT imaging of the head and cervical spine was  performed following the standard protocol without intravenous contrast. Multiplanar CT image reconstructions of the cervical spine were also generated. COMPARISON:  Head CT 04/08/2020. cervical spine MRI 08/01/2017. FINDINGS: CT HEAD FINDINGS Brain: Mild cerebral and cerebellar atrophy. There is no acute intracranial hemorrhage. No demarcated cortical infarct. No extra-axial fluid collection. No evidence of intracranial mass. No midline shift. Vascular: No hyperdense vessel.  Atherosclerotic calcifications. Skull: Nondepressed left occipital calvarial fracture. Sinuses/Orbits: Visualized orbits show no acute finding. Trace ethmoid sinus mucosal thickening. Other: Very large biparietal scalp hematoma. Associated gas within the scalp at this site suggestive of laceration CT CERVICAL SPINE FINDINGS Alignment: Reversal of the expected cervical lordosis. 3 mm C3-C4 grade 1 anterolisthesis. Subtle widening of the left C3-C4 facet joint (series 8, image 48). Skull base and vertebrae: The basion-dental and atlanto-dental intervals are maintained. Acute minimally displaced fractures of the C1 posterior arch bilaterally. Acute type 3 odontoid fracture. 2 mm anterior displacement of the fracture fragment. Acute, minimally displaced fracture of the left C5 lamina extending into the spinous process. Fractures of the C6 and C7 spinous processes appear chronic. Partially visualized posterior spinal fusion construct extending into the thoracic spine inferiorly from the T2 level. Soft tissues and spinal canal: No prevertebral fluid or swelling. No visible canal hematoma. Soft tissue stranding within the posterior neck, likely posttraumatic. Disc levels: Cervical spondylosis. Most notably at C5-C6 and C6-C7, there is advanced disc degeneration with posterior disc osteophytes and uncovertebral hypertrophy. Upper chest: No consolidation within the imaged lung apices. No visible pneumothorax. These results were called by telephone at  the time of interpretation on 04/14/2020 at 4:50 pm to provider Dr. Ashok Cordia, who verbally acknowledged these results. IMPRESSION: CT head: 1. No evidence of acute intracranial abnormality. 2. Nondepressed left occipital calvarial fracture. 3. Very large  biparietal scalp hematoma. Associated gas within the scalp at this site suggestive of laceration. 4. Mild atrophy of the brain. CT cervical spine: 1. Acute, mildly displaced type 3 odontoid fracture. 2. Acute minimally displaced fractures of the C1 posterior arch bilaterally. 3. Acute, mildly displaced fracture of the left C5 lamina extending into the spinous process. 4. Subtle widening of the left C3-C4 facet joint with 3 mm grade 1 anterolisthesis at this level. Cervical spine MRI is recommended to assess for ligamentous injury. 5. Fractures of the C6 and C7 spinous processes appear chronic. 6. Nonspecific reversal of the expected cervical lordosis. 7. Cervical spondylosis as described and greatest at C5-C6 and C6-C7. Electronically Signed   By: Kellie Simmering DO   On: 04/14/2020 16:51   MR Cervical Spine Wo Contrast  Result Date: 04/14/2020 CLINICAL DATA:  Initial evaluation for acute neck trauma, cervical spine fractures, evaluate for ligamentous injury. EXAM: MRI CERVICAL SPINE WITHOUT CONTRAST TECHNIQUE: Multiplanar, multisequence MR imaging of the cervical spine was performed. No intravenous contrast was administered. COMPARISON:  Prior CT from earlier the same day as well as previous MRI from 08/01/2017. FINDINGS: Alignment: Straightening with smooth reversal of the normal cervical lordosis. Trace 2 mm anterolisthesis of C3 on C4, stable from prior CT as well as previous MRI from 2019, therefore or favored to be chronic in facet mediated. Trace anterolisthesis of T1 on T2 also favored to be chronic and facet mediated. No other listhesis or traumatic static subluxation. Vertebrae: Previously identified oblique minimally displaced type 3 dens fracture again  seen, stable in position and alignment from prior. Acute nondisplaced fractures extending through the posterior arch of C1 bilaterally grossly stable as well, better seen on prior CT. Fractures involving the C5 lamina and spinous process noted, stable in position and alignment, also seen on prior CT. No other acute fracture evident by MRI. Chronic fractures of the C6 and C7 spinous processes again noted. Vertebral body height maintained with no other vertebral body fracture identified. Underlying bone marrow signal intensity within normal limits. No discrete or worrisome osseous lesions. Susceptibility artifact related to posterior fusion extending from T2 through the visualized upper thoracic spine partially visualized. Cord: Normal signal and morphology. No evidence for traumatic cord injury or hemorrhagic contusion. No significant epidural hematoma or other collection. Mild edema seen involving the anterior longitudinal ligament at the level of the acute dens fracture, suggesting ligamentous injury if not frank disruption (series 4, image 6). Posterior longitudinal ligament and ligamentum flavum appear grossly intact. Tectorial membrane appears intact. Mild edema seen within the interspinous soft tissues extending from C2 through C5-6, which could reflect a degree of mild ligamentous strain/injury involving the inter spinous ligaments (series 7, image 4). No other convincing evidence for ligamentous injury. Posterior Fossa, vertebral arteries, paraspinal tissues: Chronic microvascular ischemic disease noted within the pons and brainstem. Visualized portions of the brain demonstrate no other acute finding. Mild soft tissue edema/contusion noted within the posterior subcutaneous fat of the upper neck. 2.4 cm heterogeneous T2 hyperintense lesion involving the right posterior musculature at the upper back likely reflects an intramuscular hematoma (series 7, image 35). Mild prevertebral edema at the upper cervical  spine at the level of the dens fracture. Normal flow voids seen within the vertebral arteries bilaterally. Disc levels: C1-2: Mild age-related osteoarthritic changes with acute C2 and C1 fractures. Craniocervical junction remains widely patent. No evidence for impingement. C2-C3: Normal interspace. Mild facet hypertrophy. No canal or foraminal stenosis. C3-C4: 2 mm anterolisthesis. Minimal annular  disc bulge with disc desiccation. Moderate left with mild right facet degeneration. No spinal stenosis. Foramina remain patent. C4-C5: Minimal annular disc bulge with disc desiccation. Mild uncovertebral hypertrophy. No spinal stenosis. Foramina remain patent. C5-C6: Degenerative intervertebral disc space narrowing with diffuse disc osteophyte complex. Broad posterior component flattens the ventral thecal sac. Capacious dorsal CSF space. No significant spinal stenosis or cord impingement. Moderate right C6 foraminal narrowing. Left neural foramina remains patent. C6-C7: Degenerative intervertebral disc space narrowing with diffuse disc osteophyte complex. Flattening and partial effacement of the ventral thecal sac. Capacious dorsal CSF. No significant spinal stenosis or cord impingement. Foramina remain patent. C7-T1: Normal interspace. No spinal stenosis. Foramina remain patent. Postsurgical changes partially visualize within the upper thoracic spine. IMPRESSION: 1. Acute oblique minimally displaced type 3 dens fracture, stable in position and alignment from prior CT. Fractures extending through the posterior arch of C1 bilaterally as well as the C5 lamina and spinous process, stable in position and alignment from previous CT as well. 2. Mild edema involving the anterior longitudinal ligament at the level of the acute dens fracture, suggesting mild acute ligamentous injury/strain if not frank disruption. Edema within the inter spinous soft tissues extending from C2 through C5-6 also suspected to reflect a degree of mild  interspinous ligamentous strain/injury. No other convincing evidence for ligamentous injury within the cervical spine. 3. 2 mm anterolisthesis of C3 on C4, grossly similar as compared to previous MRI from 2019, and favored to be chronic and degenerative/facet mediated. No other traumatic subluxation within the cervical spine. 4. No evidence for traumatic cord injury. No epidural hematoma or other collection. 5. Mild soft tissue contusion/edema involving the posterior soft tissues of the neck with 2.4 cm intramuscular hematoma at the right upper posterior back as above. 6. Multilevel cervical spondylosis without significant spinal stenosis. Moderate right C6 foraminal narrowing related to disc osteophyte. No other significant foraminal encroachment. Electronically Signed   By: Jeannine Boga M.D.   On: 04/14/2020 18:55   DG Shoulder Left  Result Date: 04/14/2020 CLINICAL DATA:  Status post fall. EXAM: LEFT SHOULDER - 2+ VIEW COMPARISON:  None. FINDINGS: There is no evidence of an acute fracture or dislocation. There is no evidence of arthropathy or other focal bone abnormality. Extensive postoperative changes seen throughout the visualized portion of the thoracic spine. Soft tissues are unremarkable. IMPRESSION: No acute fracture or dislocation. Electronically Signed   By: Virgina Norfolk M.D.   On: 04/14/2020 16:14    Assessment/Plan: Mobilize today with physical therapy continue c-collar  LOS: 0 days     Elaina Hoops 04/15/2020, 7:50 AM

## 2020-04-15 NOTE — Progress Notes (Signed)
Initial Nutrition Assessment  DOCUMENTATION CODES:   Obesity unspecified  INTERVENTION:  Continue Ensure Enlive po BID, each supplement provides 350 kcal and 20 grams of protein  Encourage adequate PO intake.   NUTRITION DIAGNOSIS:   Increased nutrient needs related to acute illness as evidenced by estimated needs.  GOAL:   Patient will meet greater than or equal to 90% of their needs  MONITOR:   PO intake, Supplement acceptance, Skin, Weight trends, Labs, I & O's  REASON FOR ASSESSMENT:   Malnutrition Screening Tool    ASSESSMENT:   54 year old female with history of DM, neuropathy and foot drop in her left leg presents after fall. CT with odontoid fx, spinal process fx/facet fx  Pt unavailable during time of contact. RD unable to obtain pt nutrition history at this time. Pt in c-collar with cspine precaitions in place.Pt with no weight loss per weight records. Pt currently has Ensure ordered and has been consuming them. RD to continue with nutritional supplementation to aid in caloric and protein needs. Unable to complete Nutrition-Focused physical exam at this time.   Labs and medications reviewed.   Diet Order:   Diet Order            Diet Carb Modified Fluid consistency: Thin; Room service appropriate? Yes  Diet effective now                 EDUCATION NEEDS:   Not appropriate for education at this time  Skin:  Skin Assessment: Reviewed RN Assessment  Last BM:  12/6  Height:   Ht Readings from Last 1 Encounters:  04/14/20 5\' 3"  (1.6 m)    Weight:   Wt Readings from Last 1 Encounters:  04/14/20 85.7 kg   BMI:  Body mass index is 33.48 kg/m.  Estimated Nutritional Needs:   Kcal:  1800-1950  Protein:  85-100 grams  Fluid:  >/= 1.8 L/day  Corrin Parker, MS, RD, LDN RD pager number/after hours weekend pager number on Am[employer].

## 2020-04-15 NOTE — Evaluation (Signed)
Physical Therapy Evaluation Patient Details Name: Leah Franco MRN: [id number] DOB: Oct 08, 1965 Today's Date: 04/15/2020   History of Present Illness  54 yo female admitted to ED on 12/6 with fall. CT and MRI cervical spine reveal acute mildly displaced type III odontoid fracture, C1 bilateral posterior arch fracture, L L3-4 widening at facet joint, mild edema ALL at dens reveals strain vs disruption. No plan for surgical intervention at this time. 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  Clinical Impression   Pt presents with severe neck pain worse with mobility, decreased knowledge of cervical precautions, difficulty performing mobility tasks, and decreased activity tolerance vs baseline. Pt to benefit from acute PT to address deficits. Pt performed bed mobility and transfer to and from [facility] with min-mod assist, pt very limited by neck and tailbone pain from fall. PT recommending ST-SNF post-acutely to address deficits, vs HHPT if pt begins tolerating increased mobility. PT to progress mobility as tolerated, and will continue to follow acutely.      Follow Up Recommendations SNF;Other (comment) (vs HHPT pending pt progress)    Equipment Recommendations  Rolling walker with 5" wheels;3in1 (PT)    Recommendations for Other Services       Precautions / Restrictions Precautions Precautions: Fall;Cervical Precaution Booklet Issued: Yes (comment) Precaution Comments: log roll technique, BLT rules reviewed, pt limited in listening by pain Required Braces or Orthoses: Cervical Brace Cervical Brace: Hard collar;At all times Restrictions Weight Bearing Restrictions: No      Mobility  Bed Mobility Overal bed mobility: Needs Assistance Bed Mobility: Rolling;Sidelying to Sit;Sit to Sidelying Rolling: Min assist Sidelying to sit: Mod assist     Sit to sidelying: Mod assist General bed mobility comments: Min-mod  assist for log roll technique in and out of bed, most difficulty with lifting and lowering trunk from EOB.    Transfers Overall transfer level: Needs assistance Equipment used: 1 person hand held assist Transfers: Sit to/from [facility] Sit to Stand: Min assist Stand pivot transfers: Min assist       General transfer comment: Min assist for power up, steadying, and transfer to and from [facility]. Very increased time secondary to severe pain.  Ambulation/Gait             General Gait Details: NT - pt in too much pain  Stairs            Wheelchair Mobility    Modified Rankin (Stroke Patients Only)       Balance Overall balance assessment: Needs assistance;History of Falls Sitting-balance support: No upper extremity supported;Feet supported Sitting balance-[name] Scale: Fair Sitting balance - Comments: able to sit EOB without PT support   Standing balance support: Single extremity supported Standing balance-[name] Scale: Fair Standing balance comment: requires at least HHA at this time                             Pertinent Vitals/Pain Pain Assessment: 0-10 Pain Score: 10-Worst pain ever Pain Location: neck, with upright Pain Descriptors / Indicators: Aching;Sore;Grimacing Pain Intervention(s): Limited activity within patient's tolerance;Monitored during session;Repositioned;Patient requesting pain meds-RN notified    Home Living Family/patient expects to be discharged to:: Private residence Living Arrangements: Other relatives (sister) Available Help at Discharge: Family Type of Home: House Home Access: Stairs to enter   Technical brewer of Steps: 4 Home Layout: Two level;Bed/bath upstairs Home Equipment:  Walker - 2 wheels;Cane - single point;Bedside commode;Shower seat      Prior Function Level of Independence: Independent         Comments: reports multiple falls secondary to neuropathy     Hand Dominance    Dominant Hand: Right    Extremity/Trunk Assessment   Upper Extremity Assessment Upper Extremity Assessment: Defer to OT evaluation    Lower Extremity Assessment Lower Extremity Assessment: RLE deficits/detail;Generalized weakness;LLE deficits/detail RLE: Unable to fully assess due to pain RLE Sensation: history of peripheral neuropathy LLE: Unable to fully assess due to pain LLE Sensation: history of peripheral neuropathy    Cervical / Trunk Assessment Cervical / Trunk Assessment: Other exceptions Cervical / Trunk Exceptions: in C collar  Communication   Communication: No difficulties  Cognition Arousal/Alertness: Awake/alert Behavior During Therapy: Anxious Overall Cognitive Status: Within Functional Limits for tasks assessed                                 General Comments: Intermittent slurred speech, possibly due to pain medication vs high level of pain. Answers questions appropriately, anxious when up and moving secondary to pain and requires cues to slow breathing.      General Comments General comments (skin integrity, edema, etc.): DOE 2/4, SpO2 100% on RA, HR 80 bpm post-transfer back to bed    Exercises     Assessment/Plan    PT Assessment Patient needs continued PT services  PT Problem List Decreased strength;Decreased mobility;Decreased activity tolerance;Decreased balance;Decreased knowledge of use of DME;Pain;Decreased knowledge of precautions;Decreased safety awareness       PT Treatment Interventions DME instruction;Therapeutic activities;Gait training;Therapeutic exercise;Patient/family education;Balance training;Stair training;Functional mobility training;Neuromuscular re-education    PT Goals (Current goals can be found in the Care Plan section)  Acute Rehab PT Goals Patient Stated Goal: stop this pain PT Goal Formulation: With patient Time For Goal Achievement: 04/29/20 Potential to Achieve Goals: Good    Frequency Min 3X/week    Barriers to discharge        Co-evaluation               AM-PAC PT "6 Clicks" Mobility  Outcome Measure Help needed turning from your back to your side while in a flat bed without using bedrails?: A Little Help needed moving from lying on your back to sitting on the side of a flat bed without using bedrails?: A Lot Help needed moving to and from a bed to a chair (including a wheelchair)?: A Lot Help needed standing up from a chair using your arms (e.g., wheelchair or bedside chair)?: A Little Help needed to walk in hospital room?: A Lot Help needed climbing 3-5 steps with a railing? : A Lot 6 Click Score: 14    End of Session Equipment Utilized During Treatment: Cervical collar Activity Tolerance: Patient limited by pain Patient left: in bed;with call bell/phone within reach;with bed alarm set Nurse Communication: Mobility status;Patient requests pain meds PT Visit Diagnosis: Other abnormalities of gait and mobility (R26.89);Difficulty in walking, not elsewhere classified (R26.2);Pain Pain - Right/Left:  (mid) Pain - part of body:  (neck)    Time: 0086-7619 PT Time Calculation (min) (ACUTE ONLY): 32 min   Charges:   PT Evaluation $PT Eval Low Complexity: 1 Low PT Treatments $Therapeutic Activity: 8-22 mins       Olumide Dolinger E, PT Acute Rehabilitation Services Pager [phone]  Office [phone]   Marilouise Densmore D Kamarion Zagami 04/15/2020, 11:51 AM

## 2020-04-16 MED ORDER — CHLORHEXIDINE GLUCONATE CLOTH 2 % EX PADS
6.0000 | MEDICATED_PAD | Freq: Every day | CUTANEOUS | Status: DC
  Administered 2020-04-16 – 2020-04-21 (×6): 6 via TOPICAL

## 2020-04-16 NOTE — Evaluation (Signed)
Occupational Therapy Evaluation Patient Details Name: Leah Franco MRN: [id number] DOB: Jan 05, 1966 Today's Date: 04/16/2020    History of Present Illness 54 yo female admitted to ED on 12/6 with fall. CT and MRI cervical spine reveal acute mildly displaced type III odontoid fracture, C1 bilateral posterior arch fracture, L L3-4 widening at facet joint, mild edema ALL at dens reveals strain vs disruption. No plan for surgical intervention at this time. 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   Clinical Impression   Pt admitted with above. She demonstrates the below listed deficits and will benefit from continued OT to maximize safety and independence with BADLs.  Pt presents to OT with generalized weakness, impaired balance, increased pain.  She currently requires supervision/set up for UB ADLs and mod A for LB ADLs.  Min guard assist for functional mobility.  She reports she lives with her sister and nephew, who can assist as needed.  She reports she was fully independent PTA, but does have h/o falls.  Will follow.       Follow Up Recommendations  No OT follow up;Supervision/Assistance - 24 hour    Equipment Recommendations  None recommended by OT    Recommendations for Other Services       Precautions / Restrictions Precautions Precautions: Fall;Cervical Precaution Booklet Issued: Yes (comment) Precaution Comments: log roll technique, BLT rules reviewed, pt limited in listening by pain Required Braces or Orthoses: Cervical Brace Cervical Brace: Hard collar;At all times      Mobility Bed Mobility Overal bed mobility: Needs Assistance Bed Mobility: Sidelying to Sit   Sidelying to sit: Min assist       General bed mobility comments: assist to lift trunk     Transfers Overall transfer level: Needs assistance Equipment used: Rolling walker (2 wheeled) Transfers: Sit to/from [employer] Sit to Stand: Min guard Stand pivot transfers: Min guard       General transfer comment: min guard for safety     Balance Overall balance assessment: Needs assistance;History of Falls Sitting-balance support: No upper extremity supported;Feet supported Sitting balance-[name] Scale: Good     Standing balance support: No upper extremity supported;During functional activity Standing balance-[name] Scale: Fair Standing balance comment: min guard assist for unsupported standing while performing UB bathing                            ADL either performed or assessed with clinical judgement   ADL Overall ADL's : Needs assistance/impaired Eating/Feeding: Independent   Grooming: Wash/dry hands;Wash/dry face;Oral care;Min guard;Standing   Upper Body Bathing: Set up;Sitting   Lower Body Bathing: Sit to/from stand;Moderate assistance   Upper Body Dressing : Set up;Sitting   Lower Body Dressing: Sit to/from stand;Moderate assistance   Toilet Transfer: Min guard;Ambulation;Comfort height toilet;BSC;RW   Toileting- Clothing Manipulation and Hygiene: Minimal assistance;Sit to/from stand       Functional mobility during ADLs: Min guard;Rolling walker General ADL Comments: Pt requires assist for LEs      Vision         Perception     Praxis      Pertinent Vitals/Pain Pain Assessment: 0-10 Pain Score: 6  Pain Location: neck, with upright Pain Descriptors / Indicators: Aching;Sore;Grimacing Pain Intervention(s): Monitored during session     Hand Dominance Right   Extremity/Trunk Assessment Upper Extremity Assessment Upper Extremity Assessment: Generalized weakness  Lower Extremity Assessment Lower Extremity Assessment: Defer to PT evaluation   Cervical / Trunk Assessment Cervical / Trunk Assessment: Other exceptions Cervical / Trunk Exceptions: in C collar   Communication Communication Communication: No difficulties   Cognition  Arousal/Alertness: Awake/alert Behavior During Therapy: WFL for tasks assessed/performed Overall Cognitive Status: Within Functional Limits for tasks assessed                                     General Comments  Pt instructed how to don/doff collar, and how to change pads     Exercises     Shoulder Instructions      Home Living Family/patient expects to be discharged to:: Private residence Living Arrangements: Other relatives Available Help at Discharge: Family Type of Home: House Home Access: Stairs to enter Technical brewer of Steps: 4   Home Layout: Two level;Bed/bath upstairs     Bathroom Shower/Tub: Tub/shower unit;Walk-in shower   Bathroom Toilet: Standard     Home Equipment: Environmental consultant - 2 wheels;Cane - single point;Bedside commode;Shower seat;Adaptive equipment Adaptive Equipment: Reacher;Sock aid;Long-handled shoe horn;Long-handled sponge Additional Comments: Pt lives with her sister and nephew.  Pt reports sister works from home       Prior Functioning/Environment Level of Independence: Independent        Comments: reports multiple falls secondary to neuropathy.  Pt works full time remotely as a Building services engineer Problem List: Decreased strength;Decreased activity tolerance;Impaired balance (sitting and/or standing);Decreased safety awareness;Decreased knowledge of use of DME or AE;Decreased knowledge of precautions;Pain      OT Treatment/Interventions: Self-care/ADL training;DME and/or AE instruction;Therapeutic activities;Patient/family education;Balance training    OT Goals(Current goals can be found in the care plan section) Acute Rehab OT Goals Patient Stated Goal: to be able to take care of self and return to work  OT Goal Formulation: With patient Time For Goal Achievement: 04/30/20 Potential to Achieve Goals: Good ADL Goals Pt Will Perform Grooming: with supervision;standing Pt Will Perform Lower Body Bathing: with  supervision;sit to/from stand;with adaptive equipment Pt Will Perform Lower Body Dressing: with supervision;with adaptive equipment;sit to/from stand Pt Will Transfer to Toilet: with supervision;ambulating;regular height toilet;bedside commode;grab bars Pt Will Perform Toileting - Clothing Manipulation and hygiene: with supervision;sit to/from stand;with adaptive equipment Pt Will Perform Tub/Shower Transfer: Tub transfer;with min assist;ambulating;shower seat;rolling walker Additional ADL Goal #1: Pt/caregiver will be independent with donning/doffing cervical collar and changing pads  OT Frequency: Min 2X/week   Barriers to D/C:            Co-evaluation              AM-PAC OT "6 Clicks" Daily Activity     Outcome Measure Help from another person eating meals?: None Help from another person taking care of personal grooming?: A Little Help from another person toileting, which includes using toliet, bedpan, or urinal?: A Little Help from another person bathing (including washing, rinsing, drying)?: A Lot Help from another person to put on and taking off regular upper body clothing?: A Little Help from another person to put on and taking off regular lower body clothing?: A Little 6 Click Score: 18   End of Session Equipment Utilized During Treatment: Gait belt;Rolling walker;Cervical collar Nurse Communication: Mobility status  Activity Tolerance: Patient tolerated treatment well Patient left: in bed;with call bell/phone within reach  OT Visit Diagnosis: Unsteadiness on feet (R26.81);Pain Pain -  part of body:  (head,neck, and coccyx )                Time: 0698-6148 OT Time Calculation (min): 41 min Charges:  OT General Charges $OT Visit: 1 Visit OT Evaluation $OT Eval Moderate Complexity: 1 Mod OT Treatments $Self Care/Home Management : 23-37 mins  Nilsa Nutting., OTR/L Acute Rehabilitation Services Pager [phone] Office [place], [place] 04/16/2020,  1:49 PM

## 2020-04-16 NOTE — Progress Notes (Signed)
   04/15/20 2316  Assess: MEWS Score  Temp 98.1 F (36.7 C)  BP 97/70  Pulse Rate (!) 107  Resp 20  Level of Consciousness Alert  SpO2 100 %  O2 Device Room Air  Assess: MEWS Score  MEWS Temp 0  MEWS Systolic 1  MEWS Pulse 1  MEWS RR 0  MEWS LOC 0  MEWS Score 2  MEWS Score Color Yellow  Assess: if the MEWS score is Yellow or Red  Were vital signs taken at a resting state? Yes  Focused Assessment No change from prior assessment  Early Detection of Sepsis Score *See Row Information* Low  MEWS guidelines implemented *See Row Information* Yes  Treat  MEWS Interventions Other (Comment) (no treatment)  Take Vital Signs  Increase Vital Sign Frequency  Yellow: Q 2hr X 2 then Q 4hr X 2, if remains yellow, continue Q 4hrs  Escalate  MEWS: Escalate Yellow: discuss with charge nurse/RN and consider discussing with provider and RRT  Notify: Charge Nurse/RN  Name of Charge Nurse/RN Notified Cheron Schaumann, RN  Date Charge Nurse/RN Notified 04/15/20  Time Charge Nurse/RN Notified 2325  Document  Patient Outcome Other (Comment) (no intervention needed)

## 2020-04-16 NOTE — NC FL2 (Signed)
[place] MEDICAID FL2 LEVEL OF CARE SCREENING TOOL     IDENTIFICATION  Patient Name: Leah Franco Birthdate: 12/03/1965 Sex: female Admission Date (Current Location): 04/14/2020  [facility] and [place] Number:  Herbalist and Address:  [facility], [place] [address] [place], [place] [address]      Provider Number: [id number]  Attending Physician Name and Address:  Kary Kos, MD  Relative Name and Phone Number:       Current Level of Care: Hospital Recommended Level of Care: [place] Prior Approval Number:    Date Approved/Denied:   PASRR Number: [id number]  Discharge Plan: SNF    Current Diagnoses: Patient Active Problem List   Diagnosis Date Noted  . Cervical spine fracture ([place]) 04/14/2020  . GIB (gastrointestinal bleeding) 03/12/2020  . Arterial insufficiency ([place]) 11/25/2010  . MRSA (methicillin resistant Staphylococcus aureus) 11/25/2010  . COPD (chronic obstructive pulmonary disease) ([place]) 11/25/2010  . CHF (congestive heart failure) ([place]) 11/25/2010  . Spinal cord injury 11/25/2010  . CAD (coronary artery disease) 11/25/2010  . CVA (cerebral infarction) 11/25/2010  . TIA (transient ischemic attack) 11/25/2010  . Diabetes mellitus 11/25/2010  . Neuropathy 11/25/2010  . End stage renal disease ([place]) 11/25/2010  . Dialysis care 11/25/2010  . Lupus ([place]) 11/25/2010  . Lymphedema 11/25/2010  . Scoliosis 11/25/2010  . Abscess 11/25/2010    Orientation RESPIRATION BLADDER Height & Weight     Self, Time, Situation, Place  Normal External catheter, Continent Weight: 189 lb (85.7 kg) Height:  5\' 3"  (160 cm)  BEHAVIORAL SYMPTOMS/MOOD NEUROLOGICAL BOWEL NUTRITION STATUS      Continent Diet (see discharge summary)  AMBULATORY STATUS COMMUNICATION OF NEEDS Skin     Verbally Skin abrasions (abrasion head,leg, anterior medial RT eye/shoulder)                       Personal Care Assistance Level of Assistance   Bathing, Dressing, Feeding Bathing Assistance: Limited assistance Feeding assistance: Independent Dressing Assistance: Limited assistance     Functional Limitations Info  Sight, Hearing, Speech Sight Info: Adequate Hearing Info: Adequate Speech Info: Adequate    SPECIAL CARE FACTORS FREQUENCY  PT (By licensed PT)     PT Frequency: 5x per week              Contractures Contractures Info: Not present    Additional Factors Info  Code Status, Allergies Code Status Info: Full Allergies Info: lactose, tape           Current Medications (04/16/2020):  This is the current hospital active medication list Current Facility-Administered Medications  Medication Dose Route Frequency Provider Last Rate Last Admin  . acetaminophen (TYLENOL) tablet 650 mg  650 mg Oral Q6H PRN Eleonore Chiquito, NP   650 mg at 04/16/20 0857   Or  . acetaminophen (TYLENOL) suppository 650 mg  650 mg Rectal Q6H PRN Meyran, Ocie Cornfield, NP      . diphenhydrAMINE (BENADRYL) capsule 50 mg  50 mg Oral QHS Kary Kos, MD   50 mg at 04/15/20 2150   And  . acetaminophen (TYLENOL) tablet 1,000 mg  1,000 mg Oral QHS Kary Kos, MD   1,000 mg at 04/15/20 2150  . atorvastatin (LIPITOR) tablet 10 mg  10 mg Oral QPM Meyran, Ocie Cornfield, NP   10 mg at 04/15/20 1737  . cyclobenzaprine (FLEXERIL) tablet 5 mg  5 mg Oral TID PRN Meyran, Ocie Cornfield, NP  5 mg at 04/16/20 0327  . docusate sodium (COLACE) capsule 100 mg  100 mg Oral BID Eleonore Chiquito, NP   100 mg at 04/16/20 0844  . DULoxetine (CYMBALTA) DR capsule 60 mg  60 mg Oral QPM Meyran, Ocie Cornfield, NP   60 mg at 04/15/20 1737  . etanercept (ENBREL) 50 MG/ML injection 50 mg  50 mg Subcutaneous Q Tue Meyran, Ocie Cornfield, NP      . feeding supplement (ENSURE ENLIVE / ENSURE PLUS) liquid 237 mL  237 mL Oral BID BM Kary Kos, MD   237 mL at 04/16/20 1417  . gabapentin (NEURONTIN) capsule 600 mg  600 mg Oral TID Eleonore Chiquito, NP   600 mg at 04/16/20 1113  . gabapentin (NEURONTIN) capsule 800 mg  800 mg Oral QHS Meyran, Ocie Cornfield, NP   800 mg at 04/15/20 2150  . HYDROcodone-acetaminophen (NORCO/VICODIN) 5-325 MG per tablet 1-2 tablet  1-2 tablet Oral Q4H PRN Meyran, Ocie Cornfield, NP   2 tablet at 04/16/20 1113  . HYDROmorphone (DILAUDID) injection 0.5-1 mg  0.5-1 mg Intravenous Q2H PRN Meyran, Ocie Cornfield, NP   1 mg at 04/16/20 0837  . metFORMIN (GLUCOPHAGE) tablet 1,000 mg  1,000 mg Oral QHS Kary Kos, MD   1,000 mg at 04/15/20 2149  . metFORMIN (GLUCOPHAGE) tablet 500 mg  500 mg Oral Q breakfast Kary Kos, MD   500 mg at 04/16/20 0844  . metoprolol tartrate (LOPRESSOR) tablet 25 mg  25 mg Oral BID Eleonore Chiquito, NP   25 mg at 04/14/20 2320  . ondansetron (ZOFRAN) tablet 4 mg  4 mg Oral Q6H PRN Meyran, Ocie Cornfield, NP       Or  . ondansetron (ZOFRAN) injection 4 mg  4 mg Intravenous Q6H PRN Meyran, Ocie Cornfield, NP   4 mg at 04/16/20 9150  . pantoprazole (PROTONIX) EC tablet 40 mg  40 mg Oral BID Eleonore Chiquito, NP   40 mg at 04/16/20 0858  . phenol (CHLORASEPTIC) mouth spray 1 spray  1 spray Mouth/Throat PRN Kary Kos, MD   1 spray at 04/16/20 0113  . Semaglutide(0.25 or 0.5MG /DOS) SOPN 0.5 mg  0.5 mg Subcutaneous Q Tue Meyran, Ocie Cornfield, NP      . senna (SENOKOT) tablet 8.6 mg  1 tablet Oral BID Eleonore Chiquito, NP   8.6 mg at 04/16/20 0844  . sucralfate (CARAFATE) tablet 1 g  1 g Oral QID Meyran, Ocie Cornfield, NP   1 g at 04/16/20 1417  . Vitamin D (Ergocalciferol) (DRISDOL) capsule 50,000 Units  50,000 Units Oral Once per day on Mon Wed Fri Sat Meyran, Ocie Cornfield, NP         Discharge Medications: Please see discharge summary for a list of discharge medications.  Relevant Imaging Results:  Relevant Lab Results:   Additional Information SSN [id number]  Vinie Sill, [place]

## 2020-04-16 NOTE — TOC Initial Note (Signed)
Transition of Care [facility]) - Initial/Assessment Note    Patient Details  Name: Leah Franco MRN: [id number] Date of Birth: [birth year]  Transition of [facility] CM/SW Contact:    Vinie Sill, [place] Phone Number: 04/16/2020, 2:37 PM  Clinical Narrative:                  CSW visit patient at bedside. CSW introduced self and explained role. CSW discussed with patient PT recommendation of short term rehab/pending progress at SNF before discharging home. Patient states she lives in the home with her sister and nephew. She and her sister is a little nervous about returning home before rehab. She explained she is concerned about using the stairs. CSW explain will monitor to see how she progress with PT. CSW explained the SNF process. Patient gave CSW permission to send SNF referrals as back up plan.   CSW will continue to follow and assist with discharge planning. CSW will provide bed offers once available, if SNF remains the recommendation.  Thurmond Butts, MSW, [place] Clinical Social Worker    Expected Discharge Plan: [name]     Patient Goals and CMS Choice        Expected Discharge Plan and Services Expected Discharge Plan: [place] In-house Referral: Clinical Social Work                                            Prior Living Arrangements/Services   Lives with:: Self Patient language and need for interpreter reviewed:: No        Need for Family Participation in Patient Care: Yes (Comment) Care giver support system in place?: Yes (comment)   Criminal Activity/Legal Involvement Pertinent to Current Situation/Hospitalization: No - Comment as needed  Activities of Daily Living Home Assistive Devices/Equipment: Cane (specify quad or straight), CBG Meter, Eyeglasses, Shower chair with back, Environmental consultant (specify type) ADL Screening (condition at time of admission) Patient's cognitive ability adequate to safely complete daily  activities?: Yes Is the patient deaf or have difficulty hearing?: Yes Does the patient have difficulty seeing, even when wearing glasses/contacts?: Yes Does the patient have difficulty concentrating, remembering, or making decisions?: No Patient able to express need for assistance with ADLs?: Yes Does the patient have difficulty dressing or bathing?: Yes Independently performs ADLs?: Yes (appropriate for developmental age) Does the patient have difficulty walking or climbing stairs?: Yes Weakness of Legs: Left Weakness of Arms/Hands: Left  Permission Sought/Granted Permission sought to share information with : Family Supports Permission granted to share information with : Yes, Verbal Permission Granted  Share Information with NAME: Assunta Found  Permission granted to share info w AGENCY: SNFs  Permission granted to share info w Relationship: sister  Permission granted to share info w Contact Information: [phone]  Emotional Assessment Appearance:: Appears stated age Attitude/Demeanor/Rapport: Engaged Affect (typically observed): Accepting, Appropriate Orientation: : Oriented to Self, Oriented to Place, Oriented to  Time, Oriented to Situation Alcohol / Substance Use: Not Applicable Psych Involvement: No (comment)  Admission diagnosis:  Cervical spine fracture ([place]) [S12.9XXA] Fall from slip, trip, or stumble, initial encounter [W01.0XXA] Fall, initial encounter [id number]] Laceration of forehead, initial encounter [S01.81XA] Closed fracture of spinous process of cervical vertebra, initial encounter ([place]) [S12.9XXA] Closed nondisplaced fracture of first cervical vertebra, unspecified fracture morphology, initial encounter ([place]) [S12.001A] Closed displaced fracture of fifth cervical vertebra, unspecified fracture morphology, initial  encounter [facility]) [P39.688A] Closed odontoid fracture with type III morphology, initial encounter [facility]) [S12.120A] Patient Active Problem List    Diagnosis Date Noted  . Cervical spine fracture ([place]) 04/14/2020  . GIB (gastrointestinal bleeding) 03/12/2020  . Arterial insufficiency ([place]) 11/25/2010  . MRSA (methicillin resistant Staphylococcus aureus) 11/25/2010  . COPD (chronic obstructive pulmonary disease) ([place]) 11/25/2010  . CHF (congestive heart failure) ([place]) 11/25/2010  . Spinal cord injury 11/25/2010  . CAD (coronary artery disease) 11/25/2010  . CVA (cerebral infarction) 11/25/2010  . TIA (transient ischemic attack) 11/25/2010  . Diabetes mellitus 11/25/2010  . Neuropathy 11/25/2010  . End stage renal disease ([place]) 11/25/2010  . Dialysis care 11/25/2010  . Lupus ([place]) 11/25/2010  . Lymphedema 11/25/2010  . Scoliosis 11/25/2010  . Abscess 11/25/2010   PCP:  [name], MD Pharmacy:   [facility] [place], [place] [address] Phone: [phone] Fax: [phone]  [id number] - [place], [place] - [address] AT [employer] [address] [place] [address] Phone: [phone] Fax: [phone]     Social Determinants of Health (SDOH) Interventions    Readmission Risk Interventions No flowsheet data found.

## 2020-04-16 NOTE — Progress Notes (Signed)
Subjective: Patient reports Getting better neck pain getting better however cervical collar is digging into the skin around her clavicle  Objective: Vital signs in last 24 hours: Temp:  [98 F (36.7 C)-98.3 F (36.8 C)] 98.3 F (36.8 C) (12/08 0322) Pulse Rate:  [95-116] 105 (12/08 0322) Resp:  [18-20] 19 (12/08 0322) BP: (97-122)/(70-87) 117/86 (12/08 0322) SpO2:  [92 %-100 %] 94 % (12/08 0106)  Intake/Output from previous day: 12/07 0701 - 12/08 0700 In: 1000 [P.O.:1000] Out: 6523 [RCVEL:3810] Intake/Output this shift: No intake/output data recorded.  Awake alert strength 5 of 5 c-collar looks like it fits her however there is a sharp edge of a plastic that sticking in the skin around her collarbone clearly the style of collar is not going to work  Lab Results: Recent Labs    04/14/20 2013  WBC 9.7  HGB 11.2*  HCT 37.1  PLT 432*   BMET Recent Labs    04/14/20 2013  NA 139  K 3.7  CL 102  CO2 25  GLUCOSE 115*  BUN 15  CREATININE 0.51  CALCIUM 8.1*    Studies/Results: DG Thoracic Spine 2 View  Result Date: 04/14/2020 CLINICAL DATA:  Status post fall. EXAM: THORACIC SPINE 2 VIEWS COMPARISON:  March 17, 2020 FINDINGS: There is no evidence of an acute thoracic spine fracture. A chronic compression fracture deformity is seen involving the L2 vertebral body. Multiple bilateral radiopaque pedicle screws are noted throughout the thoracolumbar spine. Stable moderate to marked severity S-shaped scoliosis of the thoracolumbar spine is present. No other significant bone abnormalities are identified. IMPRESSION: 1. Stable chronic compression fracture deformity of the L2 vertebral body. 2. Extensive postoperative changes within the thoracolumbar spine with a stable moderate to marked severity S-shaped scoliosis. Electronically Signed   By: Virgina Norfolk M.D.   On: 04/14/2020 16:13   DG Shoulder Right  Result Date: 04/14/2020 CLINICAL DATA:  Status post fall. EXAM: RIGHT  SHOULDER - 2+ VIEW COMPARISON:  None. FINDINGS: There is no evidence of an acute fracture or dislocation. There is no evidence of arthropathy. A chronic seventh right rib fracture is seen. Extensive postoperative changes are seen throughout the visualized portion of the thoracic spine. Soft tissues are unremarkable. IMPRESSION: No acute fracture or dislocation. Electronically Signed   By: Virgina Norfolk M.D.   On: 04/14/2020 16:19   CT HEAD WO CONTRAST  Result Date: 04/14/2020 CLINICAL DATA:  Head trauma, moderate/severe. Polytrauma, critical, head/cervical spine injury suspected. Additional history provided: Fall. EXAM: CT HEAD WITHOUT CONTRAST CT CERVICAL SPINE WITHOUT CONTRAST TECHNIQUE: Multidetector CT imaging of the head and cervical spine was performed following the standard protocol without intravenous contrast. Multiplanar CT image reconstructions of the cervical spine were also generated. COMPARISON:  Head CT 04/08/2020. cervical spine MRI 08/01/2017. FINDINGS: CT HEAD FINDINGS Brain: Mild cerebral and cerebellar atrophy. There is no acute intracranial hemorrhage. No demarcated cortical infarct. No extra-axial fluid collection. No evidence of intracranial mass. No midline shift. Vascular: No hyperdense vessel.  Atherosclerotic calcifications. Skull: Nondepressed left occipital calvarial fracture. Sinuses/Orbits: Visualized orbits show no acute finding. Trace ethmoid sinus mucosal thickening. Other: Very large biparietal scalp hematoma. Associated gas within the scalp at this site suggestive of laceration CT CERVICAL SPINE FINDINGS Alignment: Reversal of the expected cervical lordosis. 3 mm C3-C4 grade 1 anterolisthesis. Subtle widening of the left C3-C4 facet joint (series 8, image 48). Skull base and vertebrae: The basion-dental and atlanto-dental intervals are maintained. Acute minimally displaced fractures of the C1 posterior arch  bilaterally. Acute type 3 odontoid fracture. 2 mm anterior  displacement of the fracture fragment. Acute, minimally displaced fracture of the left C5 lamina extending into the spinous process. Fractures of the C6 and C7 spinous processes appear chronic. Partially visualized posterior spinal fusion construct extending into the thoracic spine inferiorly from the T2 level. Soft tissues and spinal canal: No prevertebral fluid or swelling. No visible canal hematoma. Soft tissue stranding within the posterior neck, likely posttraumatic. Disc levels: Cervical spondylosis. Most notably at C5-C6 and C6-C7, there is advanced disc degeneration with posterior disc osteophytes and uncovertebral hypertrophy. Upper chest: No consolidation within the imaged lung apices. No visible pneumothorax. These results were called by telephone at the time of interpretation on 04/14/2020 at 4:50 pm to provider Dr. Ashok Cordia, who verbally acknowledged these results. IMPRESSION: CT head: 1. No evidence of acute intracranial abnormality. 2. Nondepressed left occipital calvarial fracture. 3. Very large biparietal scalp hematoma. Associated gas within the scalp at this site suggestive of laceration. 4. Mild atrophy of the brain. CT cervical spine: 1. Acute, mildly displaced type 3 odontoid fracture. 2. Acute minimally displaced fractures of the C1 posterior arch bilaterally. 3. Acute, mildly displaced fracture of the left C5 lamina extending into the spinous process. 4. Subtle widening of the left C3-C4 facet joint with 3 mm grade 1 anterolisthesis at this level. Cervical spine MRI is recommended to assess for ligamentous injury. 5. Fractures of the C6 and C7 spinous processes appear chronic. 6. Nonspecific reversal of the expected cervical lordosis. 7. Cervical spondylosis as described and greatest at C5-C6 and C6-C7. Electronically Signed   By: Kellie Simmering DO   On: 04/14/2020 16:51   CT CERVICAL SPINE WO CONTRAST  Result Date: 04/14/2020 CLINICAL DATA:  Head trauma, moderate/severe. Polytrauma, critical,  head/cervical spine injury suspected. Additional history provided: Fall. EXAM: CT HEAD WITHOUT CONTRAST CT CERVICAL SPINE WITHOUT CONTRAST TECHNIQUE: Multidetector CT imaging of the head and cervical spine was performed following the standard protocol without intravenous contrast. Multiplanar CT image reconstructions of the cervical spine were also generated. COMPARISON:  Head CT 04/08/2020. cervical spine MRI 08/01/2017. FINDINGS: CT HEAD FINDINGS Brain: Mild cerebral and cerebellar atrophy. There is no acute intracranial hemorrhage. No demarcated cortical infarct. No extra-axial fluid collection. No evidence of intracranial mass. No midline shift. Vascular: No hyperdense vessel.  Atherosclerotic calcifications. Skull: Nondepressed left occipital calvarial fracture. Sinuses/Orbits: Visualized orbits show no acute finding. Trace ethmoid sinus mucosal thickening. Other: Very large biparietal scalp hematoma. Associated gas within the scalp at this site suggestive of laceration CT CERVICAL SPINE FINDINGS Alignment: Reversal of the expected cervical lordosis. 3 mm C3-C4 grade 1 anterolisthesis. Subtle widening of the left C3-C4 facet joint (series 8, image 48). Skull base and vertebrae: The basion-dental and atlanto-dental intervals are maintained. Acute minimally displaced fractures of the C1 posterior arch bilaterally. Acute type 3 odontoid fracture. 2 mm anterior displacement of the fracture fragment. Acute, minimally displaced fracture of the left C5 lamina extending into the spinous process. Fractures of the C6 and C7 spinous processes appear chronic. Partially visualized posterior spinal fusion construct extending into the thoracic spine inferiorly from the T2 level. Soft tissues and spinal canal: No prevertebral fluid or swelling. No visible canal hematoma. Soft tissue stranding within the posterior neck, likely posttraumatic. Disc levels: Cervical spondylosis. Most notably at C5-C6 and C6-C7, there is advanced  disc degeneration with posterior disc osteophytes and uncovertebral hypertrophy. Upper chest: No consolidation within the imaged lung apices. No visible pneumothorax. These results were  called by telephone at the time of interpretation on 04/14/2020 at 4:50 pm to provider Dr. Ashok Cordia, who verbally acknowledged these results. IMPRESSION: CT head: 1. No evidence of acute intracranial abnormality. 2. Nondepressed left occipital calvarial fracture. 3. Very large biparietal scalp hematoma. Associated gas within the scalp at this site suggestive of laceration. 4. Mild atrophy of the brain. CT cervical spine: 1. Acute, mildly displaced type 3 odontoid fracture. 2. Acute minimally displaced fractures of the C1 posterior arch bilaterally. 3. Acute, mildly displaced fracture of the left C5 lamina extending into the spinous process. 4. Subtle widening of the left C3-C4 facet joint with 3 mm grade 1 anterolisthesis at this level. Cervical spine MRI is recommended to assess for ligamentous injury. 5. Fractures of the C6 and C7 spinous processes appear chronic. 6. Nonspecific reversal of the expected cervical lordosis. 7. Cervical spondylosis as described and greatest at C5-C6 and C6-C7. Electronically Signed   By: Kellie Simmering DO   On: 04/14/2020 16:51   MR Cervical Spine Wo Contrast  Result Date: 04/14/2020 CLINICAL DATA:  Initial evaluation for acute neck trauma, cervical spine fractures, evaluate for ligamentous injury. EXAM: MRI CERVICAL SPINE WITHOUT CONTRAST TECHNIQUE: Multiplanar, multisequence MR imaging of the cervical spine was performed. No intravenous contrast was administered. COMPARISON:  Prior CT from earlier the same day as well as previous MRI from 08/01/2017. FINDINGS: Alignment: Straightening with smooth reversal of the normal cervical lordosis. Trace 2 mm anterolisthesis of C3 on C4, stable from prior CT as well as previous MRI from 2019, therefore or favored to be chronic in facet mediated. Trace  anterolisthesis of T1 on T2 also favored to be chronic and facet mediated. No other listhesis or traumatic static subluxation. Vertebrae: Previously identified oblique minimally displaced type 3 dens fracture again seen, stable in position and alignment from prior. Acute nondisplaced fractures extending through the posterior arch of C1 bilaterally grossly stable as well, better seen on prior CT. Fractures involving the C5 lamina and spinous process noted, stable in position and alignment, also seen on prior CT. No other acute fracture evident by MRI. Chronic fractures of the C6 and C7 spinous processes again noted. Vertebral body height maintained with no other vertebral body fracture identified. Underlying bone marrow signal intensity within normal limits. No discrete or worrisome osseous lesions. Susceptibility artifact related to posterior fusion extending from T2 through the visualized upper thoracic spine partially visualized. Cord: Normal signal and morphology. No evidence for traumatic cord injury or hemorrhagic contusion. No significant epidural hematoma or other collection. Mild edema seen involving the anterior longitudinal ligament at the level of the acute dens fracture, suggesting ligamentous injury if not frank disruption (series 4, image 6). Posterior longitudinal ligament and ligamentum flavum appear grossly intact. Tectorial membrane appears intact. Mild edema seen within the interspinous soft tissues extending from C2 through C5-6, which could reflect a degree of mild ligamentous strain/injury involving the inter spinous ligaments (series 7, image 4). No other convincing evidence for ligamentous injury. Posterior Fossa, vertebral arteries, paraspinal tissues: Chronic microvascular ischemic disease noted within the pons and brainstem. Visualized portions of the brain demonstrate no other acute finding. Mild soft tissue edema/contusion noted within the posterior subcutaneous fat of the upper neck.  2.4 cm heterogeneous T2 hyperintense lesion involving the right posterior musculature at the upper back likely reflects an intramuscular hematoma (series 7, image 35). Mild prevertebral edema at the upper cervical spine at the level of the dens fracture. Normal flow voids seen within the vertebral  arteries bilaterally. Disc levels: C1-2: Mild age-related osteoarthritic changes with acute C2 and C1 fractures. Craniocervical junction remains widely patent. No evidence for impingement. C2-C3: Normal interspace. Mild facet hypertrophy. No canal or foraminal stenosis. C3-C4: 2 mm anterolisthesis. Minimal annular disc bulge with disc desiccation. Moderate left with mild right facet degeneration. No spinal stenosis. Foramina remain patent. C4-C5: Minimal annular disc bulge with disc desiccation. Mild uncovertebral hypertrophy. No spinal stenosis. Foramina remain patent. C5-C6: Degenerative intervertebral disc space narrowing with diffuse disc osteophyte complex. Broad posterior component flattens the ventral thecal sac. Capacious dorsal CSF space. No significant spinal stenosis or cord impingement. Moderate right C6 foraminal narrowing. Left neural foramina remains patent. C6-C7: Degenerative intervertebral disc space narrowing with diffuse disc osteophyte complex. Flattening and partial effacement of the ventral thecal sac. Capacious dorsal CSF. No significant spinal stenosis or cord impingement. Foramina remain patent. C7-T1: Normal interspace. No spinal stenosis. Foramina remain patent. Postsurgical changes partially visualize within the upper thoracic spine. IMPRESSION: 1. Acute oblique minimally displaced type 3 dens fracture, stable in position and alignment from prior CT. Fractures extending through the posterior arch of C1 bilaterally as well as the C5 lamina and spinous process, stable in position and alignment from previous CT as well. 2. Mild edema involving the anterior longitudinal ligament at the level of the  acute dens fracture, suggesting mild acute ligamentous injury/strain if not frank disruption. Edema within the inter spinous soft tissues extending from C2 through C5-6 also suspected to reflect a degree of mild interspinous ligamentous strain/injury. No other convincing evidence for ligamentous injury within the cervical spine. 3. 2 mm anterolisthesis of C3 on C4, grossly similar as compared to previous MRI from 2019, and favored to be chronic and degenerative/facet mediated. No other traumatic subluxation within the cervical spine. 4. No evidence for traumatic cord injury. No epidural hematoma or other collection. 5. Mild soft tissue contusion/edema involving the posterior soft tissues of the neck with 2.4 cm intramuscular hematoma at the right upper posterior back as above. 6. Multilevel cervical spondylosis without significant spinal stenosis. Moderate right C6 foraminal narrowing related to disc osteophyte. No other significant foraminal encroachment. Electronically Signed   By: Jeannine Boga M.D.   On: 04/14/2020 18:55   DG Shoulder Left  Result Date: 04/14/2020 CLINICAL DATA:  Status post fall. EXAM: LEFT SHOULDER - 2+ VIEW COMPARISON:  None. FINDINGS: There is no evidence of an acute fracture or dislocation. There is no evidence of arthropathy or other focal bone abnormality. Extensive postoperative changes seen throughout the visualized portion of the thoracic spine. Soft tissues are unremarkable. IMPRESSION: No acute fracture or dislocation. Electronically Signed   By: Virgina Norfolk M.D.   On: 04/14/2020 16:14    Assessment/Plan: 54 year old female status post cervical spine fracture following fall attempting to manage conservatively in a cervical collar however current collar is not acceptable.  I have ordered an Aspen collar if they are unable to provide this or get a collar that immobilized her neck but yet does not breakdown the skin around it we will have to consider standing from  outside the hospital  LOS: 1 day     Elaina Hoops 04/16/2020, 8:02 AM

## 2020-04-16 NOTE — Progress Notes (Signed)
   04/16/20 1939  Assess: MEWS Score  Temp 98.5 F (36.9 C)  BP 109/80  Pulse Rate (!) 124  Resp 18  SpO2 92 %  Assess: MEWS Score  MEWS Temp 0  MEWS Systolic 0  MEWS Pulse 2  MEWS RR 0  MEWS LOC 0  MEWS Score 2  MEWS Score Color Yellow  Assess: if the MEWS score is Yellow or Red  Were vital signs taken at a resting state? Yes  Focused Assessment No change from prior assessment  Early Detection of Sepsis Score *See Row Information* Low  MEWS guidelines implemented *See Row Information* Yes  Take Vital Signs  Increase Vital Sign Frequency  Yellow: Q 2hr X 2 then Q 4hr X 2, if remains yellow, continue Q 4hrs  Escalate  MEWS: Escalate Yellow: discuss with charge nurse/RN and consider discussing with provider and RRT  Notify: Charge Nurse/RN  Name of Charge Nurse/RN Notified Cheron Schaumann, RN  Date Charge Nurse/RN Notified 04/16/20  Time Charge Nurse/RN Notified 2104  Document  Patient Outcome Other (Comment) (no intervention given)  Progress note created (see row info) Yes

## 2020-04-17 ENCOUNTER — Inpatient Hospital Stay (HOSPITAL_COMMUNITY): Payer: PRIVATE HEALTH INSURANCE

## 2020-04-17 MED ORDER — BETHANECHOL CHLORIDE 25 MG PO TABS
25.0000 mg | ORAL_TABLET | Freq: Three times a day (TID) | ORAL | Status: DC
  Administered 2020-04-17 – 2020-04-28 (×30): 25 mg via ORAL
  Filled 2020-04-17 (×30): qty 1

## 2020-04-17 MED ORDER — SEMAGLUTIDE(0.25 OR 0.5MG/DOS) 2 MG/1.5ML ~~LOC~~ SOPN
0.5000 mg | PEN_INJECTOR | SUBCUTANEOUS | Status: DC
  Administered 2020-04-17 – 2020-05-01 (×3): 0.5 mg via SUBCUTANEOUS
  Filled 2020-04-17 (×3): qty 0.38

## 2020-04-17 MED ORDER — SUCRALFATE 1 GM/10ML PO SUSP
1.0000 g | Freq: Three times a day (TID) | ORAL | Status: DC
  Administered 2020-04-17 – 2020-05-05 (×65): 1 g via ORAL
  Filled 2020-04-17 (×75): qty 10

## 2020-04-17 MED ORDER — ETANERCEPT 50 MG/ML ~~LOC~~ SOSY
50.0000 mg | PREFILLED_SYRINGE | SUBCUTANEOUS | Status: DC
  Administered 2020-04-17 – 2020-05-01 (×2): 50 mg via SUBCUTANEOUS
  Filled 2020-04-17 (×3): qty 0.98

## 2020-04-17 NOTE — Progress Notes (Signed)
Subjective: Patient reports Slightly more neck pain this morning though no radicular symptoms new collar exposed arrived around 9 AM  Objective: Vital signs in last 24 hours: Temp:  [98 F (36.7 C)-98.5 F (36.9 C)] 98 F (36.7 C) (12/09 0421) Pulse Rate:  [95-124] 95 (12/09 0421) Resp:  [16-19] 18 (12/09 0421) BP: (109-132)/(80-99) 130/88 (12/09 0421) SpO2:  [92 %-98 %] 98 % (12/09 0421)  Intake/Output from previous day: 12/08 0701 - 12/09 0700 In: 480 [P.O.:480] Out: 2900 [Urine:2900] Intake/Output this shift: No intake/output data recorded.  Neurologically intact strength 5 out of 5 cervical collar in good position however still not comfortable and not sustainable for the patient.  Lab Results: Recent Labs    04/14/20 2013  WBC 9.7  HGB 11.2*  HCT 37.1  PLT 432*   BMET Recent Labs    04/14/20 2013  NA 139  K 3.7  CL 102  CO2 25  GLUCOSE 115*  BUN 15  CREATININE 0.51  CALCIUM 8.1*    Studies/Results: No results found.  Assessment/Plan: Await new collar will check lateral C-spine with new collar arrives confirming alignment  LOS: 2 days     Elaina Hoops 04/17/2020, 8:03 AM

## 2020-04-17 NOTE — Progress Notes (Signed)
PT Cancellation Note  Patient Details Name: Leah Franco MRN: [id number] DOB: 07/19/1965   Cancelled Treatment:    Reason Eval/Treat Not Completed: Other (comment) Pt received repeat cervical imaging this afternoon and was noted to have more severe posterior displacement of C2 fracture and "significantly increased displacement of the fracture involving the posterior arch of C1" compared to previous imaging. The MD has been alerted to the findings, but PT will hold on mobility at this time until the MD has had a chance to evaluate the pt given this change in imaging. PT will continue to follow and treat as time/schedule allows.   Karma Ganja, PT, DPT   Acute Rehabilitation Department Pager #: [phone]   Otho Bellows 04/17/2020, 4:57 PM

## 2020-04-17 NOTE — Progress Notes (Signed)
Orthopedic Tech Progress Note Patient Details:  Leah Franco 06-11-1965 [id number] Ordered Patients Aspen Cervical Collar Patient ID: Leah Franco, female   DOB: December 18, 1965, 54 y.o.   MRN: [id number]   Leah Franco 04/17/2020, 6:40 AM

## 2020-04-17 NOTE — Plan of Care (Signed)
  Problem: Education: Goal: Knowledge of General Education information will improve Description: Including pain rating scale, medication(s)/side effects and non-pharmacologic comfort measures Outcome: Progressing   Problem: Health Behavior/Discharge Planning: Goal: Ability to manage health-related needs will improve Outcome: Progressing   Problem: Clinical Measurements: Goal: Ability to maintain clinical measurements within normal limits will improve Outcome: Progressing Goal: Will remain free from infection Outcome: Progressing Goal: Diagnostic test results will improve Outcome: Progressing Goal: Respiratory complications will improve Outcome: Progressing   Problem: Activity: Goal: Risk for activity intolerance will decrease Outcome: Progressing   Problem: Nutrition: Goal: Adequate nutrition will be maintained Outcome: Progressing   Problem: Coping: Goal: Level of anxiety will decrease Outcome: Progressing   Problem: Elimination: Goal: Will not experience complications related to bowel motility Outcome: Progressing   Problem: Pain Managment: Goal: General experience of comfort will improve Outcome: Progressing   Problem: Safety: Goal: Ability to remain free from injury will improve Outcome: Progressing   Problem: Skin Integrity: Goal: Risk for impaired skin integrity will decrease Outcome: Progressing   Problem: Education: Goal: Ability to verbalize activity precautions or restrictions will improve Outcome: Progressing Goal: Knowledge of the prescribed therapeutic regimen will improve Outcome: Progressing Goal: Understanding of discharge needs will improve Outcome: Progressing   Problem: Activity: Goal: Ability to avoid complications of mobility impairment will improve Outcome: Progressing Goal: Ability to tolerate increased activity will improve Outcome: Progressing Goal: Will remain free from falls Outcome: Progressing   Problem: Bowel/Gastric: Goal:  Gastrointestinal status for postoperative course will improve Outcome: Progressing   Problem: Clinical Measurements: Goal: Ability to maintain clinical measurements within normal limits will improve Outcome: Progressing Goal: Postoperative complications will be avoided or minimized Outcome: Progressing   Problem: Pain Management: Goal: Pain level will decrease Outcome: Progressing   Problem: Skin Integrity: Goal: Will show signs of wound healing Outcome: Progressing   Problem: Health Behavior/Discharge Planning: Goal: Identification of resources available to assist in meeting health care needs will improve Outcome: Progressing   Problem: Bladder/Genitourinary: Goal: Urinary functional status for postoperative course will improve Outcome: Progressing

## 2020-04-17 NOTE — Progress Notes (Addendum)
Pt with foley from urinary retention since 12/7 2240. This RN spoke to Dr. Saintclair Halsted regarding starting Urecholine. He gave orders to start the Urecholine and to verify dosage with pharmacy. This RN spoke to the pharmacist, who recommended 25 mg Urecholine TID.   Justice Rocher, RN

## 2020-04-17 NOTE — Progress Notes (Signed)
Notified by radiology of pt xray results. Dr. Saintclair Halsted was called to make aware that the results are available.   Justice Rocher, RN

## 2020-04-18 ENCOUNTER — Other Ambulatory Visit: Payer: Self-pay | Admitting: Neurosurgery

## 2020-04-18 NOTE — TOC CAGE-AID Note (Signed)
Transition of Care [facility]) - CAGE-AID Screening   Patient Details  Name: Leah Franco MRN: [id number] Date of Birth: 07/04/1965  Transition of Care [facility]) CM/SW Contact:    Vinie Sill, [place] Phone Number: 04/18/2020, 1:59 PM   Clinical Narrative:  Cage_Aid screening completed- patient declined SA concerns and resources. Patient expressed sometime feeling overwhelmed and depress mood, however, she has positive support system that keeps her motivated and encouraged. CSW provided Mental Health resources.  Thurmond Butts, MSW, LCSWA Clinical Social Worker   CAGE-AID Screening:    Have You Ever Felt You Ought to Cut Down on Your Drinking or Drug Use?: No Have People Annoyed You By [employer] Your Drinking Or Drug Use?: No Have You Felt Bad Or Guilty About Your Drinking Or Drug Use?: No Have You Ever Had a Drink or Used Drugs First Thing In The Morning to Steady Your Nerves or to Get Rid of a Hangover?: No CAGE-AID Score: 0  Substance Abuse Education Offered: Yes

## 2020-04-18 NOTE — Progress Notes (Signed)
Orthopedic Tech Progress Note Patient Details:  EUNICE WINECOFF Sep 16, 1965 [id number] Order called into [facility] Patient ID: Maureen Ralphs, female   DOB: [birth year], 54 y.o.   MRN: [id number]   Chip Boer 04/18/2020, 1:52 PM

## 2020-04-18 NOTE — Progress Notes (Signed)
PT Cancellation Note  Patient Details Name: Leah Franco MRN: [id number] DOB: [birth year]   Cancelled Treatment:    Reason Eval/Treat Not Completed: Medical issues which prohibited therapy. New plans from neurosurgery to place the pt in a halo on Monday 12/13. PT will continue to follow and re-evaluate following surgical intervention.   Karma Ganja, PT, DPT   Acute Rehabilitation Department Pager #: [phone]   Otho Bellows 04/18/2020, 2:31 PM

## 2020-04-18 NOTE — TOC Progression Note (Signed)
Transition of Care [facility]) - Progression Note    Patient Details  Name: Leah Franco MRN: [id number] Date of Birth: 1965-07-31  Transition of Care [facility]) CM/SW [place], [place] Phone Number: 04/18/2020, 2:04 PM  Clinical Narrative:     CSW informed patient she only has one SNF bed offer. Patient states she will more than likely prefer to go home once ready for discharge. Patient inquired about her billing/insurance cover age-CSW advised her to contact billing department-number was provided.  Thurmond Butts, MSW, [facility] Clinical Social Worker   Expected Discharge Plan: [place]    Expected Discharge Plan and Services Expected Discharge Plan: [place] In-house Referral: Clinical Social Work                                             Social Determinants of Health (SDOH) Interventions    Readmission Risk Interventions No flowsheet data found.

## 2020-04-18 NOTE — Progress Notes (Signed)
Subjective: Patient reports mild to moderate neck pain. Difficult to go from a lying down position to sitting up.   Objective: Vital signs in last 24 hours: Temp:  [97.8 F (36.6 C)-98.3 F (36.8 C)] 98.3 F (36.8 C) (12/10 1157) Pulse Rate:  [91-115] 91 (12/10 1157) Resp:  [19-20] 20 (12/10 1157) BP: (121-170)/(81-103) 147/103 (12/10 1157) SpO2:  [97 %-99 %] 97 % (12/10 1157)  Intake/Output from previous day: 12/09 0701 - 12/10 0700 In: 35 [P.O.:780] Out: 2350 [Urine:2350] Intake/Output this shift: Total I/O In: 240 [P.O.:240] Out: -   Neurologic: Grossly normal  Lab Results: Lab Results  Component Value Date   WBC 9.7 04/14/2020   HGB 11.2 (L) 04/14/2020   HCT 37.1 04/14/2020   MCV 103.3 (H) 04/14/2020   PLT 432 (H) 04/14/2020   Lab Results  Component Value Date   INR 1.0 03/12/2020   BMET Lab Results  Component Value Date   NA 139 04/14/2020   K 3.7 04/14/2020   CL 102 04/14/2020   CO2 25 04/14/2020   GLUCOSE 115 (H) 04/14/2020   BUN 15 04/14/2020   CREATININE 0.51 04/14/2020   CALCIUM 8.1 (L) 04/14/2020    Studies/Results: DG Cervical Spine 1 View  Result Date: 04/17/2020 CLINICAL DATA:  C2 fracture. EXAM: DG CERVICAL SPINE - 1 VIEW COMPARISON:  April 14, 2020. FINDINGS: There is now noted severe posterior displacement of the dens component of the C2 fracture noted on prior exam. Mildly displaced posterior spinous fracture of C5 is again noted. Significantly increased displacement of the fracture involving the posterior arc of C1 is also noted. Moderate degenerative disc disease is noted at C4-5, C5-6 and C6-7. Mild grade 1 anterolisthesis of C3-4 is noted secondary to posterior facet joint hypertrophy. IMPRESSION: Interval development of severe posterior displacement of the dens component of the C2 fracture compared to prior exam. Significantly increased displacement of the fracture involving the posterior arch of C1 is noted as well. These results will  be called to the ordering clinician or representative by the Radiologist Assistant, and communication documented in the PACS or zVision Dashboard. Electronically Signed   By: Marijo Conception M.D.   On: 04/17/2020 10:44    Assessment/Plan: xrays yesterday showed further displacement of the dens posteriorly as well as further displacement of the posterior arch fracture of C1. She is neurologically intact. We discussed her plan of care together. Ultimately, I think placing her in a halo is going to be the best option at this point and she agrees. We will plan to do this on Monday. Continue collar and pain management.    LOS: 3 days    Ocie Cornfield Febe Champa 04/18/2020, 1:40 PM

## 2020-04-19 MED ORDER — OXYCODONE HCL 5 MG PO TABS
5.0000 mg | ORAL_TABLET | ORAL | Status: DC | PRN
  Administered 2020-04-19 – 2020-04-20 (×5): 5 mg via ORAL
  Filled 2020-04-19 (×5): qty 1

## 2020-04-19 MED ORDER — HEPARIN SODIUM (PORCINE) 5000 UNIT/ML IJ SOLN
5000.0000 [IU] | Freq: Three times a day (TID) | INTRAMUSCULAR | Status: DC
  Administered 2020-04-19 – 2020-04-20 (×3): 5000 [IU] via SUBCUTANEOUS
  Filled 2020-04-19 (×3): qty 1

## 2020-04-19 NOTE — Plan of Care (Signed)
  Problem: Education: Goal: Knowledge of General Education information will improve Description: Including pain rating scale, medication(s)/side effects and non-pharmacologic comfort measures Outcome: Progressing   Problem: Health Behavior/Discharge Planning: Goal: Ability to manage health-related needs will improve Outcome: Progressing   Problem: Clinical Measurements: Goal: Ability to maintain clinical measurements within normal limits will improve Outcome: Progressing Goal: Will remain free from infection Outcome: Progressing Goal: Diagnostic test results will improve Outcome: Progressing Goal: Respiratory complications will improve Outcome: Progressing   Problem: Activity: Goal: Risk for activity intolerance will decrease Outcome: Progressing   Problem: Nutrition: Goal: Adequate nutrition will be maintained Outcome: Progressing   Problem: Coping: Goal: Level of anxiety will decrease Outcome: Progressing   Problem: Elimination: Goal: Will not experience complications related to bowel motility Outcome: Progressing   Problem: Pain Managment: Goal: General experience of comfort will improve Outcome: Progressing   Problem: Safety: Goal: Ability to remain free from injury will improve Outcome: Progressing   Problem: Skin Integrity: Goal: Risk for impaired skin integrity will decrease Outcome: Progressing   Problem: Education: Goal: Ability to verbalize activity precautions or restrictions will improve Outcome: Progressing Goal: Knowledge of the prescribed therapeutic regimen will improve Outcome: Progressing Goal: Understanding of discharge needs will improve Outcome: Progressing   Problem: Activity: Goal: Ability to avoid complications of mobility impairment will improve Outcome: Progressing Goal: Ability to tolerate increased activity will improve Outcome: Progressing Goal: Will remain free from falls Outcome: Progressing   Problem: Bowel/Gastric: Goal:  Gastrointestinal status for postoperative course will improve Outcome: Progressing   Problem: Clinical Measurements: Goal: Ability to maintain clinical measurements within normal limits will improve Outcome: Progressing Goal: Postoperative complications will be avoided or minimized Outcome: Progressing   Problem: Pain Management: Goal: Pain level will decrease Outcome: Progressing   Problem: Skin Integrity: Goal: Will show signs of wound healing Outcome: Progressing   Problem: Health Behavior/Discharge Planning: Goal: Identification of resources available to assist in meeting health care needs will improve Outcome: Progressing   Problem: Bladder/Genitourinary: Goal: Urinary functional status for postoperative course will improve Outcome: Progressing

## 2020-04-19 NOTE — Progress Notes (Signed)
She is doing well.  Really has no significant neck pain.  She is having headache at the vertex.  Her collar is in place.  She is eating.  She can only eat soup for now because of some dysphagia.  Overall she is doing well, halo Monday

## 2020-04-20 MED ORDER — OXYCODONE HCL 5 MG PO TABS
5.0000 mg | ORAL_TABLET | ORAL | Status: DC
  Administered 2020-04-20 – 2020-04-23 (×15): 5 mg via ORAL
  Filled 2020-04-20 (×15): qty 1

## 2020-04-20 NOTE — Progress Notes (Signed)
This am patient was very upset crying and vomiting c/o of severe neck pain and stating that her pain meds were due at 0700. Dayshift nurse was attending to other patients with critical labs and needs and upon entering room the patient and sister were very upset about the delay in pain meds. RN apologized that meds are prn and not scheduled and about the delay. Dilaudid and zofran given. Patient was relieved and situation was deesculated successfully. Spoke with Dr. Ronnald Ramp about scheduling pain meds to avoid situation in future.

## 2020-04-20 NOTE — Progress Notes (Signed)
Doing ok, neck is quite sore, in collar, neuro nonfocal, for halo tomorrow

## 2020-04-21 ENCOUNTER — Inpatient Hospital Stay (HOSPITAL_COMMUNITY): Payer: PRIVATE HEALTH INSURANCE | Admitting: Anesthesiology

## 2020-04-21 ENCOUNTER — Encounter (HOSPITAL_COMMUNITY): Payer: Self-pay | Admitting: Neurosurgery

## 2020-04-21 ENCOUNTER — Encounter (HOSPITAL_COMMUNITY)
Admission: EM | Disposition: A | Discharge status: Rehabilitation Facility | Payer: Self-pay | Source: Home / Self Care | Attending: Neurosurgery

## 2020-04-21 ENCOUNTER — Inpatient Hospital Stay (HOSPITAL_COMMUNITY): Payer: PRIVATE HEALTH INSURANCE

## 2020-04-21 HISTORY — PX: HALO APPLICATION: SHX1720

## 2020-04-21 SURGERY — APPLICATION, HALO DEVICE, FOR SPINAL TRACTION
Anesthesia: Monitor Anesthesia Care

## 2020-04-21 MED ORDER — FENTANYL CITRATE (PF) 100 MCG/2ML IJ SOLN
INTRAMUSCULAR | Status: AC
  Administered 2020-04-21: 18:00:00 50 ug via INTRAVENOUS
  Filled 2020-04-21: qty 2

## 2020-04-21 MED ORDER — ONDANSETRON HCL 4 MG/2ML IJ SOLN
INTRAMUSCULAR | Status: AC
  Filled 2020-04-21: qty 2

## 2020-04-21 MED ORDER — LACTATED RINGERS IV SOLN: INTRAVENOUS | Status: DC | PRN

## 2020-04-21 MED ORDER — FENTANYL CITRATE (PF) 250 MCG/5ML IJ SOLN
INTRAMUSCULAR | Status: AC
  Filled 2020-04-21: qty 5

## 2020-04-21 MED ORDER — CHLORHEXIDINE GLUCONATE CLOTH 2 % EX PADS: 6.0000 | MEDICATED_PAD | Freq: Once | CUTANEOUS | Status: DC

## 2020-04-21 MED ORDER — LIDOCAINE-EPINEPHRINE 1 %-1:100000 IJ SOLN
INTRAMUSCULAR | Status: DC | PRN
  Administered 2020-04-21: 3 mL
  Administered 2020-04-21: 7 mL

## 2020-04-21 MED ORDER — CEFAZOLIN SODIUM-DEXTROSE 2-4 GM/100ML-% IV SOLN
2.0000 g | Freq: Once | INTRAVENOUS | Status: AC
  Administered 2020-04-21: 2 g via INTRAVENOUS
  Filled 2020-04-21: qty 100

## 2020-04-21 MED ORDER — FENTANYL CITRATE (PF) 100 MCG/2ML IJ SOLN
25.0000 ug | INTRAMUSCULAR | Status: DC | PRN
  Administered 2020-04-21: 50 ug via INTRAVENOUS

## 2020-04-21 MED ORDER — LACTATED RINGERS IV SOLN: INTRAVENOUS | Status: DC

## 2020-04-21 MED ORDER — CEFAZOLIN SODIUM-DEXTROSE 2-4 GM/100ML-% IV SOLN
2.0000 g | INTRAVENOUS | Status: DC
  Filled 2020-04-21: qty 100

## 2020-04-21 MED ORDER — ONDANSETRON HCL 4 MG/2ML IJ SOLN
4.0000 mg | Freq: Once | INTRAMUSCULAR | Status: AC | PRN
  Administered 2020-04-21: 17:00:00 4 mg via INTRAVENOUS

## 2020-04-21 MED ORDER — MIDAZOLAM HCL 5 MG/5ML IJ SOLN
INTRAMUSCULAR | Status: DC | PRN
  Administered 2020-04-21: 2 mg via INTRAVENOUS

## 2020-04-21 MED ORDER — ACETAMINOPHEN 10 MG/ML IV SOLN: 500.0000 mg | Freq: Four times a day (QID) | INTRAVENOUS | Status: DC

## 2020-04-21 MED ORDER — DEXMEDETOMIDINE (PRECEDEX) IN NS 20 MCG/5ML (4 MCG/ML) IV SYRINGE
PREFILLED_SYRINGE | INTRAVENOUS | Status: DC | PRN
  Administered 2020-04-21 (×5): 4 ug via INTRAVENOUS

## 2020-04-21 MED ORDER — LIDOCAINE-EPINEPHRINE 1 %-1:100000 IJ SOLN
INTRAMUSCULAR | Status: AC
  Filled 2020-04-21: qty 1

## 2020-04-21 MED ORDER — CHLORHEXIDINE GLUCONATE 0.12 % MT SOLN
15.0000 mL | Freq: Once | OROMUCOSAL | Status: AC
  Administered 2020-04-21: 15:00:00 15 mL via OROMUCOSAL
  Filled 2020-04-21: qty 15

## 2020-04-21 MED ORDER — FENTANYL CITRATE (PF) 100 MCG/2ML IJ SOLN
INTRAMUSCULAR | Status: DC | PRN
  Administered 2020-04-21 (×2): 50 ug via INTRAVENOUS

## 2020-04-21 MED ORDER — MIDAZOLAM HCL 2 MG/2ML IJ SOLN
INTRAMUSCULAR | Status: AC
  Filled 2020-04-21: qty 2

## 2020-04-21 MED ORDER — PROPOFOL 10 MG/ML IV BOLUS
INTRAVENOUS | Status: AC
  Filled 2020-04-21: qty 20

## 2020-04-21 MED ORDER — BACITRACIN ZINC 500 UNIT/GM EX OINT
TOPICAL_OINTMENT | CUTANEOUS | Status: AC
  Filled 2020-04-21: qty 28.35

## 2020-04-21 MED ORDER — PROPOFOL 10 MG/ML IV BOLUS
INTRAVENOUS | Status: DC | PRN
  Administered 2020-04-21: 60 ug/kg/min via INTRAVENOUS

## 2020-04-21 MED ORDER — ACETAMINOPHEN 10 MG/ML IV SOLN
500.0000 mg | Freq: Four times a day (QID) | INTRAVENOUS | Status: DC | PRN
  Filled 2020-04-21: qty 50

## 2020-04-21 MED ORDER — ONDANSETRON HCL 4 MG/2ML IJ SOLN
INTRAMUSCULAR | Status: DC | PRN
  Administered 2020-04-21: 4 mg via INTRAVENOUS

## 2020-04-21 MED ORDER — CHLORHEXIDINE GLUCONATE 4 % EX LIQD
CUTANEOUS | Status: AC
  Filled 2020-04-21: qty 15

## 2020-04-21 MED ORDER — DEXAMETHASONE SODIUM PHOSPHATE 10 MG/ML IJ SOLN
10.0000 mg | Freq: Once | INTRAMUSCULAR | Status: DC
  Filled 2020-04-21: qty 1

## 2020-04-21 MED ORDER — BACITRACIN ZINC 500 UNIT/GM EX OINT
TOPICAL_OINTMENT | CUTANEOUS | Status: DC | PRN
  Administered 2020-04-21: 1 via TOPICAL

## 2020-04-21 MED ORDER — POVIDONE-IODINE 10 % EX SOLN
CUTANEOUS | Status: DC | PRN
  Administered 2020-04-21: 1 via TOPICAL

## 2020-04-21 MED ORDER — ACETAMINOPHEN 500 MG PO TABS
1000.0000 mg | ORAL_TABLET | Freq: Once | ORAL | Status: DC
  Filled 2020-04-21: qty 2

## 2020-04-21 SURGICAL SUPPLY — 24 items
COVER BACK TABLE 60X90IN (DRAPES) ×3 IMPLANT
COVER WAND RF STERILE (DRAPES) IMPLANT
DECANTER SPIKE VIAL GLASS SM (MISCELLANEOUS) ×3 IMPLANT
GAUZE 4X4 16PLY RFD (DISPOSABLE) IMPLANT
GLOVE BIO SURGEON STRL SZ7 (GLOVE) IMPLANT
GLOVE BIO SURGEON STRL SZ8 (GLOVE) IMPLANT
GLOVE BIOGEL PI IND STRL 7.0 (GLOVE) ×1 IMPLANT
GLOVE BIOGEL PI IND STRL 7.5 (GLOVE) ×2 IMPLANT
GLOVE BIOGEL PI INDICATOR 7.0 (GLOVE) ×2
GLOVE BIOGEL PI INDICATOR 7.5 (GLOVE) ×4
GLOVE ECLIPSE 7.5 STRL STRAW (GLOVE) ×6 IMPLANT
GLOVE EXAM NITRILE XL STR (GLOVE) IMPLANT
GLOVE INDICATOR 8.5 STRL (GLOVE) ×3 IMPLANT
GOWN STRL REUS W/ TWL LRG LVL3 (GOWN DISPOSABLE) IMPLANT
GOWN STRL REUS W/ TWL XL LVL3 (GOWN DISPOSABLE) IMPLANT
GOWN STRL REUS W/TWL LRG LVL3 (GOWN DISPOSABLE)
GOWN STRL REUS W/TWL XL LVL3 (GOWN DISPOSABLE)
KIT BASIN OR (CUSTOM PROCEDURE TRAY) ×3 IMPLANT
KIT TURNOVER KIT B (KITS) ×3 IMPLANT
NEEDLE HYPO 25X1 1.5 SAFETY (NEEDLE) ×3 IMPLANT
PAD ARMBOARD 7.5X6 YLW CONV (MISCELLANEOUS) IMPLANT
SYR CONTROL 10ML LL (SYRINGE) ×3 IMPLANT
TOWEL GREEN STERILE (TOWEL DISPOSABLE) ×3 IMPLANT
TOWEL GREEN STERILE FF (TOWEL DISPOSABLE) ×3 IMPLANT

## 2020-04-21 NOTE — Progress Notes (Signed)
Subjective: Patient reports Condition neck pain but otherwise asymptomatic neurologically  Objective: Vital signs in last 24 hours: Temp:  [98 F (36.7 C)-98.3 F (36.8 C)] 98.3 F (36.8 C) (12/13 1105) Pulse Rate:  [79-122] 122 (12/13 1105) Resp:  [18-20] 20 (12/13 1105) BP: (107-130)/(71-98) 130/90 (12/13 1105) SpO2:  [98 %-100 %] 100 % (12/13 1105)  Intake/Output from previous day: 12/12 0701 - 12/13 0700 In: 1150 [P.O.:1150] Out: 1650 [Urine:1650] Intake/Output this shift: No intake/output data recorded.  Awake alert strength 5 and 5: Good position  Lab Results: No results for input(s): WBC, HGB, HCT, PLT in the last 72 hours. BMET No results for input(s): NA, K, CL, CO2, GLUCOSE, BUN, CREATININE, CALCIUM in the last 72 hours.  Studies/Results: No results found.  Assessment/Plan: 54 year old female with unstable odontoid fracture presents today for halo placement I extensively gone over the risks and benefits of halo application under fluoroscopy in the OR with her as well as perioperative course expectations of outcome and alternatives to surgery and she understands and agrees to proceed forward.  LOS: 6 days     Leah Franco 04/21/2020, 2:20 PM

## 2020-04-21 NOTE — Progress Notes (Signed)
°   04/21/20 1146  Clinical Encounter Type  Visited With Patient not available  Visit Type Initial  Referral From Nurse  Consult/Referral To Chaplain   Chaplain responded to AD request. Pt was on the phone. Chaplain will try again at a later time.  This note was prepared by Chaplain Resident, Dante Gang, MDiv. For questions, please contact by phone at [phone].

## 2020-04-21 NOTE — Op Note (Signed)
Preoperative diagnosis: Type III odontoid fracture behaving like type II C1 ring fracture additional distal cervical fractures  Postoperative diagnosis: Same  Procedure: Halo application for stabilization of unstable odontoid fracture with fluoroscopic assistance  Surgeon: Dominica Severin Jalessa Peyser  Assistant: Nash Shearer  Anesthesia: MAC with local  EBL: 73  HPI: 54 year old female who had a fall down some stairs and sustained a type III odontoid fracture that was initially managed in a collar however displayed itself to be markedly unstable with inability to hold his alignment despite good cervical collar immobilization.  Patient with complicated history was not a candidate anatomically for an addendum anterior odontoid screw.  C1-2 posterior stabilization was not ideal with a C1 ring fracture however patient also wanted to give it a chance to heal without posterior fixation removing significant amount of her cervical mobility.  So after extensively going over the options of nonoperative and the operative management versus halo placement she elects to proceed with halo placement.  So extensive went over the risks and benefits of that procedure with her as well as perioperative course expectations of outcome and alternatives of surgery and she understood and agreed to proceed forward.  Operative procedure: Patient was brought into the OR was induced with some light sedation positioned on the horseshoe [place] with fluoroscopy.  Her head was shaved prepped and draped the halo ring was then positioned overhead and I selected for pin entry sites all these were prepped and infiltrated with 1% lidocaine with epi then 4 pins were anchored in place torqued down to approximately 8 pound feet.  Then I assembled the lateral shocks in the vest and the stabilizing posts and under fluoroscopy brought her down to her back in alignment and then anchored in place torquing all the nuts on the ring and in the vest.  Everything  was then recalibrated to confirm adequate tightening and patient was remitted to recovery in stable condition.  At the end the case all needle count sponge counts were correct.

## 2020-04-21 NOTE — Anesthesia Postprocedure Evaluation (Signed)
Anesthesia Post Note  Patient: Leah Franco  Procedure(s) Performed: HALO TRACTION APPLICATION (N/A )     Patient location during evaluation: PACU Anesthesia Type: MAC Level of consciousness: awake and alert, awake and oriented Pain management: pain level controlled Vital Signs Assessment: post-procedure vital signs reviewed and stable Respiratory status: spontaneous breathing, nonlabored ventilation and respiratory function stable Cardiovascular status: stable, blood pressure returned to baseline and tachycardic Postop Assessment: no apparent nausea or vomiting Anesthetic complications: no   No complications documented.  Last Vitals:  Vitals:   04/21/20 1045 04/21/20 1105  BP: 130/90 130/90  Pulse:  (!) 122  Resp:  20  Temp:  36.8 C  SpO2:  100%    Last Pain:  Vitals:   04/21/20 1330  TempSrc:   PainSc: 5                  Catalina Gravel

## 2020-04-21 NOTE — Anesthesia Preprocedure Evaluation (Signed)
Anesthesia Evaluation  Patient identified by MRN, date of birth, ID band Patient awake    Reviewed: Allergy & Precautions, NPO status , Patient's Chart, lab work & pertinent test results, reviewed documented beta blocker date and time   History of Anesthesia Complications Negative for: history of anesthetic complications  Airway Mallampati: II  TM Distance: >3 FB Neck ROM: Limited    Dental  (+) Loose Upper permanent retainer:   Pulmonary COPD,    Pulmonary exam normal breath sounds clear to auscultation       Cardiovascular hypertension, Pt. on home beta blockers and Pt. on medications + CAD and +CHF  Normal cardiovascular exam Rhythm:Regular Rate:Normal     Neuro/Psych Cervical fracture   C-spine not cleared    GI/Hepatic negative GI ROS, Neg liver ROS, Gastric bypass   Endo/Other  diabetes, Type 2, Oral Hypoglycemic AgentsObesity Lupus  Renal/GU negative Renal ROS     Musculoskeletal  (+) Arthritis ,   Abdominal   Peds  Hematology  (+) Blood dyscrasia, anemia ,   Anesthesia Other Findings Day of surgery medications reviewed with the patient.  Reproductive/Obstetrics                             Anesthesia Physical Anesthesia Plan  ASA: II  Anesthesia Plan: MAC   Post-op Pain Management:    Induction: Intravenous  PONV Risk Score and Plan: 2  Airway Management Planned: Nasal Cannula  Additional Equipment:   Intra-op Plan:   Post-operative Plan:   Informed Consent: I have reviewed the patients History and Physical, chart, labs and discussed the procedure including the risks, benefits and alternatives for the proposed anesthesia with the patient or authorized representative who has indicated his/her understanding and acceptance.     Dental advisory given  Plan Discussed with: CRNA and Anesthesiologist  Anesthesia Plan Comments:         Anesthesia Quick  Evaluation

## 2020-04-21 NOTE — Transfer of Care (Signed)
Immediate Anesthesia Transfer of Care Note  Patient: Leah Franco  Procedure(s) Performed: HALO TRACTION APPLICATION (N/A )  Patient Location: PACU  Anesthesia Type:MAC  Level of Consciousness: awake, alert , oriented and patient cooperative  Airway & Oxygen Therapy: Patient Spontanous Breathing  Post-op Assessment: Report given to RN and Post -op Vital signs reviewed and stable  Post vital signs: Reviewed and stable  Last Vitals:  Vitals Value Taken Time  BP 142/99 04/21/20 1704  Temp    Pulse 85 04/21/20 1707  Resp 11 04/21/20 1707  SpO2 89 % 04/21/20 1707  Vitals shown include unvalidated device data.  Last Pain:  Vitals:   04/21/20 1330  TempSrc:   PainSc: 5       Patients Stated Pain Goal: 4 (73/71/06 2694)  Complications: No complications documented.

## 2020-04-21 NOTE — Progress Notes (Signed)
Patient received from PACU back to room 4NP02 alert and oriented but complaining of pain. Dilaudid, oxy, flexeril, and tylenol given as well as gabapentin per request. Sister in to visit. Report passed on to night nurse Wende Crease, RN. Skin swam performed with night RN Roselyn Reef. Supper ordered. No acute distress noted.

## 2020-04-21 NOTE — Anesthesia Procedure Notes (Signed)
Procedure Name: MAC Date/Time: 04/21/2020 3:07 PM Performed by: Oletta Lamas, CRNA Pre-anesthesia Checklist: Patient identified, Emergency Drugs available, Suction available, Patient being monitored and Timeout performed Patient Re-evaluated:Patient Re-evaluated prior to induction Oxygen Delivery Method: Simple face mask

## 2020-04-22 ENCOUNTER — Encounter (HOSPITAL_COMMUNITY): Payer: Self-pay | Admitting: Neurosurgery

## 2020-04-22 DIAGNOSIS — S12100D Unspecified displaced fracture of second cervical vertebra, subsequent encounter for fracture with routine healing: Secondary | ICD-10-CM

## 2020-04-22 MED ORDER — ENSURE ENLIVE PO LIQD
237.0000 mL | Freq: Two times a day (BID) | ORAL | Status: DC
  Administered 2020-04-22 – 2020-05-05 (×23): 237 mL via ORAL
  Filled 2020-04-22 (×2): qty 237

## 2020-04-22 NOTE — Consult Note (Signed)
Physical Medicine and Rehabilitation Consult Reason for Consult: Spinal fracture Referring Physician: Dr. Saintclair Halsted   HPI: Leah Franco is a 54 y.o. right-handed female with history of diabetes mellitus peripheral neuropathy, left foot drop, TIA, , morbid obesity with gastric bypass, total hip arthroplasty 2018 as well as history of juvenile scoliosis and 9 surgeries of her spine most recently by Dr. Prince Rome at [facility].  Per chart review independent prior to admission.  Two-level home bed and bath upstairs 4 steps to entry.  Lives with her sister and family and assistance as needed.  Patient works full-time remotely as a Careers information officer.  Presented 04/14/2020 when her leg gave way falling down stairs no loss of consciousness.  Cranial CT scan negative for acute intracranial abnormality.  Nondepressed left occipital calvarial fracture.  Very large biparietal scalp hematoma.  CT cervical spine showed acute mildly displaced type III odontoid fracture acute minimally displaced fractures of C1 posterior arch bilaterally.  Acute mildly displaced fracture of the left C5 lamina extending to the spinous process.  Subtle widening of L3-4 facet joint with 3 mm grade 1 anterior listhesis.  Fractures of C6-C7 spinous process appeared chronic.  Admission chemistries unremarkable glucose 115 calcium 8.1.  Patient underwent halo application for stabilization of unstable odontoid fracture 04/21/2020 per Dr. Saintclair Halsted.  [facility] course bouts of urinary retention maintained on Urecholine.  Tolerating a regular diet.  Therapy evaluations completed with recommendations of physical medicine rehab consult.   Sleeping poorly due to pain- pain regimen isn't working, even since they scheduled oxycodone- not enough, even when it's working. Even after IV pain meds.   Has L foot drop- was cause of fall- L AFO didn't work, so stopped wearing.  LBM today.  Also having GERD SX's in spite of being on PPI.  C/o wavy text with  vision when reads Frontal HA- tylenol helps  Review of Systems  Constitutional: Positive for malaise/fatigue. Negative for chills and fever.  HENT: Negative for hearing loss.   Eyes: Negative for double vision.  Respiratory: Negative for cough and shortness of breath.   Cardiovascular: Positive for leg swelling. Negative for chest pain and palpitations.  Gastrointestinal: Positive for constipation. Negative for heartburn, nausea and vomiting.  Genitourinary: Negative for dysuria, flank pain and hematuria.  Musculoskeletal: Positive for back pain, falls and myalgias.  Skin: Negative for rash.  All other systems reviewed and are negative.  Past Medical History:  Diagnosis Date  . Anemia   . Arthritis   . Back pain   . Diabetes mellitus without complication ([place])   . Fatigue   . Fibroids   . GI bleeding 03/2020  . Hemorrhoid   . Hypertension   . Mitral valve prolapse   . MRSA (methicillin resistant Staphylococcus aureus)   . Night sweats   . Wound infection    Past Surgical History:  Procedure Laterality Date  . BACK SURGERY    . BREAST LUMPECTOMY     lft  . ESOPHAGOGASTRODUODENOSCOPY  03/12/2020   HOT HEMOSTASIS (ARGON PLASMA COAGULATION/BICAP) (N/A )  . ESOPHAGOGASTRODUODENOSCOPY (EGD) WITH PROPOFOL N/A 03/12/2020   Procedure: ESOPHAGOGASTRODUODENOSCOPY (EGD) WITH PROPOFOL;  Surgeon: Clarene Essex, MD;  Location: [facility];  Service: Endoscopy;  Laterality: N/A;  . GASTRIC BYPASS    . HALO APPLICATION N/A [id number]   Procedure: HALO TRACTION APPLICATION;  Surgeon: Kary Kos, MD;  Location: [facility];  Service: Neurosurgery;  Laterality: N/A;  . HERNIA REPAIR    . HOT HEMOSTASIS  N/A 03/12/2020   Procedure: HOT HEMOSTASIS (ARGON PLASMA COAGULATION/BICAP);  Surgeon: Clarene Essex, MD;  Location: [facility];  Service: Endoscopy;  Laterality: N/A;  . SPINE SURGERY     corrective spine collapsed, scoliosis and scoliosis revision  . TOTAL HIP ARTHROPLASTY  2018   Family History   Problem Relation Age of Onset  . BRCA 1/2 Mother        BRCA2+ by default, no testing  . Breast cancer Sister 59       BRCA2+  . BRCA 1/2 Sister 17       BRCA2 +  . Pancreatic cancer Maternal Uncle        Dx in his 55s  . BRCA 1/2 Maternal Uncle        BRCA2+  . Prostate cancer Maternal Grandfather   . Breast cancer Maternal Aunt        dx in 47s  . Ovarian cancer Maternal Aunt        dx in 41s  . Esophageal cancer Maternal Uncle 62  . BRCA 1/2 Maternal Uncle        BRCA2+  . Colon cancer Maternal Uncle 20  . Breast cancer Cousin        dx in her 10s  . BRCA 1/2 Cousin        dx in her 50s; BRCA2+   Social History:  reports that she has never smoked. She has never used smokeless tobacco. She reports that she does not drink alcohol and does not use drugs. Allergies:  Allergies  Allergen Reactions  . Lactose Intolerance (Gi) Nausea And Vomiting and Other (See Comments)    gassy  . Tape Rash    Reaction to most kinds of adhesive   Medications Prior to Admission  Medication Sig Dispense Refill  . atorvastatin (LIPITOR) 10 MG tablet Take 10 mg by mouth every evening.     . cyclobenzaprine (FLEXERIL) 5 MG tablet Take 5 mg by mouth at bedtime as needed for muscle spasms.     Marland Kitchen denosumab (PROLIA) 60 MG/ML SOSY injection Inject 60 mg into the skin every 6 (six) months.    . diphenhydramine-acetaminophen (TYLENOL PM) 25-500 MG TABS tablet Take 4 tablets by mouth at bedtime.    . DULoxetine (CYMBALTA) 60 MG capsule Take 60 mg by mouth every evening.    . ergocalciferol (VITAMIN D2) 50000 UNITS capsule Take 50,000 Units by mouth See admin instructions. Take one capsule (50000 units) by mouth four times weekly in the evening - Monday, Wednesday, Friday, Saturday    . etanercept (ENBREL) 50 MG/ML injection Inject 50 mg into the skin every Tuesday.    . gabapentin (NEURONTIN) 300 MG capsule Take 600 mg by mouth 3 (three) times daily. At bedtime take with an 800 mg tablet for a total  dose of 1400 mg at night    . gabapentin (NEURONTIN) 800 MG tablet Take 800 mg by mouth at bedtime.     . metFORMIN (GLUCOPHAGE) 500 MG tablet Take 500-1,000 mg by mouth See admin instructions. Take one tablet (500 mg) by mouth with breakfast and lunch; take two tablets (1000 mg) at bedtime    . metoprolol tartrate (LOPRESSOR) 25 MG tablet Take 25 mg by mouth 2 (two) times daily.     . pantoprazole (PROTONIX) 40 MG tablet Take 1 tablet (40 mg total) by mouth 2 (two) times daily. 60 tablet 0  . Semaglutide,0.25 or 0.5MG/DOS, (OZEMPIC, 0.25 OR 0.5 MG/DOSE,) 2 MG/1.5ML SOPN Inject 0.5  mg into the skin every Tuesday.     . sucralfate (CARAFATE) 1 g tablet Take 1 tablet (1 g total) by mouth 4 (four) times daily. 120 tablet 0    Home: Home Living Family/patient expects to be discharged to:: Private residence Living Arrangements: Other relatives Available Help at Discharge: Family,Available 24 hours/day (sister works from home, can be with pt 24/7 at d/c) Type of Home: House Home Access: Stairs to enter [employer] of Steps: [place]: Two level,Bed/bath upstairs Bathroom Shower/Tub: Tub/shower Psychologist, occupational: Standard Home Equipment: Environmental consultant - 2 wheels,Cane - single point,Bedside commode,Shower Physicist, medical Equipment: Reacher,Sock aid,Long-handled shoe horn,Long-handled sponge Additional Comments: Pt lives with her sister and nephew.  Pt reports sister works from home   Functional History: Prior Function Level of Independence: Independent Comments: reports multiple falls secondary to neuropathy.  Pt works full time remotely as a Retail banker Status:  Mobility: Bed Mobility Overal bed mobility: Needs Assistance Bed Mobility: Supine to Sit Rolling: Min assist Sidelying to sit: Min assist Supine to sit: Mod assist,HOB elevated Sit to sidelying: Mod assist General bed mobility comments: Mod assist for trunk elevation via  HHA, LE progression to EOB. Increased time, unable to log roll given halo collar. Transfers Overall transfer level: Needs assistance Equipment used: Rolling walker (2 wheeled),1 person hand held assist Transfers: Sit to/from [employer] Sit to Stand: Min assist,From elevated surface Stand pivot transfers: Min assist General transfer comment: Min assist for power up, steadying, VC for hand placement when rising and sitting. STS x3, from EOB x2 and BSC x1. Ambulation/Gait Ambulation/Gait assistance: Min assist Gait Distance (Feet): 60 Feet Assistive device: Rolling walker (2 wheeled) Gait Pattern/deviations: Step-through pattern,Decreased stride length,Decreased dorsiflexion - left General Gait Details: Min assist to steady, guide RW intermittently. VC for navigating hallway Gait velocity: decr    ADL: ADL Overall ADL's : Needs assistance/impaired Eating/Feeding: Independent Grooming: Wash/dry hands,Wash/dry face,Oral care,Min guard,Standing Upper Body Bathing: Set up,Sitting Lower Body Bathing: Sit to/from stand,Moderate assistance Upper Body Dressing : Set up,Sitting Lower Body Dressing: Sit to/from stand,Moderate assistance Toilet Transfer: Min guard,Ambulation,Comfort height toilet,BSC,RW Toileting- Clothing Manipulation and Hygiene: Minimal assistance,Sit to/from stand Functional mobility during ADLs: Min guard,Rolling walker General ADL Comments: Pt requires assist for LEs   Cognition: Cognition Overall Cognitive Status: Within Functional Limits for tasks assessed Orientation Level: Oriented X4 Cognition Arousal/Alertness: Awake/alert Behavior During Therapy: WFL for tasks assessed/performed Overall Cognitive Status: Within Functional Limits for tasks assessed General Comments: Very pleasant, motivated to progress mobility.  Blood pressure (!) 145/101, pulse 99, temperature 98.1 F (36.7 C), resp. rate 20, height 5' 2.99" (1.6 m), weight 85.7 kg, last  menstrual period 11/04/2010, SpO2 100 %. Physical Exam Vitals and nursing note reviewed.  Constitutional:      Comments: Pt awake, alert, on phone with mother, has HALO, NAD  HENT:     Head:     Comments: Large bump/hematoma on top of L frontal area- sutures removed, but has a large associated abrasion; smile equal. Entire right side of face and L forehead is bruised.    Right Ear: External ear normal.     Left Ear: External ear normal.     Nose: Nose normal. No congestion.     Mouth/Throat:     Mouth: Mucous membranes are dry.     Pharynx: Oropharynx is clear. No oropharyngeal exudate.  Eyes:     General:        Right eye: No discharge.  Left eye: No discharge.     Extraocular Movements: Extraocular movements intact.  Neck:     Comments: In HALO- pins look OK Cardiovascular:     Comments: RRR- no JVD Pulmonary:     Comments: CTA B/L- no W/R/R- good air movement Abdominal:     Comments: Soft, NT, ND, (+)BS   Musculoskeletal:     Comments: UEs 5/5 B/L LES 5/5 B/L except L DF which is 0/5  Skin:    General: Skin is warm and dry.     Comments: Bruising on face as described already  Neurological:     Comments: Patient is alert.  Halo vest in place.  Oriented x3. Decreased to light touch in L hand and LEs at baseline- no new changes L foot drop  Psychiatric:        Mood and Affect: Mood normal.        Behavior: Behavior normal.     No results found for this or any previous visit (from the past 24 hour(s)). DG C-Arm 1-60 Min-No Report  Result Date: 04/21/2020 Fluoroscopy was utilized by the requesting physician.  No radiographic interpretation.     Assessment/Plan: Diagnosis: HALO for unstable C2 fx and C1 post arch fx with scalp hematoma and baseline L foot drop.  1. Does the need for close, 24 hr/day medical supervision in concert with the patient's rehab needs make it unreasonable for this patient to be served in a less intensive setting?  Potentially 2. Co-Morbidities requiring supervision/potential complications: HALO, unstable C2 fx from multiple falls, DM, L foot drop, scalp hematoma- very large- hx of bleeding GI ulcer s/p IV iron and transfusions 3. Due to bowel management, safety, skin/wound care, disease management, medication administration and pain management, does the patient require 24 hr/day rehab nursing? Potentially 4. Does the patient require coordinated care of a physician, rehab nurse, therapy disciplines of PT and OT to address physical and functional deficits in the context of the above medical diagnosis(es)? Potentially Addressing deficits in the following areas: balance, endurance, locomotion, strength, transferring, bathing, dressing, feeding, grooming and toileting 5. Can the patient actively participate in an intensive therapy program of at least 3 hrs of therapy per day at least 5 days per week? Yes 6. The potential for patient to make measurable gains while on inpatient rehab is excellent 7. Anticipated functional outcomes upon discharge from inpatient rehab are modified independent  with PT, modified independent and supervision with OT, n/a with SLP. 8. Estimated rehab length of stay to reach the above functional goals is: 1-2 weeks 9. Anticipated discharge destination: Home 10. Overall Rehab/Functional Prognosis: excellent  RECOMMENDATIONS: This patient's condition is appropriate for continued rehabilitative care in the following setting: CIR Patient has agreed to participate in recommended program. Potentially Note that insurance prior authorization may be required for reimbursement for recommended care.  Comment:  1. Suggest increasing scheduled oxycodone OR giving a short term long acting pain medicine to get pain under better control- 2. Suggest additional ideas prn like Ranitidine for GERD Sx's as needed 3. Might require increasing bowel meds if increase PO pain meds.  4. Will need new L AFO since  hers wasn't working 5. Will submit to admissions coordinators 6. Thank you for this consult    Cathlyn Parsons, PA-C 04/22/2020   I have personally performed a face to face diagnostic evaluation of this patient and formulated the key components of the plan.  Additionally, I have personally reviewed laboratory data, imaging studies, as well  as relevant notes and concur with the physician assistant's documentation above.

## 2020-04-22 NOTE — Progress Notes (Signed)
Subjective: Patient reports Overall doing well feels more stable in the halo  Objective: Vital signs in last 24 hours: Temp:  [97.9 F (36.6 C)-98.3 F (36.8 C)] 98 F (36.7 C) (12/14 0417) Pulse Rate:  [75-122] 109 (12/14 0417) Resp:  [9-21] 15 (12/13 2059) BP: (122-175)/(87-109) 125/93 (12/14 0417) SpO2:  [94 %-100 %] 98 % (12/14 0417) Weight:  [85.7 kg] 85.7 kg (12/13 1446)  Intake/Output from previous day: 12/13 0701 - 12/14 0700 In: 1500 [P.O.:700; I.V.:800] Out: -  Intake/Output this shift: No intake/output data recorded.  Awake alert oriented strength 5 out of 5 haloing to position  Lab Results: No results for input(s): WBC, HGB, HCT, PLT in the last 72 hours. BMET No results for input(s): NA, K, CL, CO2, GLUCOSE, BUN, CREATININE, CALCIUM in the last 72 hours.  Studies/Results: DG C-Arm 1-60 Min-No Report  Result Date: 04/21/2020 Fluoroscopy was utilized by the requesting physician.  No radiographic interpretation.    Assessment/Plan: Postop day one application of halo vest for unstable C2 fracture mobilize with physical Occupational Therapy today  LOS: 7 days     Elaina Hoops 04/22/2020, 7:48 AM

## 2020-04-22 NOTE — Progress Notes (Signed)
Nutrition Follow-up  DOCUMENTATION CODES:   Obesity unspecified  INTERVENTION:   -Continue Ensure Enlive po BID, each supplement provides 350 kcal and 20 grams of protein -Continue MVI with minerals daily  NUTRITION DIAGNOSIS:   Increased nutrient needs related to acute illness as evidenced by estimated needs.  Ongoing  GOAL:   Patient will meet greater than or equal to 90% of their needs  Progressing   MONITOR:   PO intake,Supplement acceptance,Skin,Weight trends,Labs,I & O's  REASON FOR ASSESSMENT:   Malnutrition Screening Tool    ASSESSMENT:   54 year old female with history of DM, neuropathy and foot drop in her left leg presents after fall. CT with odontoid fx, spinal process fx/facet fx  12/12- s/p Procedure: Halo application for stabilization of unstable odontoid fracture with fluoroscopic assistance  Reviewed I/O's: +1.5 L x 24 hours and -11.5 L since admission  Spoke with pt and sister, who were both pleasant and in good spirits today. Pt reports her appetite has vastly improved and has been eating most of her meals. Noted documented meal completion 50-100%. Pt also really enjoys Ensure supplements and prefers to vanilla flavor.   Per pt, pt has poor appetite for approximately one year PTA due to recently diagnosed gastric ulcer. Per pt, diet was limited prior to diagnosis about a month ago and reports losing about 40 pounds over the past year. However, no wt data available in chart to support this.   RD discussed importance of good meal and supplement intake to promote healing. She is amenable to continue Ensure supplements.   Medications reviewed and include senokot and vitamin D.   Labs reviewed: CBGS: 128 (inpatient orders for glycemic control are 500 mg metformin daily).   NUTRITION - FOCUSED PHYSICAL EXAM:  Flowsheet Row Most Recent Value  Orbital Region No depletion  Upper Arm Region No depletion  Thoracic and Lumbar Region No depletion  Buccal  Region No depletion  Temple Region No depletion  Clavicle Bone Region No depletion  Clavicle and Acromion Bone Region No depletion  Scapular Bone Region No depletion  Dorsal Hand No depletion  Patellar Region No depletion  Anterior Thigh Region No depletion  Posterior Calf Region No depletion  Edema (RD Assessment) Mild  Hair Reviewed  Eyes Reviewed  Mouth Reviewed  Skin Reviewed  Nails Reviewed       Diet Order:   Diet Order            Diet Carb Modified Fluid consistency: Thin; Room service appropriate? Yes  Diet effective now                 EDUCATION NEEDS:   Not appropriate for education at this time  Skin:  Skin Assessment: Skin Integrity Issues: Skin Integrity Issues:: Other (Comment),Incisions Incisions: closed head Other: upper face laceration with staples  Last BM:  04/20/20  Height:   Ht Readings from Last 1 Encounters:  04/21/20 5' 2.99" (1.6 m)    Weight:   Wt Readings from Last 1 Encounters:  04/21/20 85.7 kg    Ideal Body Weight:  52.3 kg  BMI:  Body mass index is 33.49 kg/m.  Estimated Nutritional Needs:   Kcal:  1800-1950  Protein:  85-100 grams  Fluid:  >/= 1.8 L/day    Loistine Chance, RD, LDN, [place] Registered Dietitian II Certified Diabetes Care and Education Specialist Please refer to [facility] for RD and/or RD on-call/weekend/after hours pager

## 2020-04-22 NOTE — Progress Notes (Signed)
Inpatient Rehab Admissions Coordinator Note:   Per updated therapy recommendations, pt was screened for CIR candidacy by Shann Medal, PT, DPT.  At this time we are recommending a CIR consult and I will place an order per our protocol. Please contact me with questions.   Shann Medal, PT, DPT [phone] 04/22/20 11:34 AM

## 2020-04-22 NOTE — Evaluation (Signed)
Physical Therapy Re-Evaluation Patient Details Name: Leah Franco MRN: [id number] DOB: [birth year] Today's Date: 04/22/2020   History of Present Illness  54 yo female admitted to ED on 12/6 with fall. CT and MRI cervical spine reveal acute mildly displaced type III odontoid fracture, C1 bilateral posterior arch fracture, L L3-4 widening at facet joint, mild edema ALL at dens reveals strain vs disruption. Worsening displacement, s/p Halo collar application on 02/63. 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  Clinical Impression   Pt presents with neck and head pain post-operatively, decreased knowledge and application of spinal precautions, difficulty mobilizing, and decreased activity tolerance vs baseline. Pt to benefit from acute PT to address deficits. Pt ambulated short hallway distance with use of RW and min assist to steady/manage RW, PT reinforcing spinal precautions throughout session. PT recommending CIR to maximize pt independence with mobility s/p halo collar application, pt and sister motivated to return pt to baseline mobility. PT to progress mobility as tolerated, and will continue to follow acutely.      Follow Up Recommendations CIR    Equipment Recommendations  Rolling walker with 5" wheels;3in1 (PT);Hospital bed    Recommendations for Other Services Rehab consult     Precautions / Restrictions Precautions Precautions: Fall Precaution Booklet Issued: Yes (comment) Required Braces or Orthoses: Other Brace Cervical Brace: Hard collar;Other (comment);At all times Other Brace: Halo collar Restrictions Weight Bearing Restrictions: No      Mobility  Bed Mobility Overal bed mobility: Needs Assistance Bed Mobility: Supine to Sit     Supine to sit: Mod assist;HOB elevated     General bed mobility comments: Mod assist for trunk elevation via HHA, LE progression to EOB. Increased time, unable to  log roll given halo collar.    Transfers Overall transfer level: Needs assistance Equipment used: Rolling walker (2 wheeled);1 person hand held assist Transfers: Sit to/from [facility] Sit to Stand: Min assist;From elevated surface Stand pivot transfers: Min assist       General transfer comment: Min assist for power up, steadying, VC for hand placement when rising and sitting. STS x3, from EOB x2 and BSC x1.  Ambulation/Gait Ambulation/Gait assistance: Min assist Gait Distance (Feet): 60 Feet Assistive device: Rolling walker (2 wheeled) Gait Pattern/deviations: Step-through pattern;Decreased stride length;Decreased dorsiflexion - left Gait velocity: decr   General Gait Details: Min assist to steady, guide RW intermittently. VC for navigating hallway  Stairs            Wheelchair Mobility    Modified Rankin (Stroke Patients Only)       Balance Overall balance assessment: Needs assistance;History of Falls Sitting-balance support: No upper extremity supported;Feet supported Sitting balance-[name] Scale: Good     Standing balance support: During functional activity;Single extremity supported Standing balance-[name] Scale: Poor Standing balance comment: reliant on at least SL support in standing                             Pertinent Vitals/Pain Pain Assessment: Faces Faces Pain Scale: Hurts little more Pain Location: head, neck Pain Descriptors / Indicators: Sore;Discomfort;Grimacing;Headache Pain Intervention(s): Limited activity within patient's tolerance;Monitored during session;Repositioned;Relaxation    Home Living Family/patient expects to be discharged to:: Private residence Living Arrangements: Other relatives Available Help at Discharge: Family;Available 24 hours/day (sister works from home, can be with pt 24/7 at d/c) Type of Home: House Home Access:  Stairs to enter   [employer] of Steps: 4 Home Layout: Two  level;Bed/bath upstairs Home Equipment: Walker - 2 wheels;Cane - single point;Bedside commode;Shower seat;Adaptive equipment Additional Comments: Pt lives with her sister and nephew.  Pt reports sister works from home     Prior Function Level of Independence: Independent         Comments: reports multiple falls secondary to neuropathy.  Pt works full time remotely as a Primary school teacher   Dominant Hand: Right    Extremity/Trunk Assessment   Upper Extremity Assessment Upper Extremity Assessment: Defer to OT evaluation    Lower Extremity Assessment Lower Extremity Assessment: Generalized weakness;LLE deficits/detail;RLE deficits/detail RLE Sensation: history of peripheral neuropathy LLE Deficits / Details: history of foot drop LLE Sensation: history of peripheral neuropathy    Cervical / Trunk Assessment Cervical / Trunk Assessment: Other exceptions Cervical / Trunk Exceptions: Halo collar  Communication   Communication: No difficulties  Cognition Arousal/Alertness: Awake/alert Behavior During Therapy: WFL for tasks assessed/performed Overall Cognitive Status: Within Functional Limits for tasks assessed                                 General Comments: Very pleasant, motivated to progress mobility.      General Comments      Exercises     Assessment/Plan    PT Assessment Patient needs continued PT services  PT Problem List Decreased strength;Decreased mobility;Decreased activity tolerance;Decreased balance;Decreased knowledge of use of DME;Pain;Decreased knowledge of precautions;Decreased safety awareness;Decreased coordination;Impaired sensation       PT Treatment Interventions DME instruction;Therapeutic activities;Gait training;Therapeutic exercise;Patient/family education;Balance training;Stair training;Functional mobility training;Neuromuscular re-education    PT Goals (Current goals can be found in the Care Plan section)   Acute Rehab PT Goals Patient Stated Goal: get rehab to get stronger and more independent PT Goal Formulation: With patient Time For Goal Achievement: 05/06/20 Potential to Achieve Goals: Good    Frequency Min 4X/week   Barriers to discharge        Co-evaluation               AM-PAC PT "6 Clicks" Mobility  Outcome Measure Help needed turning from your back to your side while in a flat bed without using bedrails?: A Little Help needed moving from lying on your back to sitting on the side of a flat bed without using bedrails?: A Lot Help needed moving to and from a bed to a chair (including a wheelchair)?: A Lot Help needed standing up from a chair using your arms (e.g., wheelchair or bedside chair)?: A Little Help needed to walk in hospital room?: A Little Help needed climbing 3-5 steps with a railing? : A Lot 6 Click Score: 15    End of Session Equipment Utilized During Treatment: Other (comment);Gait belt Activity Tolerance: Patient tolerated treatment well Patient left: with call bell/phone within reach;in chair;with family/visitor present;Other (comment);with nursing/sitter in room (Pt and sister agree to press call button and wait for RN assist prior to mobilizing) Nurse Communication: Mobility status PT Visit Diagnosis: Other abnormalities of gait and mobility (R26.89);Difficulty in walking, not elsewhere classified (R26.2);Pain Pain - Right/Left:  (mid) Pain - part of body:  (neck)    Time: 6010-9323 PT Time Calculation (min) (ACUTE ONLY): 34 min   Charges:   PT Evaluation $PT Re-evaluation: 1 Re-eval PT Treatments $Gait Training: 8-22 mins  Stacie Glaze, PT Acute Rehabilitation Services Pager [phone]  Office [phone]  Louis Matte 04/22/2020, 10:54 AM

## 2020-04-22 NOTE — Progress Notes (Signed)
Occupational Therapy Re-evaluation Patient Details Name: Leah Franco MRN: [id number] DOB: Sep 10, 1965 Today's Date: 04/22/2020    History of present illness 54 yo female admitted to ED on 12/6 with fall. CT and MRI cervical spine reveal acute mildly displaced type III odontoid fracture, C1 bilateral posterior arch fracture, L L3-4 widening at facet joint, mild edema ALL at dens reveals strain vs disruption. Worsening displacement, s/p Halo collar application on 15/37. 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   OT comments  Pt seen for OT session now s/p halo application. Pt overall requiring minA for room level mobility using RW, requiring up to [place] for toileting ADL at this time. Initiated discussion of compensatory strategies and potential need for using AE to increased safety/independence with ADL and mobility. She will benefit from continued education/review. Pt to benefit from continued acute OT services, currently recommend CIR level therapies to maximize her overall safety and independence with ADL and mobility prior to return home.   Follow Up Recommendations  CIR    Equipment Recommendations  Other (comment);3 in 1 bedside commode (TBD)    Recommendations for Other Services Rehab consult    Precautions / Restrictions Precautions Precautions: Fall Precaution Comments: spinal/cervical precautions; pt with halo Required Braces or Orthoses: Other Brace Cervical Brace: At all times;Other (comment) (halo) Other Brace: Halo collar Restrictions Weight Bearing Restrictions: No       Mobility Bed Mobility Overal bed mobility: Needs Assistance Bed Mobility: Sit to Supine       Sit to supine: Mod assist;HOB elevated   General bed mobility comments: assist for LEs, increased time and cues for safety, transitioned via helicoptor method to ensure body stays in proper alignment while decreasing pressure on  halo collar  Transfers Overall transfer level: Needs assistance Equipment used: Rolling walker (2 wheeled) Transfers: Sit to/from Stand Sit to Stand: Min assist         General transfer comment: to rise and stabilize at RW, increased time/effort    Balance Overall balance assessment: Needs assistance;History of Falls Sitting-balance support: No upper extremity supported;Feet supported Sitting balance-[name] Scale: Good     Standing balance support: During functional activity;Single extremity supported Standing balance-[name] Scale: Poor Standing balance comment: reliant on at least SL support in standing                           ADL either performed or assessed with clinical judgement   ADL Overall ADL's : Needs assistance/impaired Eating/Feeding: Set up;Sitting   Grooming: Set up             Upper Body Dressing Details (indicate cue type and reason): initiated discussion on options for UB clothing given size of halo     Toilet Transfer: Minimal assistance;Ambulation;Grab bars;Comfort height toilet   Toileting- Clothing Manipulation and Hygiene: Total assistance;Sit to/from stand Toileting - Clothing Manipulation Details (indicate cue type and reason): assist for pericare     Functional mobility during ADLs: Minimal assistance;Rolling walker                         Cognition Arousal/Alertness: Awake/alert Behavior During Therapy: WFL for tasks assessed/performed Overall Cognitive Status: Within Functional Limits for tasks assessed  General Comments: Very pleasant, motivated to progress mobility.        Exercises     Shoulder Instructions       General Comments      Pertinent Vitals/ Pain       Pain Assessment: Faces Faces Pain Scale: Hurts even more Pain Location: head, neck Pain Descriptors / Indicators: Sore;Discomfort;Grimacing;Headache Pain Intervention(s): Monitored during  session;Limited activity within patient's tolerance;Patient requesting pain meds-RN notified  Home Living                                          Prior Functioning/Environment              Frequency  Min 2X/week        Progress Toward Goals  OT Goals(current goals can now be found in the care plan section)  Progress towards OT goals: Progressing toward goals  Acute Rehab OT Goals Patient Stated Goal: get rehab to get stronger and more independent OT Goal Formulation: With patient Time For Goal Achievement: 04/30/20 Potential to Achieve Goals: Good ADL Goals Pt Will Perform Grooming: with supervision;standing Pt Will Perform Lower Body Bathing: with supervision;sit to/from stand;with adaptive equipment Pt Will Perform Lower Body Dressing: with supervision;with adaptive equipment;sit to/from stand Pt Will Transfer to Toilet: with supervision;ambulating;regular height toilet;bedside commode;grab bars Pt Will Perform Toileting - Clothing Manipulation and hygiene: with supervision;sit to/from stand;with adaptive equipment Pt Will Perform Tub/Shower Transfer: Tub transfer;with min assist;ambulating;shower seat;rolling walker Additional ADL Goal #1: Pt/caregiver will be independent with donning/doffing cervical collar and changing pads  Plan Discharge plan needs to be updated    Co-evaluation                 AM-PAC OT "6 Clicks" Daily Activity     Outcome Measure   Help from another person eating meals?: None Help from another person taking care of personal grooming?: A Little Help from another person toileting, which includes using toliet, bedpan, or urinal?: Total Help from another person bathing (including washing, rinsing, drying)?: A Lot Help from another person to put on and taking off regular upper body clothing?: A Lot Help from another person to put on and taking off regular lower body clothing?: Total 6 Click Score: 13    End of  Session Equipment Utilized During Treatment: Rolling walker;Other (comment) (halo collar)  OT Visit Diagnosis: Unsteadiness on feet (R26.81);Pain Pain - part of body:  (head/neck/coccyx)   Activity Tolerance Patient tolerated treatment well   Patient Left in bed;with call bell/phone within reach   Nurse Communication Mobility status;Patient requests pain meds        Time: 6803-2122 OT Time Calculation (min): 32 min  Charges: OT General Charges $OT Visit: 1 Visit OT Evaluation $OT Re-eval: 1 Re-eval OT Treatments $Self Care/Home Management : 8-22 mins  Lou Cal, OT Acute Rehabilitation Services Pager [phone] Office [place] 04/22/2020, 6:04 PM

## 2020-04-23 ENCOUNTER — Inpatient Hospital Stay (HOSPITAL_COMMUNITY): Payer: PRIVATE HEALTH INSURANCE

## 2020-04-23 MED ORDER — OXYCODONE HCL 5 MG PO TABS
15.0000 mg | ORAL_TABLET | ORAL | Status: DC
  Administered 2020-04-23 – 2020-05-05 (×73): 15 mg via ORAL
  Filled 2020-04-23 (×73): qty 3

## 2020-04-23 NOTE — Progress Notes (Signed)
   04/23/20 1000  Clinical Encounter Type  Visited With Patient  Visit Type Initial  Referral From Nurse  Consult/Referral To Chaplain  Stress Factors  Patient Stress Factors Health changes   Chaplain responded to consult request. Chaplain provided AD education and answered PT's questions. Chaplain also provided emotional support, and encouragement. Pt stated she wanted her sister to be her 43. Pt is going to discuss and fill it out when her sister visits her this afternoon. Chaplain explained how to reach out for any further questions and when they are ready for a notary. Chaplain remains available as needed.  This note was prepared by Chaplain Resident, Dante Gang, MDiv. Chaplain remains available as needed through the on-call pager: [phone].

## 2020-04-23 NOTE — Progress Notes (Addendum)
Subjective: Patient reports Condition neck pain migraine headache but otherwise doing well feels like the halo is holding her in position she does not feel like her neck is moving within it  Objective: Vital signs in last 24 hours: Temp:  [97.4 F (36.3 C)-98 F (36.7 C)] 97.7 F (36.5 C) (12/15 0734) Pulse Rate:  [80-110] 94 (12/15 0734) Resp:  [20] 20 (12/15 0734) BP: (133-165)/(91-108) 155/107 (12/15 0734) SpO2:  [97 %-100 %] 98 % (12/15 0734)  Intake/Output from previous day: 12/14 0701 - 12/15 0700 In: 360 [P.O.:360] Out: -  Intake/Output this shift: No intake/output data recorded.  He will appears to be well secured in good position neurologically stable  Lab Results: No results for input(s): WBC, HGB, HCT, PLT in the last 72 hours. BMET No results for input(s): NA, K, CL, CO2, GLUCOSE, BUN, CREATININE, CALCIUM in the last 72 hours.  Studies/Results: DG C-Arm 1-60 Min-No Report  Result Date: 04/21/2020 Fluoroscopy was utilized by the requesting physician.  No radiographic interpretation.    Assessment/Plan: Check follow-up lateral C-spine adjust her pain medication continue to work with physical Occupational Therapy and rehab consult appreciated I do think the patient would benefit greatly from inpatient rehab to increase strength to facilitate safety issues navigation and management of halo vest  LOS: 8 days     Elaina Hoops 04/23/2020, 8:22 AM

## 2020-04-23 NOTE — Progress Notes (Signed)
Physical Therapy Treatment Patient Details Name: Leah Franco MRN: [id number] DOB: Jul 25, 1965 Today's Date: 04/23/2020    History of Present Illness 54 yo female admitted to ED on 12/6 with fall. CT and MRI cervical spine reveal acute mildly displaced type III odontoid fracture, C1 bilateral posterior arch fracture, L L3-4 widening at facet joint, mild edema ALL at dens reveals strain vs disruption. Worsening displacement, s/p Halo collar application on 86/76. 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    PT Comments    Pt reporting all-day migraine headache and neck/back pain, but states it is improving with pain medications. Pt also reports "popping" sounds coming from neck earlier in the day, but states they were painless. Pt ambulatory in room with room dimly lit to accommodate pt migraine. Pt continues to require min-mod assist for mobility at this time, but doing well. Will continue to follow acutely.     Follow Up Recommendations  CIR     Equipment Recommendations  Rolling walker with 5" wheels;3in1 (PT);Hospital bed    Recommendations for Other Services Rehab consult     Precautions / Restrictions Precautions Precautions: Fall Precaution Comments: spinal/cervical precautions; pt with halo Required Braces or Orthoses: Other Brace Cervical Brace: At all times;Other (comment) Other Brace: Halo collar    Mobility  Bed Mobility Overal bed mobility: Needs Assistance Bed Mobility: Rolling;Sidelying to Sit Rolling: Min assist Sidelying to sit: Min assist       General bed mobility comments: min assist for trunk elevation via HHA, Increased time and effort but good maintenance of spinal precautions.  Transfers Overall transfer level: Needs assistance Equipment used: Rolling walker (2 wheeled) Transfers: Sit to/from Stand Sit to Stand: Min assist         General transfer comment: min assist for  power up, steadying. VCs for hand placement when rising and sitting  Ambulation/Gait Ambulation/Gait assistance: Min guard Gait Distance (Feet): 45 Feet (+10) Assistive device: Rolling walker (2 wheeled) Gait Pattern/deviations: Step-through pattern;Decreased stride length;Decreased dorsiflexion - left Gait velocity: decr   General Gait Details: min guard for safety, verbal cuing for avoiding twisting when changing directions, upright posture.   Stairs             Wheelchair Mobility    Modified Rankin (Stroke Patients Only)       Balance Overall balance assessment: Needs assistance;History of Falls Sitting-balance support: No upper extremity supported;Feet supported Sitting balance-[name] Scale: Good     Standing balance support: During functional activity;Single extremity supported Standing balance-[name] Scale: Poor Standing balance comment: reliant on external support                            Cognition Arousal/Alertness: Awake/alert Behavior During Therapy: WFL for tasks assessed/performed Overall Cognitive Status: Within Functional Limits for tasks assessed                                 General Comments: Very pleasant, motivated to progress mobility.      Exercises      General Comments        Pertinent Vitals/Pain Pain Assessment: Faces Faces Pain Scale: Hurts even more Pain Location: head, neck Pain Descriptors / Indicators: Sore;Discomfort;Grimacing;Headache Pain Intervention(s): Limited activity within patient's tolerance;Monitored during session;Repositioned;Premedicated before session    Home Living  Prior Function            PT Goals (current goals can now be found in the care plan section) Acute Rehab PT Goals Patient Stated Goal: get rehab to get stronger and more independent PT Goal Formulation: With patient Time For Goal Achievement: 05/06/20 Potential to Achieve Goals:  Good Progress towards PT goals: Progressing toward goals    Frequency    Min 4X/week      PT Plan Current plan remains appropriate    Co-evaluation              AM-PAC PT "6 Clicks" Mobility   Outcome Measure  Help needed turning from your back to your side while in a flat bed without using bedrails?: A Little Help needed moving from lying on your back to sitting on the side of a flat bed without using bedrails?: A Little Help needed moving to and from a bed to a chair (including a wheelchair)?: A Little Help needed standing up from a chair using your arms (e.g., wheelchair or bedside chair)?: A Little Help needed to walk in hospital room?: A Little Help needed climbing 3-5 steps with a railing? : A Lot 6 Click Score: 17    End of Session Equipment Utilized During Treatment: Other (comment);Gait belt (halo) Activity Tolerance: Patient tolerated treatment well Patient left: with call bell/phone within reach;in chair;Other (comment) (Pt and sister agree to press call button and wait for RN assist prior to mobilizing) Nurse Communication: Mobility status PT Visit Diagnosis: Other abnormalities of gait and mobility (R26.89);Difficulty in walking, not elsewhere classified (R26.2);Pain Pain - Right/Left:  (mid) Pain - part of body:  (neck)     Time: 5974-1638 PT Time Calculation (min) (ACUTE ONLY): 37 min  Charges:  $Gait Training: 8-22 mins $Therapeutic Activity: 8-22 mins                     Stacie Glaze, PT Acute Rehabilitation Services Pager [phone]  Office [phone]    Leah Franco 04/23/2020, 5:23 PM

## 2020-04-23 NOTE — Progress Notes (Addendum)
  I met at bedside with patient to discuss goals and expectations of a possible Cir admit. She prefers admit to Cir before d/c home. I will begin insurance authorization for a possible Cir admit pending their approval.  Danne Baxter, RN, MSN Rehab Admissions Coordinator [phone] 04/23/2020 12:50 PM   Patient works for [employer] and does no have out of network benefits. [employer] will not let us pursue CIR admit at [facility], they request we refer her to [facility]. I met with patient at bedside and she is aware. I have alerted acute team and TOC.  Danne Baxter, RN, MSN Rehab Admissions Coordinator [phone] 04/23/2020 1:49 PM

## 2020-04-23 NOTE — TOC Initial Note (Signed)
Transition of Care (TOC) - Initial/Assessment Note  Marvetta Gibbons RN,BSN Transitions of Care Unit 4NP (non trauma) - RN Case Manager See Treatment Team for direct Phone #   Patient Details  Name: Leah Franco MRN: [id number] Date of Birth: 01/19/1966  Transition of Care [facility]) CM/SW Contact:    Dawayne Patricia, RN Phone Number: 04/23/2020, 4:01 PM  Clinical Narrative:                 Pt from home, s/p fall with cervical fx, now with Halo stablization. CIR consulted- however pt is OON with [facility] IN[facility] rehab- and will need to use an [place] rehab. Spoke with pt at bedside- she voiced understanding of needing to use a [facility] discussed options for [facility]) or [facility]. Pt would like referrals sent to both facilities to review and she will plan to speak with family regarding options. Pt will need EMS transport to whichever facility she chooses.  Calls made to both facilities and info faxed to both- [facility]- [facility])- [phone] (fax- [phone]) [facility])- [phone] (fax [phone])  Await for review of clinicals from both and will f/u with patient on bed availability and options.   Expected Discharge Plan: IP Rehab Facility Barriers to Discharge: Continued Medical Work up   Patient Goals and CMS Choice Patient states their goals for this hospitalization and ongoing recovery are:: rehab CMS Medicare.gov Compare Post Acute Care list provided to:: Patient Choice offered to / list presented to : Patient  Expected Discharge Plan and Services Expected Discharge Plan: [place] In-house Referral: Clinical Social Work Discharge Planning Services: CM Consult Post Acute Care Choice: IP Rehab Living arrangements for the past 2 months: Single Family Home                                      Prior Living Arrangements/Services Living arrangements for the past 2 months: Single Family  Home Lives with:: Self Patient language and need for interpreter reviewed:: Yes Do you feel safe going back to the place where you live?: Yes      Need for Family Participation in Patient Care: Yes (Comment) Care giver support system in place?: Yes (comment) Current home services: DME Criminal Activity/Legal Involvement Pertinent to Current Situation/Hospitalization: No - Comment as needed  Activities of Daily Living Home Assistive Devices/Equipment: Cane (specify quad or straight),CBG Meter,Eyeglasses,Shower chair with back,Walker (specify type) ADL Screening (condition at time of admission) Patient's cognitive ability adequate to safely complete daily activities?: Yes Is the patient deaf or have difficulty hearing?: Yes Does the patient have difficulty seeing, even when wearing glasses/contacts?: Yes Does the patient have difficulty concentrating, remembering, or making decisions?: No Patient able to express need for assistance with ADLs?: Yes Does the patient have difficulty dressing or bathing?: Yes Independently performs ADLs?: Yes (appropriate for developmental age) Does the patient have difficulty walking or climbing stairs?: Yes Weakness of Legs: Left Weakness of Arms/Hands: Left  Permission Sought/Granted Permission sought to share information with : Facility Art therapist granted to share information with : Yes, Verbal Permission Granted  Share Information with NAME: Assunta Found  Permission granted to share info w AGENCY: INPT rehab-[place] granted to share info w Relationship: sister  Permission granted to share info w Contact Information: [phone]  Emotional Assessment Appearance:: Appears stated  age Attitude/Demeanor/Rapport: Engaged Affect (typically observed): Appropriate,Accepting Orientation: : Oriented to Self,Oriented to Place,Oriented to  Time,Oriented to Situation Alcohol / Substance Use: Not Applicable Psych  Involvement: No (comment)  Admission diagnosis:  Cervical spine fracture ([place]) [S12.9XXA] Fall from slip, trip, or stumble, initial encounter [W01.0XXA] Fall, initial encounter [id number]] Laceration of forehead, initial encounter [S01.81XA] Closed fracture of spinous process of cervical vertebra, initial encounter ([place]) [S12.9XXA] Closed nondisplaced fracture of first cervical vertebra, unspecified fracture morphology, initial encounter ([place]) [S12.001A] Closed displaced fracture of fifth cervical vertebra, unspecified fracture morphology, initial encounter ([place]) [S12.400A] Closed odontoid fracture with type III morphology, initial encounter [facility]) [S12.120A] Patient Active Problem List   Diagnosis Date Noted  . Cervical spine fracture ([place]) 04/14/2020  . GIB (gastrointestinal bleeding) 03/12/2020  . Arterial insufficiency ([place]) 11/25/2010  . MRSA (methicillin resistant Staphylococcus aureus) 11/25/2010  . COPD (chronic obstructive pulmonary disease) ([place]) 11/25/2010  . CHF (congestive heart failure) ([place]) 11/25/2010  . Spinal cord injury 11/25/2010  . CAD (coronary artery disease) 11/25/2010  . CVA (cerebral infarction) 11/25/2010  . TIA (transient ischemic attack) 11/25/2010  . Diabetes mellitus 11/25/2010  . Neuropathy 11/25/2010  . End stage renal disease ([place]) 11/25/2010  . Dialysis care 11/25/2010  . Lupus ([place]) 11/25/2010  . Lymphedema 11/25/2010  . Scoliosis 11/25/2010  . Abscess 11/25/2010   PCP:  [name], MD Pharmacy:   [place], [place] [address] Phone: [phone] Fax: [phone]  [id number] - [place], [place] - [address] AT [place] [address] [place] [address] Phone: [phone] Fax: [phone]     Social Determinants of Health (SDOH) Interventions    Readmission Risk Interventions No  flowsheet data found.

## 2020-04-24 NOTE — Progress Notes (Signed)
Occupational Therapy Treatment Patient Details Name: Leah Franco MRN: [id number] DOB: 11/23/1965 Today's Date: 04/24/2020    History of present illness 54 yo female admitted to ED on 12/6 with fall. CT and MRI cervical spine reveal acute mildly displaced type III odontoid fracture, C1 bilateral posterior arch fracture, L L3-4 widening at facet joint, mild edema ALL at dens reveals strain vs disruption. Worsening displacement, s/p Halo collar application on 10/27. 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   OT comments  Pt seated in recliner upon arrival with pt requesting to complete sink level bath. Pt required up to MOD A for bathing tasks at sink in order to maintain precautions. Pt with good awareness into precautions asking appropriate questions. Pt required up to MIN A for functional mobility with RW and min guard for sit<>stand with good eccentric control when descending into chair. Pt would continue to benefit from skilled occupational therapy while admitted and after d/c to address the below listed limitations in order to improve overall functional mobility and facilitate independence with BADL participation. DC plan remains appropriate, will follow acutely per POC.    Follow Up Recommendations  CIR    Equipment Recommendations  Other (comment);3 in 1 bedside commode (TBD)    Recommendations for Other Services      Precautions / Restrictions Precautions Precautions: Fall;Cervical Precaution Booklet Issued: Yes (comment) Precaution Comments: spinal/cervical precautions; pt with halo Required Braces or Orthoses: Other Brace Cervical Brace: At all times;Other (comment) Other Brace: Halo collar Restrictions Weight Bearing Restrictions: No       Mobility Bed Mobility               General bed mobility comments: OOB in recliner  Transfers Overall transfer level: Needs assistance Equipment used:  Rolling walker (2 wheeled) Transfers: Sit to/from Stand Sit to Stand: Min assist;Min guard         General transfer comment: minA progressing to minG with cues. pt able to power up from recliner without physical assist and lower safely    Balance Overall balance assessment: Needs assistance;History of Falls Sitting-balance support: No upper extremity supported;Feet supported Sitting balance-[name] Scale: Good     Standing balance support: Single extremity supported;During functional activity Standing balance-[name] Scale: Poor Standing balance comment: at least one UE supported for grooming tasks                           ADL either performed or assessed with clinical judgement   ADL Overall ADL's : Needs assistance/impaired         Upper Body Bathing: Set up;Sitting Upper Body Bathing Details (indicate cue type and reason): sitting at sink Lower Body Bathing: Sit to/from stand;Moderate assistance Lower Body Bathing Details (indicate cue type and reason): for buttocks in standing, Upper Body Dressing : Minimal assistance;Sitting Upper Body Dressing Details (indicate cue type and reason): to don gown Lower Body Dressing: Total assistance Lower Body Dressing Details (indicate cue type and reason): pt reports using sock aid at baseline Toilet Transfer: Minimal assistance;Ambulation;Grab bars;Comfort height toilet;RW   Toileting- Clothing Manipulation and Hygiene: Sitting/lateral lean;Supervision/safety;Set up       Functional mobility during ADLs: Minimal assistance;Rolling walker General ADL Comments: pt complete full wash at sink with up to MOD A for LB bathing     Vision       Perception     Praxis  Cognition Arousal/Alertness: Awake/alert Behavior During Therapy: WFL for tasks assessed/performed Overall Cognitive Status: Within Functional Limits for tasks assessed                                 General Comments: Very pleasant,  motivated to progress mobility.        Exercises Exercises: General Lower Extremity General Exercises - Lower Extremity Long Arc Quad: AROM;Strengthening;Both;15 reps;Seated Heel Slides: AROM;Right;15 reps;Seated Heel Raises: Strengthening;Both;15 reps;Seated   Shoulder Instructions       General Comments VSS on RA, pt with no reports of change in pain    Pertinent Vitals/ Pain       Pain Assessment: 0-10 Pain Score: 7  Pain Location: head, neck Pain Descriptors / Indicators: Sore;Discomfort;Grimacing;Headache Pain Intervention(s): Limited activity within patient's tolerance;Monitored during session;Repositioned  Home Living                                          Prior Functioning/Environment              Frequency  Min 2X/week        Progress Toward Goals  OT Goals(current goals can now be found in the care plan section)  Progress towards OT goals: Progressing toward goals  Acute Rehab OT Goals Patient Stated Goal: get rehab to get stronger and more independent OT Goal Formulation: With patient Time For Goal Achievement: 04/30/20 Potential to Achieve Goals: Good  Plan Discharge plan remains appropriate;Frequency remains appropriate    Co-evaluation                 AM-PAC OT "6 Clicks" Daily Activity     Outcome Measure   Help from another person eating meals?: None Help from another person taking care of personal grooming?: A Little Help from another person toileting, which includes using toliet, bedpan, or urinal?: A Little Help from another person bathing (including washing, rinsing, drying)?: A Little Help from another person to put on and taking off regular upper body clothing?: A Little Help from another person to put on and taking off regular lower body clothing?: A Lot 6 Click Score: 18    End of Session Equipment Utilized During Treatment: Rolling walker;Other (comment) (halo collar)  OT Visit Diagnosis:  Unsteadiness on feet (R26.81);Pain   Activity Tolerance Patient tolerated treatment well   Patient Left in chair;with call bell/phone within reach   Nurse Communication Mobility status        Time: 9935-7017 OT Time Calculation (min): 52 min  Charges: OT General Charges $OT Visit: 1 Visit OT Treatments $Self Care/Home Management : 38-52 mins  Lanier Clam., COTA/L Acute Rehabilitation Services [phone]    Ihor Gully 04/24/2020, 4:22 PM

## 2020-04-24 NOTE — Progress Notes (Signed)
Nutrition Follow-up  DOCUMENTATION CODES:   Obesity unspecified  INTERVENTION:  Continue Ensure Enlive po BID, each supplement provides 350 kcal and 20 grams of protein  Encourage adequate PO intake.   NUTRITION DIAGNOSIS:   Increased nutrient needs related to acute illness as evidenced by estimated needs; ongoing  GOAL:   Patient will meet greater than or equal to 90% of their needs; progressing  MONITOR:   PO intake,Supplement acceptance,Skin,Weight trends,Labs,I & O's  REASON FOR ASSESSMENT:   Malnutrition Screening Tool    ASSESSMENT:   54 year old female with history of DM, neuropathy and foot drop in her left leg presents after fall. CT with odontoid fx, spinal process fx/facet fx  12/12- s/p Procedure: Halo application for stabilization of unstable odontoid fracture with fluoroscopic assistance   Meal completion has been 50-80%. Pt has been tolerating her po diet. Pt currently has Ensure ordered and has been consuming them. RD to continue with current orders to aid in caloric and protein needs. Pt encouraged to eat her food at meals and to drink her supplements.   Labs and medications reviewed.  Diet Order:   Diet Order            Diet Carb Modified Fluid consistency: Thin; Room service appropriate? Yes  Diet effective now                 EDUCATION NEEDS:   Not appropriate for education at this time  Skin:  Skin Assessment: Reviewed RN Assessment Skin Integrity Issues:: Other (Comment),Incisions Incisions: closed head Other: upper face laceration with staples  Last BM:  12/14  Height:   Ht Readings from Last 1 Encounters:  04/21/20 5' 2.99" (1.6 m)    Weight:   Wt Readings from Last 1 Encounters:  04/21/20 85.7 kg   BMI:  Body mass index is 33.49 kg/m.  Estimated Nutritional Needs:   Kcal:  1800-1950  Protein:  85-100 grams  Fluid:  >/= 1.8 L/day  Corrin Parker, MS, RD, LDN RD pager number/after hours weekend pager number on  Amion.

## 2020-04-24 NOTE — TOC Progression Note (Addendum)
Transition of Care (TOC) - Progression Note  Leah Gibbons RN,BSN Transitions of Care Unit 4NP (non trauma) - RN Case Manager See Treatment Team for direct Phone #   Patient Details  Name: Leah Franco MRN: [id number] Date of Birth: Jun 08, 1965  Transition of Care [facility]) CM/SW Contact  [name] Romeo Rabon, RN Phone Number: 04/24/2020, 12:31 PM  Clinical Narrative:    Received return call from [facility]- at this time barrier to [facility] re[facility] admission is IV pain meds- pt would need to have pain managed on po meds before they would look at offering a bed.  They will re-eval when pt is off IV pain meds.  [place] INPT rehab does not have any bed availability at this time.   Patient updated, per pt she states her preference if for the [facility] center rehab-  TOC to continue to follow.    Expected Discharge Plan: IP Rehab Facility Barriers to Discharge: Continued Medical Work up  Expected Discharge Plan and Services Expected Discharge Plan: [facility] In-house Referral: Clinical Social Work Discharge Planning Services: CM Consult Post Acute Care Choice: IP Rehab Living arrangements for the past 2 months: Single Family Home                                       Social Determinants of Health (SDOH) Interventions    Readmission Risk Interventions No flowsheet data found.

## 2020-04-24 NOTE — Progress Notes (Signed)
Physical Therapy Treatment Patient Details Name: Leah Franco MRN: [id number] DOB: Feb 04, 1966 Today's Date: 04/24/2020    History of Present Illness 54 yo female admitted to ED on 12/6 with fall. CT and MRI cervical spine reveal acute mildly displaced type III odontoid fracture, C1 bilateral posterior arch fracture, L L3-4 widening at facet joint, mild edema ALL at dens reveals strain vs disruption. Worsening displacement, s/p Halo collar application on 35/57. 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    PT Comments    The pt presents today with significant improvements in mobility, stability, and activity tolerance. She was able to perform sit-stand transfers with minG for verbal cues but no physical assist, as well as ambulate 300 ft in the hallway to include speed intervals with use of RW and no evidence of instability. The pt remains limited by poor pain control at this time, but was able to make good progress with mobility and all activities practiced during today's session. The pt will continue to benefit from significant skilled PT to progress functional endurance, stability, and capacity to manage or direct care to allow for safe d/c home. At this time, recommend switching d/c plan to home with HHPT and assist of sister as needed (states 24/7 supervision and assist from sister at home).    Follow Up Recommendations  Home health PT;Supervision/Assistance - 24 hour     Equipment Recommendations  Rolling walker with 5" wheels;3in1 (PT);Hospital bed    Recommendations for Other Services       Precautions / Restrictions Precautions Precautions: Fall;Cervical Precaution Comments: spinal/cervical precautions; pt with halo Required Braces or Orthoses: Other Brace Cervical Brace: At all times;Other (comment) Other Brace: Halo collar Restrictions Weight Bearing Restrictions: No    Mobility  Bed Mobility                General bed mobility comments: pt OOB in recliner at start and end of session  Transfers Overall transfer level: Needs assistance Equipment used: Rolling walker (2 wheeled) Transfers: Sit to/from Stand Sit to Stand: Min assist;Min guard         General transfer comment: minA progressing to minG with cues. pt able to power up from recliner without physical assist and lower safely  Ambulation/Gait Ambulation/Gait assistance: Min guard Gait Distance (Feet): 300 Feet Assistive device: Rolling walker (2 wheeled) Gait Pattern/deviations: Step-through pattern;Decreased stride length Gait velocity: 0.2 m/s (normal) 0.67 m/s (fast) Gait velocity interpretation: <1.31 ft/sec, indicative of household ambulator General Gait Details: minG for safety, pt with no evidence of instability at this time, able to compelte multiple "speed" intervals at end of walking session       Balance Overall balance assessment: Needs assistance;History of Falls Sitting-balance support: No upper extremity supported;Feet supported Sitting balance-[name] Scale: Good     Standing balance support: Bilateral upper extremity supported Standing balance-[name] Scale: Fair Standing balance comment: static stance without UE support, gait with BUE support on RW                            Cognition Arousal/Alertness: Awake/alert Behavior During Therapy: WFL for tasks assessed/performed Overall Cognitive Status: Within Functional Limits for tasks assessed                                 General Comments: Very pleasant,  motivated to progress mobility.      Exercises General Exercises - Lower Extremity Long Arc Quad: AROM;Strengthening;Both;15 reps;Seated Heel Slides: AROM;Right;15 reps;Seated Heel Raises: Strengthening;Both;15 reps;Seated    General Comments General comments (skin integrity, edema, etc.): VSS on RA, pt with no reports of change in pain      Pertinent  Vitals/Pain Pain Assessment: 0-10 Pain Score: 8  Pain Location: head, neck Pain Descriptors / Indicators: Sore;Discomfort;Grimacing;Headache Pain Intervention(s): Limited activity within patient's tolerance;Monitored during session;Repositioned           PT Goals (current goals can now be found in the care plan section) Acute Rehab PT Goals Patient Stated Goal: get rehab to get stronger and more independent PT Goal Formulation: With patient Time For Goal Achievement: 05/06/20 Potential to Achieve Goals: Good Progress towards PT goals: Progressing toward goals    Frequency    Min 4X/week      PT Plan Discharge plan needs to be updated       AM-PAC PT "6 Clicks" Mobility   Outcome Measure  Help needed turning from your back to your side while in a flat bed without using bedrails?: A Little Help needed moving from lying on your back to sitting on the side of a flat bed without using bedrails?: A Little Help needed moving to and from a bed to a chair (including a wheelchair)?: A Little Help needed standing up from a chair using your arms (e.g., wheelchair or bedside chair)?: A Little Help needed to walk in hospital room?: A Little Help needed climbing 3-5 steps with a railing? : A Little 6 Click Score: 18    End of Session Equipment Utilized During Treatment: Other (comment);Gait belt (halo) Activity Tolerance: Patient tolerated treatment well Patient left: with call bell/phone within reach;in chair;Other (comment) Nurse Communication: Mobility status PT Visit Diagnosis: Other abnormalities of gait and mobility (R26.89);Difficulty in walking, not elsewhere classified (R26.2);Pain Pain - part of body:  (neck)     Time: 5465-6812 PT Time Calculation (min) (ACUTE ONLY): 39 min  Charges:  $Gait Training: 23-37 mins $Therapeutic Exercise: 8-22 mins                     Karma Ganja, PT, DPT   Acute Rehabilitation Department Pager #: [phone]   Otho Bellows 04/24/2020, 3:50 PM

## 2020-04-25 MED ORDER — CYCLOBENZAPRINE HCL 10 MG PO TABS
10.0000 mg | ORAL_TABLET | Freq: Three times a day (TID) | ORAL | Status: DC | PRN
  Administered 2020-04-25 – 2020-05-05 (×22): 10 mg via ORAL
  Filled 2020-04-25 (×26): qty 1

## 2020-04-25 MED ORDER — BACITRACIN ZINC 500 UNIT/GM EX OINT
TOPICAL_OINTMENT | Freq: Two times a day (BID) | CUTANEOUS | Status: DC
  Administered 2020-04-25 – 2020-04-30 (×5): 1 via TOPICAL
  Filled 2020-04-25 (×2): qty 28.4

## 2020-04-25 NOTE — Progress Notes (Signed)
Physical Therapy Treatment Patient Details Name: Leah Franco MRN: [id number] DOB: 1965-09-25 Today's Date: 04/25/2020    History of Present Illness 54 yo female admitted to ED on 12/6 with fall. CT and MRI cervical spine reveal acute mildly displaced type III odontoid fracture, C1 bilateral posterior arch fracture, L L3-4 widening at facet joint, mild edema ALL at dens reveals strain vs disruption. Worsening displacement, s/p Halo collar application on 19/14. 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    PT Comments    The pt was able to make good progress with today's therapy session, but continues to endorse goal of going to rehab due to concerns with dynamic stability and halo management. The pt was able to complete multiple sit-stands during this session with minG to minA for stability and safety at this time, and also required increased assist for walking at this time. The pt was able to complete higher level balance activities, but required minA in addition to BUE support to complete safely due to deficits in strength, coordination, LLE DF, and stability. The pt will continue to benefit from skilled PT acutely and following d/c to maximize safety and independence with mobility prior to return home.     Follow Up Recommendations  CIR     Equipment Recommendations  Rolling walker with 5" wheels;3in1 (PT);Hospital bed    Recommendations for Other Services       Precautions / Restrictions Precautions Precautions: Fall;Cervical Precaution Booklet Issued: Yes (comment) Precaution Comments: spinal/cervical precautions; pt with halo Required Braces or Orthoses: Other Brace Cervical Brace: At all times;Other (comment) Other Brace: Halo collar Restrictions Weight Bearing Restrictions: No    Mobility  Bed Mobility Overal bed mobility: Needs Assistance Bed Mobility: Sit to Supine       Sit to supine: Min  assist;HOB elevated   General bed mobility comments: modA to reposition and complete alignment of pt in bed, needs assist to move pillows/support with halo  Transfers Overall transfer level: Needs assistance Equipment used: Rolling walker (2 wheeled) Transfers: Sit to/from Stand Sit to Stand: Min assist;Min guard         General transfer comment: minG at times, but minA with fatigue to steady in standing  Ambulation/Gait Ambulation/Gait assistance: Min guard;Min assist Gait Distance (Feet): 30 Feet (x2) Assistive device: Rolling walker (2 wheeled) Gait Pattern/deviations: Step-through pattern;Decreased stride length;Decreased dorsiflexion - left;Shuffle     General Gait Details: slow, able to steady with RW, decreased DF with LLE, but pt able to consistently clear toes without physical assist. slowed gait with fatigue.       Balance Overall balance assessment: Needs assistance;History of Falls Sitting-balance support: No upper extremity supported;Feet supported Sitting balance-[name] Scale: Good Sitting balance - Comments: able to sit EOB without PT support   Standing balance support: Single extremity supported;During functional activity Standing balance-[name] Scale: Poor Standing balance comment: at least one UE supported for grooming tasks             High level balance activites: Backward walking (tandem walking) High Level Balance Comments: increased need for assist, even with BUE support. pt with good safety awareness but increased need for assist            Cognition Arousal/Alertness: Awake/alert Behavior During Therapy: [facility] for tasks assessed/performed Overall Cognitive Status: Within Functional Limits for tasks assessed  General Comments: Very pleasant, motivated to progress mobility.      Exercises General Exercises - Lower Extremity Heel Slides: AROM;Both;10 reps;Standing Hip Flexion/Marching:  AROM;Both;10 reps;Standing    General Comments General comments (skin integrity, edema, etc.): VSS on RA      Pertinent Vitals/Pain Pain Assessment: Faces Faces Pain Scale: Hurts even more Pain Location: head, neck Pain Descriptors / Indicators: Sore;Discomfort;Grimacing;Headache Pain Intervention(s): Limited activity within patient's tolerance;Monitored during session;Repositioned;Patient requesting pain meds-RN notified           PT Goals (current goals can now be found in the care plan section) Acute Rehab PT Goals Patient Stated Goal: get rehab to get stronger and more independent PT Goal Formulation: With patient Time For Goal Achievement: 05/06/20 Potential to Achieve Goals: Good Progress towards PT goals: Progressing toward goals    Frequency    Min 4X/week      PT Plan Discharge plan needs to be updated       AM-PAC PT "6 Clicks" Mobility   Outcome Measure  Help needed turning from your back to your side while in a flat bed without using bedrails?: A Little Help needed moving from lying on your back to sitting on the side of a flat bed without using bedrails?: A Little Help needed moving to and from a bed to a chair (including a wheelchair)?: A Little Help needed standing up from a chair using your arms (e.g., wheelchair or bedside chair)?: A Little Help needed to walk in hospital room?: A Little Help needed climbing 3-5 steps with a railing? : A Lot 6 Click Score: 17    End of Session Equipment Utilized During Treatment: Other (comment);Gait belt (halo) Activity Tolerance: Patient tolerated treatment well Patient left: with call bell/phone within reach;Other (comment);in bed Nurse Communication: Mobility status PT Visit Diagnosis: Other abnormalities of gait and mobility (R26.89);Difficulty in walking, not elsewhere classified (R26.2);Pain Pain - part of body:  (neck)     Time: 1308-6578 PT Time Calculation (min) (ACUTE ONLY): 56 min  Charges:   $Gait Training: 8-22 mins $Therapeutic Exercise: 8-22 mins $Therapeutic Activity: 23-37 mins                     Karma Ganja, PT, DPT   Acute Rehabilitation Department Pager #: [phone]   Otho Bellows 04/25/2020, 4:37 PM

## 2020-04-25 NOTE — TOC Progression Note (Signed)
Transition of Care (TOC) - Progression Note  Marvetta Gibbons RN,BSN Transitions of Care Unit 4NP (non trauma) - RN Case Manager See Treatment Team for direct Phone #   Patient Details  Name: Leah Franco MRN: [id number] Date of Birth: [birth year]  Transition of Care [facility]) CM/SW Contact  [name], Romeo Rabon, RN Phone Number: 04/25/2020, 2:18 PM  Clinical Narrative:    Damaris Schooner with Nevin Bloodgood at [facility]- she will f/u to see how pt does with PT/OT off IV pain meds to see how pt tolerates therapy on po pain management.    Expected Discharge Plan: IP Rehab Facility Barriers to Discharge: Continued Medical Work up  Expected Discharge Plan and Services Expected Discharge Plan: [place] In-house Referral: Clinical Social Work Discharge Planning Services: CM Consult Post Acute Care Choice: IP Rehab Living arrangements for the past 2 months: Single Family Home                                       Social Determinants of Health (SDOH) Interventions    Readmission Risk Interventions No flowsheet data found.

## 2020-04-25 NOTE — Progress Notes (Signed)
Subjective: Patient reports doing ok but had a rough night of pain.  Objective: Vital signs in last 24 hours: Temp:  [97.6 F (36.4 C)-98.6 F (37 C)] 97.8 F (36.6 C) (12/17 0308) Pulse Rate:  [80-109] 88 (12/17 0308) Resp:  [19-20] 20 (12/16 1543) BP: (120-140)/(88-104) 130/96 (12/17 0308) SpO2:  [96 %-100 %] 99 % (12/17 0308)  Intake/Output from previous day: 12/16 0701 - 12/17 0700 In: 360 [P.O.:360] Out: -  Intake/Output this shift: No intake/output data recorded.  Neurologic: Grossly normal  Lab Results: Lab Results  Component Value Date   WBC 9.7 04/14/2020   HGB 11.2 (L) 04/14/2020   HCT 37.1 04/14/2020   MCV 103.3 (H) 04/14/2020   PLT 432 (H) 04/14/2020   Lab Results  Component Value Date   INR 1.0 03/12/2020   BMET Lab Results  Component Value Date   NA 139 04/14/2020   K 3.7 04/14/2020   CL 102 04/14/2020   CO2 25 04/14/2020   GLUCOSE 115 (H) 04/14/2020   BUN 15 04/14/2020   CREATININE 0.51 04/14/2020   CALCIUM 8.1 (L) 04/14/2020    Studies/Results: DG Cervical Spine 1 View  Result Date: 04/23/2020 CLINICAL DATA:  History of cervical spine fracture EXAM: DG CERVICAL SPINE - 1 VIEW COMPARISON:  Plain film from 04/17/2020 as well as prior CT from 04/14/2020 FINDINGS: There again noted fractures involving the C1 ring as well as the odontoid similar to that seen on prior CT examination. The previously seen displacement of the odontoid has been reduced. The C5 laminar fractures are not as well appreciated on today's exam. Prior chronic spinous process fractures at C6 and C7 are noted. Displacement of the C5 spinous process is noted. IMPRESSION: Interval reduction of the odontoid fracture when compared with the prior plain film. Fractures involving C1-C2 and C5 are seen. Chronic fractures involving C6 and C7 are noted Electronically Signed   By: Inez Catalina M.D.   On: 04/23/2020 11:45    Assessment/Plan: Postop day 4 halo placement. Awaiting placement  for SNF. Continue therapy today.    LOS: 10 days    Ocie Cornfield [facility] 04/25/2020, 7:49 AM

## 2020-04-26 NOTE — Progress Notes (Addendum)
Patient home medication 1 pen of Ozempic and 4 pens of Enbrel taken to pharmacy. Medications are currently weekly every Thurs.

## 2020-04-26 NOTE — Progress Notes (Signed)
Overall doing well.  Patient tolerating the halo.  Participating in therapy and making progress.  Afebrile.  Vital signs are stable.  She is awake and alert.  She is oriented and appropriate.  Motor and sensory function intact.  Halo pin sites clear.  Halo vest is secure.  Overall progressing well.  Continue halo mobilization for type III odontoid fracture.  Mobilize with therapy.  Continue efforts at placement.

## 2020-04-27 MED ORDER — ADULT MULTIVITAMIN W/MINERALS CH
1.0000 | ORAL_TABLET | Freq: Every day | ORAL | Status: DC
  Administered 2020-04-27 – 2020-05-05 (×9): 1 via ORAL
  Filled 2020-04-27 (×9): qty 1

## 2020-04-27 MED ORDER — HEPARIN SODIUM (PORCINE) 5000 UNIT/ML IJ SOLN
5000.0000 [IU] | Freq: Three times a day (TID) | INTRAMUSCULAR | Status: DC
  Administered 2020-04-27 – 2020-05-05 (×24): 5000 [IU] via SUBCUTANEOUS
  Filled 2020-04-27 (×24): qty 1

## 2020-04-27 NOTE — Progress Notes (Signed)
Neurosurgery Service Progress Note  Subjective: No acute events overnight   Objective: Vitals:   04/27/20 0005 04/27/20 0500 04/27/20 0715 04/27/20 1202  BP: 131/71 129/77 (!) 131/96 (!) 129/96  Pulse: 81 78 93 85  Resp: 16 18 18 18   Temp: 98.1 F (36.7 C) 98.1 F (36.7 C) 98.2 F (36.8 C) 98.2 F (36.8 C)  TempSrc: Axillary Axillary Oral Oral  SpO2: 97% 98% 99% 96%  Weight:      Height:        Physical Exam: Sitting upright in bed with halo in good position, FCx4 without preference  Assessment & Plan: 54 y.o. woman s/p halo for C2 frx, recovering well.  -rehab placement pending -cont PT/OT -SCDs/TEDs/SQH -macrocytic anemia on admission, will start multivitamin  Judith Part  04/27/20 12:44 PM

## 2020-04-28 NOTE — Progress Notes (Signed)
Occupational Therapy Treatment Patient Details Name: Leah Franco MRN: [id number] DOB: 25-Dec-1965 Today's Date: 04/28/2020    History of present illness 54 yo female admitted to ED on 12/6 with fall. CT and MRI cervical spine reveal acute mildly displaced type III odontoid fracture, C1 bilateral posterior arch fracture, L L3-4 widening at facet joint, mild edema ALL at dens reveals strain vs disruption. Worsening displacement, s/p Halo collar application on 02/63. 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   OT comments  This 54 yo female seen today to focus on ADLs, she is making good progress. She reports she is very familiar with using of AE for LBADLs from prior back surgeries. She is currently at a min A-min guard A for LBD, toileting, grooming. She will continue to benefit from acute OT with follow up on CIR to get to a Mod I to independent level for all basic ADLs and some IADLs.  Follow Up Recommendations  CIR    Equipment Recommendations  3 in 1 bedside commode       Precautions / Restrictions Precautions Precautions: Fall;Cervical Precaution Comments: spinal/cervical precautions; pt with halo Required Braces or Orthoses: Other Brace Cervical Brace: At all times Other Brace: Halo collar Restrictions Weight Bearing Restrictions: No       Mobility Bed Mobility               General bed mobility comments: Pt up in recliner upon arrival  Transfers Overall transfer level: Needs assistance Equipment used: Rolling walker (2 wheeled) Transfers: Sit to/from Stand Sit to Stand: Min guard         General transfer comment: VCs to try to use legs as much as possible and not rely so heavily on arms    Balance Overall balance assessment: Needs assistance Sitting-balance support: No upper extremity supported Sitting balance-[name] Scale: Good     Standing balance support: No upper extremity  supported;During functional activity Standing balance-[name] Scale: Fair Standing balance comment: standing to wash hands at sink                           ADL either performed or assessed with clinical judgement   ADL Overall ADL's : Needs assistance/impaired     Grooming: Min guard;Standing;Wash/dry hands               Lower Body Dressing: Min guard;With adaptive equipment;Sit to/from stand   Toilet Transfer: Min guard;Ambulation;RW;Comfort height toilet;Grab bars Toilet Transfer Details (indicate cue type and reason): Also had pt work on sit<>stand to/from standard height toilet surface (like she has at home) with pt being min A due to lowness of it. Toileting- Water quality scientist and Hygiene: Min guard;Sit to/from stand Toileting - Clothing Manipulation Details (indicate cue type and reason): comfort height toilet with grab bar       General ADL Comments: Pt total A to wash her hair due to Halo (used no rinse cleanser/shampoo)--per pt MD said it was fine to wash her hair that after finished RN needed to put antibiotic cream back around pin sites.     Vision Patient Visual Report: No change from baseline            Cognition Arousal/Alertness: Awake/alert Behavior During Therapy: WFL for tasks assessed/performed Overall Cognitive Status: Within Functional Limits for tasks assessed  Pertinent Vitals/ Pain       Pain Assessment: 0-10 Pain Score: 3  Pain Location: head Pain Descriptors / Indicators: Aching;Sore Pain Intervention(s): Limited activity within patient's tolerance;Monitored during session;Patient requesting pain meds-RN notified;RN gave pain meds during session         Frequency  Min 2X/week        Progress Toward Goals  OT Goals(current goals can now be found in the care plan section)  Progress towards OT goals: Progressing toward goals  Acute Rehab OT  Goals Patient Stated Goal: get rehab to get stronger and independent OT Goal Formulation: With patient Time For Goal Achievement: 04/30/20 Potential to Achieve Goals: Good  Plan Discharge plan remains appropriate       AM-PAC OT "6 Clicks" Daily Activity     Outcome Measure   Help from another person eating meals?: None Help from another person taking care of personal grooming?: A Little Help from another person toileting, which includes using toliet, bedpan, or urinal?: A Little Help from another person bathing (including washing, rinsing, drying)?: A Little (with AE) Help from another person to put on and taking off regular upper body clothing?: A Little Help from another person to put on and taking off regular lower body clothing?: A Little (with AE) 6 Click Score: 19    End of Session Equipment Utilized During Treatment: Rolling walker (halo brace)  OT Visit Diagnosis: Unsteadiness on feet (R26.81);Pain Pain - part of body:  (head/neck)   Activity Tolerance Patient tolerated treatment well   Patient Left in chair;with call bell/phone within reach;with nursing/sitter in room   Nurse Communication  (finished with washing pt's hair)        Time: 0722-5750 OT Time Calculation (min): 33 min  Charges: OT General Charges $OT Visit: 1 Visit OT Treatments $Self Care/Home Management : 23-37 mins [place], OTR/L Acute [employer] Pager [phone] Office [phone]      Almon Register 04/28/2020, 10:19 AM

## 2020-04-28 NOTE — Progress Notes (Signed)
Physical Therapy Treatment Patient Details Name: Leah Franco MRN: [id number] DOB: 1965-08-02 Today's Date: 04/28/2020    History of Present Illness 54 yo female admitted to ED on 12/6 with fall. CT and MRI cervical spine reveal acute mildly displaced type III odontoid fracture, C1 bilateral posterior arch fracture, L L3-4 widening at facet joint, mild edema ALL at dens reveals strain vs disruption. Worsening displacement, s/p Halo collar application on 40/98. 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    PT Comments    The pt continues to make good progress with mobility and PT goals at this time. She was able to tolerate a series of standing exercises in the room to progress dynamic stability and LE strength as well as a bout of hallway ambulation with use of a RW. The pt continues to be limited by deficits in strength, stability, power, and endurance at this time, as well as ongoing issues with pain control in her neck and back. The pt continues to demo good understanding of cervical precautions and mobility with halo, but will continue to benefit from skilled PT to progress independence with bed mobility, endurance, and dynamic stability to reduce risk of falls and fall-related injury at home.     Follow Up Recommendations  CIR     Equipment Recommendations  Rolling walker with 5" wheels;3in1 (PT);Hospital bed    Recommendations for Other Services       Precautions / Restrictions Precautions Precautions: Fall;Cervical Precaution Booklet Issued: Yes (comment) Precaution Comments: spinal/cervical precautions; pt with halo Required Braces or Orthoses: Other Brace Cervical Brace: At all times Other Brace: Halo collar Restrictions Weight Bearing Restrictions: No    Mobility  Bed Mobility Overal bed mobility: Needs Assistance             General bed mobility comments: pt OOB in recliner at start and end  of session  Transfers Overall transfer level: Needs assistance Equipment used: Rolling walker (2 wheeled) Transfers: Sit to/from Stand Sit to Stand: Min guard Stand pivot transfers: Min assist       General transfer comment: minG for safety and verbal cues. requires increased time to power up  Ambulation/Gait Ambulation/Gait assistance: Min guard;Min assist Gait Distance (Feet): 30 Feet (+50) Assistive device: Rolling walker (2 wheeled) Gait Pattern/deviations: Step-through pattern;Decreased stride length;Decreased dorsiflexion - left;Shuffle Gait velocity: decreased   General Gait Details: slow, able to steady with RW, decreased DF with LLE, but pt able to consistently clear toes without physical assist. slowed gait with fatigue.          Balance Overall balance assessment: Needs assistance Sitting-balance support: No upper extremity supported Sitting balance-[name] Scale: Good Sitting balance - Comments: able to sit EOB without PT support   Standing balance support: No upper extremity supported;During functional activity Standing balance-[name] Scale: Fair Standing balance comment: static stand for exercises, but benefits from single UE support                            Cognition Arousal/Alertness: Awake/alert Behavior During Therapy: WFL for tasks assessed/performed Overall Cognitive Status: Within Functional Limits for tasks assessed                                 General Comments: very pleasant, motivated to progress and good recall of exercises  Exercises General Exercises - Lower Extremity Hip ABduction/ADduction: AROM;Both;20 reps;Standing Hip Flexion/Marching: AROM;Both;10 reps;Standing Heel Raises: Strengthening;Both;15 reps;Standing    General Comments General comments (skin integrity, edema, etc.): VSS on RA,  pt highly motivated      Pertinent Vitals/Pain Pain Assessment: 0-10 Pain Score: 5  Pain Location:  head Pain Descriptors / Indicators: Aching;Sore Pain Intervention(s): Limited activity within patient's tolerance;Monitored during session;Repositioned           PT Goals (current goals can now be found in the care plan section) Acute Rehab PT Goals Patient Stated Goal: get rehab to get stronger and independent PT Goal Formulation: With patient Time For Goal Achievement: 05/06/20 Potential to Achieve Goals: Good Progress towards PT goals: Progressing toward goals    Frequency    Min 4X/week      PT Plan Current plan remains appropriate       AM-PAC PT "6 Clicks" Mobility   Outcome Measure  Help needed turning from your back to your side while in a flat bed without using bedrails?: A Little Help needed moving from lying on your back to sitting on the side of a flat bed without using bedrails?: A Little Help needed moving to and from a bed to a chair (including a wheelchair)?: A Little Help needed standing up from a chair using your arms (e.g., wheelchair or bedside chair)?: A Little Help needed to walk in hospital room?: A Little Help needed climbing 3-5 steps with a railing? : A Lot 6 Click Score: 17    End of Session Equipment Utilized During Treatment: Other (comment);Gait belt Activity Tolerance: Patient tolerated treatment well Patient left: with call bell/phone within reach;Other (comment);in chair Nurse Communication: Mobility status PT Visit Diagnosis: Other abnormalities of gait and mobility (R26.89);Difficulty in walking, not elsewhere classified (R26.2);Pain Pain - part of body:  (neck, back)     Time: 2979-8921 PT Time Calculation (min) (ACUTE ONLY): 49 min  Charges:  $Gait Training: 8-22 mins $Therapeutic Exercise: 8-22 mins $Therapeutic Activity: 8-22 mins                    Karma Ganja, PT, DPT   Acute Rehabilitation Department Pager #: [phone]   Otho Bellows 04/28/2020, 11:37 AM

## 2020-04-28 NOTE — Progress Notes (Signed)
Neurosurgery Service Progress Note  Subjective: No acute events overnight, having some neck pain but stable   Objective: Vitals:   04/27/20 1545 04/27/20 1941 04/28/20 0357 04/28/20 0720  BP: (!) 126/92 (!) 126/96 (!) 125/96 (!) 128/98  Pulse: 93 99 93 98  Resp: 16   20  Temp: 98.2 F (36.8 C) 97.8 F (36.6 C) 97.8 F (36.6 C) 97.9 F (36.6 C)  TempSrc: Oral Oral Oral   SpO2: 96% 100% 100% 99%  Weight:      Height:        Physical Exam: Sitting upright in bed with halo in good position, FCx4 without preference  Assessment & Plan: 54 y.o. woman s/p halo for C2 frx, recovering well.  -rehab placement pending -cont PT/OT -SCDs/TEDs/SQH -macrocytic anemia on admission, cont multivitamin -foley has been out for a week w/o retention, can d/c bethanechol  Marcello Moores A Bee Marchiano  04/28/20 11:09 AM

## 2020-04-29 ENCOUNTER — Inpatient Hospital Stay (HOSPITAL_COMMUNITY): Payer: PRIVATE HEALTH INSURANCE

## 2020-04-29 DIAGNOSIS — W010XXA Fall on same level from slipping, tripping and stumbling without subsequent striking against object, initial encounter: Secondary | ICD-10-CM | POA: Diagnosis not present

## 2020-04-29 DIAGNOSIS — R609 Edema, unspecified: Secondary | ICD-10-CM

## 2020-04-29 DIAGNOSIS — R140 Abdominal distension (gaseous): Secondary | ICD-10-CM | POA: Diagnosis not present

## 2020-04-29 DIAGNOSIS — L538 Other specified erythematous conditions: Secondary | ICD-10-CM

## 2020-04-29 DIAGNOSIS — I739 Peripheral vascular disease, unspecified: Secondary | ICD-10-CM

## 2020-04-29 DIAGNOSIS — E1140 Type 2 diabetes mellitus with diabetic neuropathy, unspecified: Secondary | ICD-10-CM | POA: Diagnosis not present

## 2020-04-29 DIAGNOSIS — I890 Lymphedema, not elsewhere classified: Secondary | ICD-10-CM

## 2020-04-29 DIAGNOSIS — G629 Polyneuropathy, unspecified: Secondary | ICD-10-CM

## 2020-04-29 LAB — COMPREHENSIVE METABOLIC PANEL
ALT: 23 U/L (ref 0–44)
AST: 17 U/L (ref 15–41)
Albumin: 3 g/dL — ABNORMAL LOW (ref 3.5–5.0)
Alkaline Phosphatase: 75 U/L (ref 38–126)
Anion gap: 10 (ref 5–15)
BUN: 11 mg/dL (ref 6–20)
CO2: 27 mmol/L (ref 22–32)
Calcium: 8.3 mg/dL — ABNORMAL LOW (ref 8.9–10.3)
Chloride: 96 mmol/L — ABNORMAL LOW (ref 98–111)
Creatinine, Ser: 0.67 mg/dL (ref 0.44–1.00)
GFR, Estimated: >60 mL/min (ref >60)
Glucose, Bld: 328 mg/dL — ABNORMAL HIGH (ref 70–99)
Potassium: 4.3 mmol/L (ref 3.5–5.1)
Sodium: 133 mmol/L — ABNORMAL LOW (ref 135–145)
Total Bilirubin: 0.4 mg/dL (ref 0.3–1.2)
Total Protein: 5.6 g/dL — ABNORMAL LOW (ref 6.5–8.1)

## 2020-04-29 LAB — CBC WITH DIFFERENTIAL/PLATELET
Abs Immature Granulocytes: 0.04 10*3/uL (ref 0.00–0.07)
Basophils Absolute: 0 10*3/uL (ref 0.0–0.1)
Basophils Relative: 0 %
Eosinophils Absolute: 0.1 10*3/uL (ref 0.0–0.5)
Eosinophils Relative: 1 %
HCT: 39 % (ref 36.0–46.0)
Hemoglobin: 11.6 g/dL — ABNORMAL LOW (ref 12.0–15.0)
Immature Granulocytes: 1 %
Lymphocytes Relative: 13 %
Lymphs Abs: 1.1 10*3/uL (ref 0.7–4.0)
MCH: 29.8 pg (ref 26.0–34.0)
MCHC: 29.7 g/dL — ABNORMAL LOW (ref 30.0–36.0)
MCV: 100.3 fL — ABNORMAL HIGH (ref 80.0–100.0)
Monocytes Absolute: 0.6 10*3/uL (ref 0.1–1.0)
Monocytes Relative: 7 %
Neutro Abs: 6.7 10*3/uL (ref 1.7–7.7)
Neutrophils Relative %: 78 %
Platelets: 559 10*3/uL — ABNORMAL HIGH (ref 150–400)
RBC: 3.89 MIL/uL (ref 3.87–5.11)
RDW: 15 % (ref 11.5–15.5)
WBC: 8.4 10*3/uL (ref 4.0–10.5)
nRBC: 0 % (ref 0.0–0.2)

## 2020-04-29 LAB — C-REACTIVE PROTEIN: CRP: 0.5 mg/dL (ref <1.0)

## 2020-04-29 LAB — SEDIMENTATION RATE: Sed Rate: 3 mm/hr (ref 0–22)

## 2020-04-29 LAB — LACTIC ACID, PLASMA: Lactic Acid, Venous: 2.2 mmol/L (ref 0.5–1.9)

## 2020-04-29 MED ORDER — INSULIN ASPART 100 UNIT/ML ~~LOC~~ SOLN
4.0000 [IU] | Freq: Three times a day (TID) | SUBCUTANEOUS | Status: DC
  Administered 2020-04-30 – 2020-05-02 (×6): 4 [IU] via SUBCUTANEOUS

## 2020-04-29 MED ORDER — INSULIN ASPART 100 UNIT/ML ~~LOC~~ SOLN
0.0000 [IU] | Freq: Three times a day (TID) | SUBCUTANEOUS | Status: DC
  Administered 2020-05-01: 13:00:00 5 [IU] via SUBCUTANEOUS
  Administered 2020-05-01: 09:00:00 3 [IU] via SUBCUTANEOUS
  Administered 2020-05-02: 09:00:00 2 [IU] via SUBCUTANEOUS
  Administered 2020-05-02: 13:00:00 8 [IU] via SUBCUTANEOUS

## 2020-04-29 MED ORDER — INSULIN ASPART 100 UNIT/ML ~~LOC~~ SOLN
0.0000 [IU] | Freq: Every day | SUBCUTANEOUS | Status: DC
  Administered 2020-04-30: 22:00:00 3 [IU] via SUBCUTANEOUS
  Administered 2020-05-02: 22:00:00 2 [IU] via SUBCUTANEOUS
  Administered 2020-05-03: 23:00:00 3 [IU] via SUBCUTANEOUS

## 2020-04-29 NOTE — Progress Notes (Addendum)
Patient ID: Leah Franco, female   DOB: 05/16/1965, 54 y.o.   MRN: [id number] Vital signs are stable Patient is tolerating halo vest well Patient has swelling in left lower extremity and some discoloration appears to be worsening.  She is also aware of some sensation.  We ordered a Doppler this afternoon to rule out DVT and this was negative.  Concern is that she may be developing some cellulitis in the left lower extremity.  I will ask hospitalist to see and evaluate this further opinion and to start appropriate treatment if they feel this is consistent with cellulitis.

## 2020-04-29 NOTE — Plan of Care (Signed)
Patient progressing as expected, except as noted below.  Will continue to monitor.  Problem: Clinical Measurements: Goal: Postoperative complications will be avoided or minimized Outcome: Not Progressing Note: Pitting edema to BLE, L>R; left lower leg red and significantly warmer than right; MD informed and awaiting possible orders to r/o DVT

## 2020-04-29 NOTE — TOC Progression Note (Addendum)
Transition of Care (TOC) - Progression Note  Marvetta Gibbons RN,BSN Transitions of Care Unit 4NP (non trauma) - RN Case Manager See Treatment Team for direct Phone #   Patient Details  Name: Leah Franco MRN: [id number] Date of Birth: 30-Jan-1966  Transition of Care [facility]) CM/SW Contact  [name], Romeo Rabon, RN Phone Number: 04/29/2020, 11:58 AM  Clinical Narrative:    Have spoken with Nevin Bloodgood with [facility]- per TC their MD is reviewing clinicals today, info also has been sent over to [place] [facility] which is the sister rehab to them.  Call received from [facility] at [facility] that MD has approved pt for [facility] rehab and they will start insurance auth today- Pam will reach out to pt regarding bed offer. This Probation officer also spoke with pt at bedside regarding INPT bed offer for [facility]- pt open to [facility] however still states her first preference remains for [facility]- Pam with [facility] aware of request.   [id number]- have received call from [place] at [facility]- they have received insurance auth for INPT admit- they are offering bed available tomorrow for admit to their [place] rehab program. Pt would need updated COVID test prior to transfer, and d/c summary prior to transfer. Please advise if pt medically cleared for transition to [facility] in [facility]- and if so please order COVID test.    Expected Discharge Plan: IP Rehab Facility Barriers to Discharge: Continued Medical Work up  Expected Discharge Plan and Services Expected Discharge Plan: [place] In-house Referral: Clinical Social Work Discharge Planning Services: CM Consult Post Acute Care Choice: IP Rehab Living arrangements for the past 2 months: Single Family Home                                       Social Determinants of Health (SDOH) Interventions    Readmission Risk Interventions No flowsheet data found.

## 2020-04-29 NOTE — Progress Notes (Signed)
RN noticed patient's left leg red and more swollen than normal.  RN assessed and patient feels no pain but it is rudy red and edematous, will notify day shift RN and doctor.

## 2020-04-29 NOTE — Progress Notes (Signed)
Physical Therapy Treatment Patient Details Name: Leah Franco MRN: [id number] DOB: 02-21-1966 Today's Date: 04/29/2020    History of Present Illness 54 yo female admitted to ED on 12/6 with fall. CT and MRI cervical spine reveal acute mildly displaced type III odontoid fracture, C1 bilateral posterior arch fracture, L L3-4 widening at facet joint, mild edema ALL at dens reveals strain vs disruption. Worsening displacement, s/p Halo collar application on 09/98. 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    PT Comments    Pt up in bathroom upon PT arrival to room, complaining of migraine-type pain. PT assisted pt to recliner with min assist overall, further mobility not attempted due to new redness, pitting edema, and calf tenderness on LLE. Pt's RN notified and this PT requesting further work up. PT to continue to follow acutely.     Follow Up Recommendations  CIR     Equipment Recommendations  Rolling walker with 5" wheels;3in1 (PT);Hospital bed    Recommendations for Other Services       Precautions / Restrictions Precautions Precautions: Fall;Cervical Precaution Booklet Issued: Yes (comment) Precaution Comments: spinal/cervical precautions; pt with halo Required Braces or Orthoses: Other Brace Cervical Brace: At all times Other Brace: Halo collar    Mobility  Bed Mobility Overal bed mobility: Needs Assistance             General bed mobility comments: up on toilet upon PT arrival to room, requesting up in chair  Transfers Overall transfer level: Needs assistance Equipment used: Rolling walker (2 wheeled) Transfers: Sit to/from Stand Sit to Stand: Min guard         General transfer comment: for safety, increased time to rise and steady.  Ambulation/Gait Ambulation/Gait assistance: Min guard;Min assist Gait Distance (Feet): 15 Feet Assistive device: Rolling walker (2 wheeled) Gait  Pattern/deviations: Step-through pattern;Decreased stride length;Decreased dorsiflexion - left;Shuffle Gait velocity: decr   General Gait Details: Min guard for safety, occasional min assist to steady.  Gait distance limited by migraine-type pain, LLE edema/rubor.   Stairs             Wheelchair Mobility    Modified Rankin (Stroke Patients Only)       Balance Overall balance assessment: Needs assistance Sitting-balance support: No upper extremity supported Sitting balance-[name] Scale: Good Sitting balance - Comments: able to sit EOB without PT support   Standing balance support: No upper extremity supported;During functional activity Standing balance-[name] Scale: Fair Standing balance comment: Pt benefits from at least single UE support                            Cognition Arousal/Alertness: Awake/alert Behavior During Therapy: WFL for tasks assessed/performed Overall Cognitive Status: Within Functional Limits for tasks assessed                                        Exercises      General Comments General comments (skin integrity, edema, etc.): New redness, pitting edema, and calf tenderness on LLE, RN notified and this PT requesting further work up.      Pertinent Vitals/Pain Pain Assessment: Faces Faces Pain Scale: Hurts even more Pain Location: head Pain Descriptors / Indicators: Aching;Sore;Other (Comment) (migraine) Pain Intervention(s): Limited activity within patient's tolerance;Monitored during session    Home  Living                      Prior Function            PT Goals (current goals can now be found in the care plan section) Acute Rehab PT Goals Patient Stated Goal: get rehab to get stronger and independent PT Goal Formulation: With patient Time For Goal Achievement: 05/06/20 Potential to Achieve Goals: Good Progress towards PT goals: Progressing toward goals    Frequency    Min 4X/week       PT Plan Current plan remains appropriate    Co-evaluation              AM-PAC PT "6 Clicks" Mobility   Outcome Measure  Help needed turning from your back to your side while in a flat bed without using bedrails?: A Little Help needed moving from lying on your back to sitting on the side of a flat bed without using bedrails?: A Little Help needed moving to and from a bed to a chair (including a wheelchair)?: A Little Help needed standing up from a chair using your arms (e.g., wheelchair or bedside chair)?: A Little Help needed to walk in hospital room?: A Little Help needed climbing 3-5 steps with a railing? : A Lot 6 Click Score: 17    End of Session   Activity Tolerance: Patient tolerated treatment well;Patient limited by fatigue Patient left: with call bell/phone within reach;Other (comment);in chair (LEs elevated) Nurse Communication: Mobility status PT Visit Diagnosis: Other abnormalities of gait and mobility (R26.89);Difficulty in walking, not elsewhere classified (R26.2);Pain Pain - part of body:  (neck, back)     Time: 0626-9485 PT Time Calculation (min) (ACUTE ONLY): 12 min  Charges:  $Therapeutic Activity: 8-22 mins                     Stacie Glaze, PT Acute Rehabilitation Services Pager [phone]  Office [phone]    Roxine Caddy E Ruffin Pyo 04/29/2020, 2:11 PM

## 2020-04-29 NOTE — Progress Notes (Signed)
Nutrition Follow-up  DOCUMENTATION CODES:   Obesity unspecified  INTERVENTION:   -Continue Ensure Enlive po BID, each supplement provides 350 kcal and 20 grams of protein -Continue MVI with minerals daily  NUTRITION DIAGNOSIS:   Increased nutrient needs related to acute illness as evidenced by estimated needs.  Ongoing  GOAL:   Patient will meet greater than or equal to 90% of their needs  Progressing   MONITOR:   PO intake,Supplement acceptance,Skin,Weight trends,Labs,I & O's  REASON FOR ASSESSMENT:   Malnutrition Screening Tool    ASSESSMENT:   54 year old female with history of DM, neuropathy and foot drop in her left leg presents after fall. CT with odontoid fx, spinal process fx/facet fx  12/12- s/p Procedure: Halo application for stabilization of unstable odontoid fracture with fluoroscopic assistance  Reviewed I/O's: +800 ml x 24 hours and -7.7 L since admission  Attempted to speak with pt via call to hospital room phone, however, unable to reach.   Pt with variable intake; documented meal completion 25-100%, averaging 50-75% of meals. Pt continues to consume Ensure supplements.   Pt awaiting bed placement for CIR/SNF.   Medications reviewed and include vitamin D and senokot.   Labs reviewed: CBGS: 128 (inpatient orders for glycemic control are 500 mg metformin daily and 1000 mg metformin daily).   Diet Order:   Diet Order            Diet Carb Modified Fluid consistency: Thin; Room service appropriate? Yes  Diet effective now                 EDUCATION NEEDS:   Not appropriate for education at this time  Skin:  Skin Assessment: Reviewed RN Assessment Skin Integrity Issues:: Other (Comment),Incisions Incisions: closed head Other: upper face laceration with staples  Last BM:  04/27/20  Height:   Ht Readings from Last 1 Encounters:  04/21/20 5' 2.99" (1.6 m)    Weight:   Wt Readings from Last 1 Encounters:  04/21/20 85.7 kg     Ideal Body Weight:  52.3 kg  BMI:  Body mass index is 33.49 kg/m.  Estimated Nutritional Needs:   Kcal:  1800-1950  Protein:  85-100 grams  Fluid:  >/= 1.8 L/day    Loistine Chance, RD, LDN, [place] Registered Dietitian II Certified Diabetes Care and Education Specialist Please refer to [facility] for RD and/or RD on-call/weekend/after hours pager

## 2020-04-29 NOTE — Progress Notes (Signed)
Bilateral lower extremity venous duplex complete.  Please see CV proc tab for preliminary results. Lita Mains- RDMS, RVT 4:51 PM  04/29/2020

## 2020-04-29 NOTE — Progress Notes (Signed)
Paged Dr. Roel Cluck about critical lactic acid of 2.2.

## 2020-04-30 ENCOUNTER — Inpatient Hospital Stay (HOSPITAL_COMMUNITY): Payer: PRIVATE HEALTH INSURANCE

## 2020-04-30 DIAGNOSIS — S12100A Unspecified displaced fracture of second cervical vertebra, initial encounter for closed fracture: Secondary | ICD-10-CM

## 2020-04-30 DIAGNOSIS — S0181XA Laceration without foreign body of other part of head, initial encounter: Secondary | ICD-10-CM

## 2020-04-30 DIAGNOSIS — S12400A Unspecified displaced fracture of fifth cervical vertebra, initial encounter for closed fracture: Secondary | ICD-10-CM

## 2020-04-30 DIAGNOSIS — S12120A Other displaced dens fracture, initial encounter for closed fracture: Secondary | ICD-10-CM

## 2020-04-30 DIAGNOSIS — R140 Abdominal distension (gaseous): Secondary | ICD-10-CM | POA: Diagnosis not present

## 2020-04-30 DIAGNOSIS — W19XXXA Unspecified fall, initial encounter: Secondary | ICD-10-CM

## 2020-04-30 DIAGNOSIS — E118 Type 2 diabetes mellitus with unspecified complications: Secondary | ICD-10-CM

## 2020-04-30 DIAGNOSIS — S129XXA Fracture of neck, unspecified, initial encounter: Secondary | ICD-10-CM

## 2020-04-30 DIAGNOSIS — E0842 Diabetes mellitus due to underlying condition with diabetic polyneuropathy: Secondary | ICD-10-CM

## 2020-04-30 DIAGNOSIS — R0989 Other specified symptoms and signs involving the circulatory and respiratory systems: Secondary | ICD-10-CM

## 2020-04-30 LAB — URINALYSIS, ROUTINE W REFLEX MICROSCOPIC
Bilirubin Urine: NEGATIVE
Glucose, UA: >=500 mg/dL — AB
Hgb urine dipstick: NEGATIVE
Ketones, ur: NEGATIVE mg/dL
Nitrite: NEGATIVE
Protein, ur: NEGATIVE mg/dL
Specific Gravity, Urine: 1.02 (ref 1.005–1.030)
pH: 6 (ref 5.0–8.0)

## 2020-04-30 LAB — PREALBUMIN: Prealbumin: 30.3 mg/dL (ref 18–38)

## 2020-04-30 LAB — BASIC METABOLIC PANEL
Anion gap: 12 (ref 5–15)
BUN: 11 mg/dL (ref 6–20)
CO2: 28 mmol/L (ref 22–32)
Calcium: 9.2 mg/dL (ref 8.9–10.3)
Chloride: 101 mmol/L (ref 98–111)
Creatinine, Ser: 0.56 mg/dL (ref 0.44–1.00)
GFR, Estimated: >60 mL/min (ref >60)
Glucose, Bld: 81 mg/dL (ref 70–99)
Potassium: 4.3 mmol/L (ref 3.5–5.1)
Sodium: 141 mmol/L (ref 135–145)

## 2020-04-30 LAB — GLUCOSE, CAPILLARY
Glucose-Capillary: 105 mg/dL — ABNORMAL HIGH (ref 70–99)
Glucose-Capillary: 110 mg/dL — ABNORMAL HIGH (ref 70–99)
Glucose-Capillary: 156 mg/dL — ABNORMAL HIGH (ref 70–99)
Glucose-Capillary: 289 mg/dL — ABNORMAL HIGH (ref 70–99)
Glucose-Capillary: 91 mg/dL (ref 70–99)

## 2020-04-30 LAB — PROTIME-INR
INR: 1 (ref 0.8–1.2)
Prothrombin Time: 12.5 seconds (ref 11.4–15.2)

## 2020-04-30 LAB — HEMOGLOBIN A1C
Hgb A1c MFr Bld: 5.8 % — ABNORMAL HIGH (ref 4.8–5.6)
Mean Plasma Glucose: 119.76 mg/dL

## 2020-04-30 LAB — LACTIC ACID, PLASMA: Lactic Acid, Venous: 3.8 mmol/L (ref 0.5–1.9)

## 2020-04-30 LAB — BRAIN NATRIURETIC PEPTIDE: B Natriuretic Peptide: 39.1 pg/mL (ref 0.0–100.0)

## 2020-04-30 LAB — MRSA PCR SCREENING: MRSA by PCR: NEGATIVE

## 2020-04-30 MED ORDER — FUROSEMIDE 10 MG/ML IJ SOLN
60.0000 mg | Freq: Every day | INTRAMUSCULAR | Status: DC
  Administered 2020-04-30 – 2020-05-05 (×5): 60 mg via INTRAVENOUS
  Filled 2020-04-30 (×5): qty 6

## 2020-04-30 MED ORDER — LISINOPRIL 2.5 MG PO TABS
2.5000 mg | ORAL_TABLET | Freq: Every day | ORAL | Status: DC
  Administered 2020-04-30 – 2020-05-05 (×6): 2.5 mg via ORAL
  Filled 2020-04-30 (×6): qty 1

## 2020-04-30 NOTE — Progress Notes (Signed)
ABI's have been completed. Preliminary results can be found in CV Proc through chart review.   04/30/20 12:19 PM Leah Franco RVT

## 2020-04-30 NOTE — Progress Notes (Signed)
Neurosurgery Service Progress Note  Subjective: No acute events overnight  Objective: Vitals:   04/30/20 0756 04/30/20 1201 04/30/20 1201 04/30/20 1210  BP: (!) 131/103 (!) 140/112 (!) 140/112 (!) 140/112  Pulse: 89 85 86 86  Resp: 17   17  Temp: 97.9 F (36.6 C)   98.3 F (36.8 C)  TempSrc: Oral   Oral  SpO2: 96% 100% 100% 100%  Weight:      Height:        Physical Exam: Sitting upright in bed with halo in good position, FCx4 without preference, chronic L foot drop  Assessment & Plan: 54 y.o. woman s/p halo placement for C2 frx, recovering well.  -rehab placement pending -cont PT/OT -SCDs/TEDs/SQH -macrocytic anemia on admission, cont multivitamin -medicine recs re LLE changes, [place] neg, normal ABIs  Marcello Moores A Sravya Grissom  04/30/20 1:08 PM

## 2020-04-30 NOTE — TOC Progression Note (Signed)
Transition of Care (TOC) - Progression Note  Marvetta Gibbons RN,BSN Transitions of Care Unit 4NP (non trauma) - RN Case Manager See Treatment Team for direct Phone #   Patient Details  Name: Leah Franco MRN: [id number] Date of Birth: 06/15/1965  Transition of Care [facility]) CM/SW Contact  [name], Romeo Rabon, RN Phone Number: 04/30/2020, 4:00 PM  Clinical Narrative:    Damaris Schooner with Pam at [facility]- they can still admit pt tomorrow with bed available if medically cleared - they will not be able to admit on Friday 12/24 due to the Christmas Holiday and would most likely wait until next week if unable to admit tomorrow 12/23.  Admitting MD for [facility] INPT rehab- Dr. Levada Schilling  Report line for bedside RN to call report- [phone]  [facility] to f[facility]u tomorrow - pt will need COVID testing prior to transfer to [facility] if medically cleared, and also a d/c summary.   Pam's contact at [facility]- [phone]   Expected Discharge Plan: [place] Barriers to Discharge: Continued Medical Work up  Expected Discharge Plan and Services Expected Discharge Plan: [place] In-house Referral: Clinical Social Work Discharge Planning Services: CM Consult Post Acute Care Choice: IP Rehab Living arrangements for the past 2 months: Single Family Home                                       Social Determinants of Health (SDOH) Interventions    Readmission Risk Interventions No flowsheet data found.

## 2020-04-30 NOTE — Progress Notes (Signed)
Occupational Therapy Treatment Patient Details Name: Leah Franco MRN: [id number] DOB: 1966/04/20 Today's Date: 04/30/2020    History of present illness 54 yo female admitted to ED on 12/6 with fall. CT and MRI cervical spine reveal acute mildly displaced type III odontoid fracture, C1 bilateral posterior arch fracture, L L3-4 widening at facet joint, mild edema ALL at dens reveals strain vs disruption. Worsening displacement, s/p Halo collar application on 12/24. + redness, swelling, TTP L calf, LE [place] negative for DVT, workup with vascular for venous and/or arterial insufficiency. 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   OT comments  Pt progressing well towards goals with improving balance noted. Pt overall min guard for mobility to/from bathroom, toileting task and ADLs standing at sink. Pt able to stand statically without support during bimanual tasks. Collaborated with pt on AE and other tools to maximize independence and efficiency with ADLs. Pt demo use of reacher for LB dressing with no cues needed for maintaining precautions. Plan to progress endurance and further address balance during ADLs to decrease fall risk.    Follow Up Recommendations  CIR    Equipment Recommendations  3 in 1 bedside commode    Recommendations for Other Services Rehab consult    Precautions / Restrictions Precautions Precautions: Fall;Cervical Precaution Booklet Issued: Yes (comment) Precaution Comments: spinal/cervical precautions; pt with halo Required Braces or Orthoses: Other Brace Cervical Brace: At all times Other Brace: Halo vest Restrictions Weight Bearing Restrictions: No       Mobility Bed Mobility Overal bed mobility: Needs Assistance             General bed mobility comments: seated in recliner  Transfers Overall transfer level: Needs assistance Equipment used: Rolling walker (2 wheeled) Transfers:  Sit to/from Stand Sit to Stand: Min guard         General transfer comment: min guard progressing to supervision at times for sit to stand with RW    Balance Overall balance assessment: Needs assistance Sitting-balance support: No upper extremity supported Sitting balance-[name] Scale: Good Sitting balance - Comments: able to sit EOB without PT support   Standing balance support: Single extremity supported;Bilateral upper extremity supported;During functional activity Standing balance-[name] Scale: Fair Standing balance comment: fair static standing, benefits from UE support with dynamic tasks                           ADL either performed or assessed with clinical judgement   ADL Overall ADL's : Needs assistance/impaired     Grooming: Min guard;Standing;Oral care;Wash/dry hands Grooming Details (indicate cue type and reason): min guard standing at sink for safety, improving balance with bimanual tasks unsupported at sink with no overt LOB. Pt inquiring how to manage facial hair. Encouraged trial of small electric trimmer meant for facial hair as probable best option             Lower Body Dressing: Min guard;With adaptive equipment;Sit to/from stand Lower Body Dressing Details (indicate cue type and reason): Able to demo use of reacher to don shorts to knees, would need min guard for standing Toilet Transfer: Min guard;Ambulation;RW;Comfort height toilet;Grab bars Toilet Transfer Details (indicate cue type and reason): min guard for mobility to/from bathroom with RW, preference to make R turns with RW no LOB noted Toileting- Clothing Manipulation and Hygiene: Min guard;Sit to/from stand Toileting - Water quality scientist Details (indicate cue  type and reason): able to demo anterior hygiene seated on toilet. Reports using toileting aid intermittently in past that has been helpful     Functional mobility during ADLs: Min guard;Rolling walker;Cueing for  sequencing General ADL Comments: Pt progressing well with improving balance and problem solving.     Vision   Vision Assessment?: No apparent visual deficits   Perception     Praxis      Cognition Arousal/Alertness: Awake/alert Behavior During Therapy: WFL for tasks assessed/performed Overall Cognitive Status: Within Functional Limits for tasks assessed                                 General Comments: very pleasant, motivated to progress        Exercises General Exercises - Lower Extremity Ankle Circles/Pumps: AROM;AAROM;Both;10 reps;Seated (sustained hold, 5 seconds at end range DF)   Shoulder Instructions       General Comments Extended time spent problem solving positioning for sleeping comfortably and safely with Halo    Pertinent Vitals/ Pain       Pain Assessment: Faces Faces Pain Scale: Hurts even more Pain Location: head with louder noises Pain Descriptors / Indicators: Aching;Headache;Sore Pain Intervention(s): Monitored during session;RN gave pain meds during session  Home Living                                          Prior Functioning/Environment              Frequency  Min 2X/week        Progress Toward Goals  OT Goals(current goals can now be found in the care plan section)  Progress towards OT goals: Progressing toward goals  Acute Rehab OT Goals Patient Stated Goal: get rehab to get stronger and independent OT Goal Formulation: With patient Time For Goal Achievement: 04/30/20 Potential to Achieve Goals: Good ADL Goals Pt Will Perform Grooming: with supervision;standing Pt Will Perform Lower Body Bathing: with supervision;sit to/from stand;with adaptive equipment Pt Will Perform Lower Body Dressing: with supervision;with adaptive equipment;sit to/from stand Pt Will Transfer to Toilet: with supervision;ambulating;regular height toilet;bedside commode;grab bars Pt Will Perform Toileting - Clothing  Manipulation and hygiene: with supervision;sit to/from stand;with adaptive equipment Pt Will Perform Tub/Shower Transfer: Tub transfer;with min assist;ambulating;shower seat;rolling walker  Plan Discharge plan remains appropriate    Co-evaluation                 AM-PAC OT "6 Clicks" Daily Activity     Outcome Measure   Help from another person eating meals?: None Help from another person taking care of personal grooming?: A Little Help from another person toileting, which includes using toliet, bedpan, or urinal?: A Little Help from another person bathing (including washing, rinsing, drying)?: A Little Help from another person to put on and taking off regular upper body clothing?: A Little Help from another person to put on and taking off regular lower body clothing?: A Little 6 Click Score: 19    End of Session Equipment Utilized During Treatment: Gait belt;Rolling walker  OT Visit Diagnosis: Unsteadiness on feet (R26.81);Pain Pain - part of body:  (head, neck)   Activity Tolerance Patient tolerated treatment well   Patient Left in chair;with call bell/phone within reach   Nurse Communication Mobility status        Time: 9678-9381 OT  Time Calculation (min): 62 min  Charges: OT General Charges $OT Visit: 1 Visit OT Treatments $Self Care/Home Management : 38-52 mins $Therapeutic Activity: 8-22 mins  Layla Maw, OTR/L   Layla Maw 04/30/2020, 3:51 PM

## 2020-04-30 NOTE — Progress Notes (Signed)
Physical Therapy Treatment Patient Details Name: Leah Franco MRN: [id number] DOB: 11/08/1965 Today's Date: 04/30/2020    History of Present Illness 54 yo female admitted to ED on 12/6 with fall. CT and MRI cervical spine reveal acute mildly displaced type III odontoid fracture, C1 bilateral posterior arch fracture, L L3-4 widening at facet joint, mild edema ALL at dens reveals strain vs disruption. Worsening displacement, s/p Halo collar application on 40/98. + redness, swelling, TTP L calf, LE [place] negative for DVT, workup with vascular for venous and/or arterial insufficiency. 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    PT Comments    Pt reporting migraine, RN administering medication today. Pt ambulating good hallway distance with use of RW, PT cuing pt for form and safety during gait. Pt instructed in LE stretches to address heel cord tightness, will continue to follow acutely.    Follow Up Recommendations  CIR     Equipment Recommendations  Rolling walker with 5" wheels;3in1 (PT);Hospital bed    Recommendations for Other Services       Precautions / Restrictions Precautions Precautions: Fall;Cervical Precaution Booklet Issued: Yes (comment) Precaution Comments: spinal/cervical precautions; pt with halo Required Braces or Orthoses: Other Brace Cervical Brace: At all times Other Brace: Halo vest    Mobility  Bed Mobility Overal bed mobility: Needs Assistance             General bed mobility comments: up in chair upon PT arrival to room  Transfers   Equipment used: Rolling walker (2 wheeled) Transfers: Sit to/from Stand Sit to Stand: Min guard         General transfer comment: able to perform without physical assist, increased time to rise and steady  Ambulation/Gait Ambulation/Gait assistance: Min guard Gait Distance (Feet): 150 Feet Assistive device: Rolling walker (2  wheeled) Gait Pattern/deviations: Step-through pattern;Decreased stride length;Decreased dorsiflexion - left;Shuffle Gait velocity: decr   General Gait Details: min guard for safety, verbal cuing for upright posture, slowing gait speed for safety and as needed   Stairs             Wheelchair Mobility    Modified Rankin (Stroke Patients Only)       Balance Overall balance assessment: Needs assistance Sitting-balance support: No upper extremity supported Sitting balance-[name] Scale: Good Sitting balance - Comments: able to sit EOB without PT support   Standing balance support: No upper extremity supported;During functional activity Standing balance-[name] Scale: Fair Standing balance comment: can stand statically without UE support                            Cognition Arousal/Alertness: Awake/alert Behavior During Therapy: WFL for tasks assessed/performed Overall Cognitive Status: Within Functional Limits for tasks assessed                                 General Comments: very pleasant, motivated to progress      Exercises General Exercises - Lower Extremity Ankle Circles/Pumps: AROM;AAROM;Both;10 reps;Seated (sustained hold, 5 seconds at end range DF)    General Comments        Pertinent Vitals/Pain Pain Assessment: Faces Faces Pain Scale: Hurts even more Pain Location: head with louder noises Pain Descriptors / Indicators: Aching;Headache;Sore Pain Intervention(s): Monitored during session;RN gave pain meds during session    Home Living  Prior Function            PT Goals (current goals can now be found in the care plan section) Acute Rehab PT Goals Patient Stated Goal: get rehab to get stronger and independent PT Goal Formulation: With patient Time For Goal Achievement: 05/06/20 Potential to Achieve Goals: Good Progress towards PT goals: Progressing toward goals    Frequency    Min  4X/week      PT Plan Current plan remains appropriate    Co-evaluation              AM-PAC PT "6 Clicks" Mobility   Outcome Measure  Help needed turning from your back to your side while in a flat bed without using bedrails?: A Little Help needed moving from lying on your back to sitting on the side of a flat bed without using bedrails?: A Little Help needed moving to and from a bed to a chair (including a wheelchair)?: A Little Help needed standing up from a chair using your arms (e.g., wheelchair or bedside chair)?: A Little Help needed to walk in hospital room?: A Little Help needed climbing 3-5 steps with a railing? : A Lot 6 Click Score: 17    End of Session   Activity Tolerance: Patient tolerated treatment well;Patient limited by fatigue Patient left: with call bell/phone within reach;Other (comment);in chair (LEs elevated) Nurse Communication: Mobility status PT Visit Diagnosis: Other abnormalities of gait and mobility (R26.89);Difficulty in walking, not elsewhere classified (R26.2);Pain Pain - part of body:  (neck, back)     Time: 2947-6546 PT Time Calculation (min) (ACUTE ONLY): 39 min  Charges:  $Gait Training: 8-22 mins                     Stacie Glaze, PT Acute Rehabilitation Services Pager [phone]  Office [phone]  [place] 04/30/2020, 3:50 PM

## 2020-04-30 NOTE — Consult Note (Addendum)
[facility] Medical Consultation  Leah Franco QBV:[id number] DOB: [birth year] DOA: 04/14/2020 PCP: [name], MD   Requesting physician: Ellene Route Date of consultation: _0 @  Reason for consultation: Lef leg swelling possible cellulitis  Impression/Recommendations  Active Problems:   Diabetes mellitus ([place])   Neuropathy   Lymphedema   Cervical spine fracture ([place])   PAD (peripheral artery disease) ([place]) DM 2    1. LEft leg swelling - Doppler neg for DVT, low albumin could be contributing to leg edema as well as hx of venous insufficiency. patient has known foot drop on the left, peripheral neuropathy and PAD this combination can be contributing to parasthesias. Clinically at this time does not appear to have cellulitis.  No perceived leg redness tenderness or warmth.  Difference in color and temperature could be secondary to known peripheral arterial disease in left foot.  Increasing edema could be secondary to worsening hypoalbuminemia. Although TED hose could be helpful with edema and situation of peripheral arterial disease would hold off for now.  Ordered ABIs No elevated white blood cell count or inflammatory markers to suggest infectious process Would keep legs elevated  2. Hx of PAD of the left leg diagnosed at [facility] in 2018 -left foot with poor cap refill and cyanosis of the toes chronic.  Palpable but somewhat diminished pulses.  Will repeat ABIs patient would likely benefit from follow-up with vascular or vascular consult if severe abnormality noted  3. DM 2 with neuropathy -   Taking metformin 202m daily and Ozempic 0.598mweekly. Jardiance was causing yeast infections.  A1C was 6.6% in September down from 7.4% previously.  Hold Metformin given elevated lactic acid Order sliding scale Diabetes coordinator consult Continue to monitor blood sugars patient may need alternative agent to Metformin at the time of discharge  4. Chronic tachycardia - [facility]  5.  Elevated lactic acid -multifactorial likely combination of known hepatic disease, use of Metformin, peripheral arterial disease For right now hold Metformin Repeat lactic acid Would avoid fluid overload as patient likely will develop worsening edema in the given hypoalbuminemia  6 Hypoalbuminemia -nutritional versus hepatic.  Obtain prealbumin nutritional consult Likely contributing to generalized edema  7. Abdominal distention will obtain KUB to evaluate for any stool burden  I will followup again tomorrow. Please contact me if I can be of assistance in the meanwhile. Thank you for this consultation.  Chief Complaint:  Chief Complaint  Patient presents with  . Fall    HPI:  [age]o F with hx of DM2, neuropathy, HLD, obesity, Gastric bypass status for obesity, long standing leg edema due to venous insufficiency,iron def anemia and thrombocytosis, chronic tachycardia, GI bleed  Patient came in the recurrent falls while walking on stairs.  First fall was in November 2 fall was on 6 December.  Resulting in odontoid fracture admitted by neurosurgery in c-collar he will   Last night 3 Am her LEft leg started to get warm and red There were some abrasion on the leg Dopplers were negative for DVT Her toes have been cyanotic for the past 2 wks And chronic left drop Pulses present and no change She is having some sensory issues feeling like there is chord laying on the bed under her leg No fever.    Review of Systems:,   Pertinent positives include: leg swelling Left more than right   Constitutional:  No weight loss, night sweats, Fevers, chills, weight loss fatigue  HEENT:  No headaches, Difficulty swallowing,Tooth/dental problems,Sore throat,  No sneezing, itching, ear ache, nasal congestion, post nasal drip,  Cardio-vascular:  No chest pain, Orthopnea, PND, anasarca, dizziness, palpitations.no Bilateral lower extremity swelling   GI:  No heartburn, indigestion, abdominal pain, nausea, vomiting, diarrhea, change in bowel habits, loss of appetite, melena, blood in stool, hematemesis Resp:  No shortness of breath at rest. No dyspnea on exertion,No excess mucus, no productive cough, No non-productive cough, No coughing up of blood.No change in color of mucus.No wheezing. Skin:  no rash or lesions. No jaundice GU:  no dysuria, change in color of urine, no urgency or frequency. No straining to urinate.  No flank pain.  Musculoskeletal:  No joint pain or no joint swelling. No decreased range of motion. No back pain.  Psych:  No change in mood or affect. No depression or anxiety. No memory loss.  Neuro: no localizing neurological complaints, no tingling, no weakness, no double vision, no gait abnormality, no slurred speech, no confusion   All systems reviewed and apart from [place] all are negative  Past Medical History:  Diagnosis Date  . Anemia   . Arthritis   . Back pain   . Diabetes mellitus without complication ([place])   . Fatigue   . Fibroids   . GI bleeding 03/2020  . Hemorrhoid   . Hypertension   . Mitral valve prolapse   . MRSA (methicillin resistant Staphylococcus aureus)   . Night sweats   . Wound infection    Past Surgical History:  Procedure Laterality Date  . BACK SURGERY    . BREAST LUMPECTOMY     lft  . ESOPHAGOGASTRODUODENOSCOPY  03/12/2020   HOT HEMOSTASIS (ARGON PLASMA COAGULATION/BICAP) (N/A )  . ESOPHAGOGASTRODUODENOSCOPY (EGD) WITH PROPOFOL N/A 03/12/2020   Procedure: ESOPHAGOGASTRODUODENOSCOPY (EGD) WITH PROPOFOL;  Surgeon: Clarene Essex, MD;  Location: [facility];  Service: Endoscopy;  Laterality: N/A;  . GASTRIC BYPASS    . HALO APPLICATION N/A 37/02/6268   Procedure: HALO TRACTION APPLICATION;  Surgeon: Kary Kos, MD;  Location: [facility];  Service: Neurosurgery;  Laterality: N/A;  . HERNIA REPAIR    . HOT HEMOSTASIS N/A 03/12/2020   Procedure: HOT HEMOSTASIS (ARGON PLASMA  COAGULATION/BICAP);  Surgeon: Clarene Essex, MD;  Location: [facility];  Service: Endoscopy;  Laterality: N/A;  . SPINE SURGERY     corrective spine collapsed, scoliosis and scoliosis revision  . TOTAL HIP ARTHROPLASTY  2018   Social History:  reports that she has never smoked. She has never used smokeless tobacco. She reports that she does not drink alcohol and does not use drugs.  Allergies  Allergen Reactions  . Lactose Intolerance (Gi) Nausea And Vomiting and Other (See Comments)    gassy  . Tape Rash    Reaction to most kinds of adhesive   Family History  Problem Relation Age of Onset  . BRCA 1/2 Mother        BRCA2+ by default, no testing  . Breast cancer Sister 20       BRCA2+  . BRCA 1/2 Sister 23       BRCA2 +  . Pancreatic cancer Maternal Uncle        Dx in his 44s  . BRCA 1/2 Maternal Uncle        BRCA2+  . Prostate cancer Maternal Grandfather   . Breast cancer Maternal Aunt        dx in 39s  . Ovarian cancer Maternal Aunt  dx in 53s  . Esophageal cancer Maternal Uncle 62  . BRCA 1/2 Maternal Uncle        BRCA2+  . Colon cancer Maternal Uncle 31  . Breast cancer Cousin        dx in her 79s  . BRCA 1/2 Cousin        dx in her 22s; BRCA2+    Prior to Admission medications   Medication Sig Start Date End Date Taking? Authorizing Provider  atorvastatin (LIPITOR) 10 MG tablet Take 10 mg by mouth every evening.  10/02/19  Yes [provider]  cyclobenzaprine (FLEXERIL) 5 MG tablet Take 5 mg by mouth at bedtime as needed for muscle spasms.  03/17/20  Yes [provider]  denosumab (PROLIA) 60 MG/ML SOSY injection Inject 60 mg into the skin every 6 (six) months.   Yes [provider]  diphenhydramine-acetaminophen (TYLENOL PM) 25-500 MG TABS tablet Take 4 tablets by mouth at bedtime.   Yes [provider]  DULoxetine (CYMBALTA) 60 MG capsule Take 60 mg by mouth every evening. 03/08/20  Yes [provider]   ergocalciferol (VITAMIN D2) 50000 UNITS capsule Take 50,000 Units by mouth See admin instructions. Take one capsule (50000 units) by mouth four times weekly in the evening - Monday, Wednesday, Friday, Saturday   Yes [provider]  etanercept (ENBREL) 50 MG/ML injection Inject 50 mg into the skin every Tuesday.   Yes [provider]  gabapentin (NEURONTIN) 300 MG capsule Take 600 mg by mouth 3 (three) times daily. At bedtime take with an 800 mg tablet for a total dose of 1400 mg at night   Yes [provider]  gabapentin (NEURONTIN) 800 MG tablet Take 800 mg by mouth at bedtime.  11/29/19 11/28/20 Yes [provider]  metFORMIN (GLUCOPHAGE) 500 MG tablet Take 500-1,000 mg by mouth See admin instructions. Take one tablet (500 mg) by mouth with breakfast and lunch; take two tablets (1000 mg) at bedtime 04/05/16 01/14/21 Yes [provider]  metoprolol tartrate (LOPRESSOR) 25 MG tablet Take 25 mg by mouth 2 (two) times daily.  03/06/20  Yes [provider]  pantoprazole (PROTONIX) 40 MG tablet Take 1 tablet (40 mg total) by mouth 2 (two) times daily. 03/14/20 03/14/21 Yes Nita Sells, MD  Semaglutide,0.25 or 0.5MG/DOS, (OZEMPIC, 0.25 OR 0.5 MG/DOSE,) 2 MG/1.5ML SOPN Inject 0.5 mg into the skin every Tuesday.  01/15/20  Yes [provider]  sucralfate (CARAFATE) 1 g tablet Take 1 tablet (1 g total) by mouth 4 (four) times daily. 03/14/20 04/15/20 Yes Nita Sells, MD   Physical Exam: Blood pressure (!) 135/109, pulse (!) 114, temperature 98.2 F (36.8 C), temperature source Oral, resp. rate 20, height 5' 2.99" (1.6 m), weight 85.7 kg, last menstrual period 11/04/2010, SpO2 97 %. Vitals:   04/29/20 1531 04/29/20 1935  BP: (!) 141/106 (!) 135/109  Pulse: 89 (!) 114  Resp: 20   Temp: 98.4 F (36.9 C) 98.2 F (36.8 C)  SpO2: 99% 97%     1. General:  in No Acute distress  Halo present, hematoma of the scalp noted     Chronically ill  -appearing  2. Psychological: Alert and   Oriented  3. Head/ENT:     Dry Mucous Membranes                           Head Non traumatic, neck supple  Poor Dentition  4. SKIN: normal   Skin turgor,  Skin clean Dry and intact no rash  5. Heart: Regular rate and rhythm no Murmur, no Rub or gallop  6. Lungs:  Clear to auscultation bilaterally, no wheezes or crackles    7. Abdomen: Soft,  non-tender,    Distended  Obese hepatomegaly noted  8. Lower extremities: no clubbing, cyanosis, 2+ pitting edema          9. Neurologically Grossly intact, moving all 4 extremities equally  Except for left foot drop  10. MSK: Normal range of motion  Labs on Admission:  Basic Metabolic Panel: Recent Labs  Lab 04/29/20 2142  NA 133*  K 4.3  CL 96*  CO2 27  GLUCOSE 328*  BUN 11  CREATININE 0.67  CALCIUM 8.3*   Liver Function Tests: Recent Labs  Lab 04/29/20 2142  AST 17  ALT 23  ALKPHOS 75  BILITOT 0.4  PROT 5.6*  ALBUMIN 3.0*   CBC: Recent Labs  Lab 04/29/20 2142  WBC 8.4  NEUTROABS 6.7  HGB 11.6*  HCT 39.0  MCV 100.3*  PLT 559*    CBG: Recent Labs  Lab 04/30/20 0013  GLUCAP 156*    Radiological Exams on Admission: VAS [place] LOWER EXTREMITY VENOUS (DVT)  Result Date: 04/29/2020  Lower Venous DVT Study Indications: Edema, Erythema, and Lt>Rt.  Limitations: Body habitus and poor ultrasound/tissue interface. Performing Technologist: Antonieta Pert RDMS, RVT  Examination Guidelines: A complete evaluation includes B-mode imaging, spectral Doppler, color Doppler, and power Doppler as needed of all accessible portions of each vessel. Bilateral testing is considered an integral part of a complete examination. Limited examinations for reoccurring indications may be performed as noted. The reflux portion of the exam is performed with the patient in reverse Trendelenburg.   +---------+---------------+---------+-----------+----------+--------------+ RIGHT    CompressibilityPhasicitySpontaneityPropertiesThrombus Aging +---------+---------------+---------+-----------+----------+--------------+ CFV      Full           Yes      Yes                                 +---------+---------------+---------+-----------+----------+--------------+ SFJ      Full                                                        +---------+---------------+---------+-----------+----------+--------------+ FV Prox  Full                                                        +---------+---------------+---------+-----------+----------+--------------+ FV Mid   Full                                                        +---------+---------------+---------+-----------+----------+--------------+ FV DistalFull                                                        +---------+---------------+---------+-----------+----------+--------------+  PFV      Full                                                        +---------+---------------+---------+-----------+----------+--------------+ POP      Full           Yes      Yes                                 +---------+---------------+---------+-----------+----------+--------------+ PTV      Full                                                        +---------+---------------+---------+-----------+----------+--------------+ PERO     Full                                                        +---------+---------------+---------+-----------+----------+--------------+ GSV      Full                                                        +---------+---------------+---------+-----------+----------+--------------+   +---------+---------------+---------+-----------+----------+------------------+ LEFT     CompressibilityPhasicitySpontaneityPropertiesThrombus Aging      +---------+---------------+---------+-----------+----------+------------------+ CFV      Full           Yes      Yes                                     +---------+---------------+---------+-----------+----------+------------------+ SFJ      Full                                                            +---------+---------------+---------+-----------+----------+------------------+ FV Prox  Full                                                            +---------+---------------+---------+-----------+----------+------------------+ FV Mid   Full                                                            +---------+---------------+---------+-----------+----------+------------------+ FV DistalFull                                                            +---------+---------------+---------+-----------+----------+------------------+  PFV      Full                                                            +---------+---------------+---------+-----------+----------+------------------+ POP      Full           Yes      Yes                                     +---------+---------------+---------+-----------+----------+------------------+ PTV      Full                                                            +---------+---------------+---------+-----------+----------+------------------+ PERO                                                  Poor visualization +---------+---------------+---------+-----------+----------+------------------+ GSV      Full                                                            +---------+---------------+---------+-----------+----------+------------------+     Summary: RIGHT: - There is no evidence of deep vein thrombosis in the lower extremity.  - No cystic structure found in the popliteal fossa.  LEFT: - There is no evidence of deep vein thrombosis in the lower extremity. However, portions of this examination were  limited- see technologist comments above.  - No cystic structure found in the popliteal fossa.  *See table(s) above for measurements and observations. Electronically signed by Harold Barban MD on 04/29/2020 at 6:05:39 PM.    Final     EKG: not obtained  Time spent: 37mn  AToy Baker[facility] Pager [phone] If 7PM-7AM, please contact night-coverage [email or web address] Password [email or web address] 04/30/2020, 1:47 AM

## 2020-04-30 NOTE — Progress Notes (Signed)
Inpatient Diabetes Program Recommendations  AACE/ADA: New Consensus Statement on Inpatient Glycemic Control (2015)  Target Ranges:  Prepandial:   less than 140 mg/dL      Peak postprandial:   less than 180 mg/dL (1-2 hours)      Critically ill patients:  140 - 180 mg/dL   Lab Results  Component Value Date   GLUCAP 105 (H) 04/30/2020   HGBA1C 5.8 (H) 04/30/2020    Review of Glycemic Control Results for RASHADA, KLONTZ A (MRN [id number]) as of 04/30/2020 09:28  Ref. Range 04/14/2020 23:44 04/30/2020 00:13 04/30/2020 08:19  Glucose-Capillary Latest Ref Range: 70 - 99 mg/dL 128 (H) 156 (H) 105 (H)   Diabetes history: Type 2 DM Outpatient Diabetes medications: Metformin 500 mg B/L, 1000 mg Dinner, Ozempic 0.5 mg qwk Current orders for Inpatient glycemic control: Ozempic 0.5 mg Qwk, Novolog 4 units TID, Novolog 0-15 units TID, Novolog 0-5 units QHS  Inpatient Diabetes Program Recommendations:    Noted consult. Continue to hold Metformin. A1C is within goal for diabetes management and current trends are appropriate and within goal.  Noted elevated serum of 328 mg/dL, curious if erroneous result given A1C and other CBGs obtained.  At this time, concern for Novolog 4 units TID may contribute to hypoglycemia. Will check back at lunch time, but would be cautious in continuing meal coverage on this patient.   Thanks, Bronson Curb, MSN, RNC-OB Diabetes Coordinator [phone] (8a-5p)

## 2020-04-30 NOTE — Progress Notes (Signed)
Nutrition Follow-up  DOCUMENTATION CODES:   Obesity unspecified  INTERVENTION:   -ContinueEnsure Enlive po BID, each supplement provides 350 kcal and 20 grams of protein -Continue MVI with minerals daily  NUTRITION DIAGNOSIS:   Increased nutrient needs related to acute illness as evidenced by estimated needs.  Ongoing  GOAL:   Patient will meet greater than or equal to 90% of their needs  Progressing   MONITOR:   PO intake,Supplement acceptance,Skin,Weight trends,Labs,I & O's  REASON FOR ASSESSMENT:   Malnutrition Screening Tool    ASSESSMENT:   54 year old female with history of DM, neuropathy and foot drop in her left leg presents after fall. CT with odontoid fx, spinal process fx/facet fx  12/12- s/pProcedure: Halo application for stabilization of unstable odontoid fracture with fluoroscopic assistance  Reviewed I/O's: +375 ml x 24 hours and -1.8 L since 04/16/20  Pt with variable intake; documented meal completion 25-100%, averaging 50-75% of meals. Pt continues to consume Ensure supplements.   Pt awaiting bed placement for CIR/SNF.   Medications reviewed and include colace, senokot, and vitamin D.   Albumin has a half-life of 21 days and is strongly affected by stress response and inflammatory process, therefore, do not expect to see an improvement in this lab value during acute hospitalization. When a patient presents with low albumin, it is likely skewed due to the acute inflammatory response.  Unless it is suspected that patient had poor PO intake or malnutrition prior to admission, then RD should not be consulted solely for low albumin. Note that low albumin is no longer used to diagnose malnutrition; [place] uses the new malnutrition guidelines published by the American Society for Parenteral and Enteral Nutrition (A.S.P.E.N.) and the Academy of Nutrition and Dietetics (AND).     Lab Results  Component Value Date   HGBA1C 5.8 (H) 04/30/2020   PTA DM  medications are .   Labs reviewed: CBGS: 105-156 (inpatient orders for glycemic control are 0-15 units insulin aspart TID with meals, 0-5 units insulin aspart daily at bedtime, and 4 units insulin aspart TID with meals).   Diet Order:   Diet Order            Diet Carb Modified Fluid consistency: Thin; Room service appropriate? Yes  Diet effective now                 EDUCATION NEEDS:   Not appropriate for education at this time  Skin:  Skin Assessment: Reviewed RN Assessment Skin Integrity Issues:: Other (Comment),Incisions Incisions: closed head Other: upper face laceration with staples  Last BM:  04/27/20  Height:   Ht Readings from Last 1 Encounters:  04/21/20 5' 2.99" (1.6 m)    Weight:   Wt Readings from Last 1 Encounters:  04/21/20 85.7 kg    Ideal Body Weight:  52.3 kg  BMI:  Body mass index is 33.49 kg/m.  Estimated Nutritional Needs:   Kcal:  1800-1950  Protein:  85-100 grams  Fluid:  >/= 1.8 L/day    Loistine Chance, RD, LDN, [place] Registered Dietitian II Certified Diabetes Care and Education Specialist Please refer to [facility] for RD and/or RD on-call/weekend/after hours pager

## 2020-04-30 NOTE — Consult Note (Signed)
Medical Consultation   Leah Franco  YOV:[id number]  DOB: Oct 28, 1965  DOA: 04/14/2020  PCP: [name], MD    Requesting physician:   Reason for consultation: Multiple medical problems   History of Present Illness: 54 yo WF PMHx DM2, neuropathy, HLD, obesity, Gastric bypass status for obesity, long standing leg edema due to venous insufficiency,iron def anemia and thrombocytosis, chronic tachycardia, GI bleed  Patient came in the recurrent falls while walking on stairs.  First fall was in November 2 fall was on 6 December.  Resulting in odontoid fracture admitted by neurosurgery in [place] he will   Last night 3 Am her LEft leg started to get warm and red There were some abrasion on the leg Dopplers were negative for DVT Her toes have been cyanotic for the past 2 wks And chronic left drop Pulses present and no change She is having some sensory issues feeling like there is chord laying on the bed under her leg No fever.   12/22 A/O x4, patient states has had multiple surgeries on her back secondary to scoliosis.  Her LEFT foot drop and left leg swelling is chronic.  States her left leg swelling is worse post surgery than usual.   Covid vaccination; Review of Systems:  Review of Systems  Constitutional: Negative.   HENT: Negative.   Eyes: Negative.   Respiratory: Negative.   Cardiovascular: Negative.   Gastrointestinal: Negative.   Genitourinary: Negative.   Musculoskeletal: Positive for back pain, falls, joint pain and neck pain.  Skin: Negative.   Neurological: Positive for sensory change, focal weakness and headaches.  Endo/Heme/Allergies: Negative.   Psychiatric/Behavioral: Negative.       Past Medical History: Past Medical History:  Diagnosis Date  . Anemia   . Arthritis   . Back pain   . Diabetes mellitus without complication ([place])   . Fatigue   . Fibroids   . GI bleeding 03/2020  . Hemorrhoid   . Hypertension   . Mitral valve  prolapse   . MRSA (methicillin resistant Staphylococcus aureus)   . Night sweats   . Wound infection     Past Surgical History: Past Surgical History:  Procedure Laterality Date  . BACK SURGERY    . BREAST LUMPECTOMY     lft  . ESOPHAGOGASTRODUODENOSCOPY  03/12/2020   HOT HEMOSTASIS (ARGON PLASMA COAGULATION/BICAP) (N/A )  . ESOPHAGOGASTRODUODENOSCOPY (EGD) WITH PROPOFOL N/A 03/12/2020   Procedure: ESOPHAGOGASTRODUODENOSCOPY (EGD) WITH PROPOFOL;  Surgeon: Clarene Essex, MD;  Location: [facility];  Service: Endoscopy;  Laterality: N/A;  . GASTRIC BYPASS    . HALO APPLICATION N/A 74/04/8785   Procedure: HALO TRACTION APPLICATION;  Surgeon: Kary Kos, MD;  Location: [facility];  Service: Neurosurgery;  Laterality: N/A;  . HERNIA REPAIR    . HOT HEMOSTASIS N/A 03/12/2020   Procedure: HOT HEMOSTASIS (ARGON PLASMA COAGULATION/BICAP);  Surgeon: Clarene Essex, MD;  Location: [facility];  Service: Endoscopy;  Laterality: N/A;  . SPINE SURGERY     corrective spine collapsed, scoliosis and scoliosis revision  . TOTAL HIP ARTHROPLASTY  2018     Allergies:   Allergies  Allergen Reactions  . Lactose Intolerance (Gi) Nausea And Vomiting and Other (See Comments)    gassy  . Tape Rash    Reaction to most kinds of adhesive     Social History:  reports that she has never smoked. She has never used smokeless tobacco. She  reports that she does not drink alcohol and does not use drugs.   Family History: Family History  Problem Relation Age of Onset  . BRCA 1/2 Mother        BRCA2+ by default, no testing  . Breast cancer Sister 50       BRCA2+  . BRCA 1/2 Sister 1       BRCA2 +  . Pancreatic cancer Maternal Uncle        Dx in his 60s  . BRCA 1/2 Maternal Uncle        BRCA2+  . Prostate cancer Maternal Grandfather   . Breast cancer Maternal Aunt        dx in 19s  . Ovarian cancer Maternal Aunt        dx in 39s  . Esophageal cancer Maternal Uncle 62  . BRCA 1/2 Maternal Uncle         BRCA2+  . Colon cancer Maternal Uncle 44  . Breast cancer Cousin        dx in her 12s  . BRCA 1/2 Cousin        dx in her 39s; BRCA2+       Procedures/Significant Events:  12/22 vascular ultrasound ABI bilateral lower extremities;  Right: Resting right ankle-brachial index is within normal range. The right toe-brachial index is abnormal.  Left: Resting left ankle-brachial index is within normal range.The left  toe-brachial index is normal.  12/21 bilateral lower extremity ultrasound negative DVT   I have personally reviewed and interpreted all radiology studies and my findings are as above.  VENTILATOR SETTINGS:   Cultures   Antimicrobials: Anti-infectives (From admission, onward)   Start     Dose/Rate Route Frequency Ordered Stop   04/22/20 0600  ceFAZolin (ANCEF) IVPB 2g/100 mL premix  Status:  Discontinued        2 g 200 mL/hr over 30 Minutes Intravenous On call to O.R. 04/21/20 2057 04/21/20 2101   04/21/20 1445  ceFAZolin (ANCEF) IVPB 2g/100 mL premix        2 g 200 mL/hr over 30 Minutes Intravenous  Once 04/21/20 1443 04/21/20 1529       Devices    LINES / TUBES:      Continuous Infusions: . lactated ringers 10 mL/hr at 04/21/20 1454     Physical Exam: Vitals:   04/29/20 2200 04/30/20 0026 04/30/20 0427 04/30/20 0756  BP: (!) 125/100 119/90 (!) 119/99 (!) 131/103  Pulse: (!) 106 82 99 89  Resp: 20 18  17   Temp: 98 F (36.7 C) 97.8 F (36.6 C) 98.1 F (36.7 C) 97.9 F (36.6 C)  TempSrc: Oral Oral Oral Oral  SpO2: 98% 100% 98% 96%  Weight:      Height:        General: A/O x4, No acute respiratory distress.  What appears to be a large hematoma midline just above the forehead Eyes: negative scleral hemorrhage, negative anisocoria, negative icterus ENT: Negative Runny nose, negative gingival bleeding, Neck:  Negative scars, masses, torticollis, lymphadenopathy, JVD, halo collar in place Lungs: Clear to auscultation bilaterally without wheezes  or crackles Cardiovascular: Regular rate and rhythm without murmur gallop or rub normal S1 and S2 Abdomen: negative abdominal pain, nondistended, positive soft, bowel sounds, no rebound, no ascites, no appreciable mass Extremities: No significant cyanosis, clubbing, or edema bilateral lower extremities Skin: Negative rashes, lesions, ulcers Psychiatric:  Negative depression, negative anxiety, negative fatigue, negative mania  Central nervous system:  Cranial nerves II  through XII intact, tongue/uvula midline, all extremities muscle strength 5/5, sensation intact throughout,  negative dysarthria, negative expressive aphasia, negative receptive aphasia.  Data reviewed:  I have personally reviewed following labs and imaging studies Labs:  CBC: Recent Labs  Lab 04/29/20 2142  WBC 8.4  NEUTROABS 6.7  HGB 11.6*  HCT 39.0  MCV 100.3*  PLT 559*    Basic Metabolic Panel: Recent Labs  Lab 04/29/20 2142 04/30/20 0213  NA 133* 141  K 4.3 4.3  CL 96* 101  CO2 27 28  GLUCOSE 328* 81  BUN 11 11  CREATININE 0.67 0.56  CALCIUM 8.3* 9.2   GFR Estimated Creatinine Clearance: 85.3 mL/min (by C-G formula based on SCr of 0.56 mg/dL). Liver Function Tests: Recent Labs  Lab 04/29/20 2142  AST 17  ALT 23  ALKPHOS 75  BILITOT 0.4  PROT 5.6*  ALBUMIN 3.0*   No results for input(s): LIPASE, AMYLASE in the last 168 hours. No results for input(s): AMMONIA in the last 168 hours. Coagulation profile Recent Labs  Lab 04/30/20 0213  INR 1.0    Cardiac Enzymes: No results for input(s): CKTOTAL, CKMB, CKMBINDEX, TROPONINI in the last 168 hours. BNP: Invalid input(s): POCBNP CBG: Recent Labs  Lab 04/30/20 0013 04/30/20 0819  GLUCAP 156* 105*   D-Dimer No results for input(s): DDIMER in the last 72 hours. Hgb A1c Recent Labs    04/30/20 0213  HGBA1C 5.8*   Lipid Profile No results for input(s): CHOL, HDL, LDLCALC, TRIG, CHOLHDL, LDLDIRECT in the last 72 hours. Thyroid  function studies No results for input(s): TSH, T4TOTAL, T3FREE, THYROIDAB in the last 72 hours.  Invalid input(s): FREET3 Anemia work up No results for input(s): VITAMINB12, FOLATE, FERRITIN, TIBC, IRON, RETICCTPCT in the last 72 hours. Urinalysis    Component Value Date/Time   COLORURINE YELLOW 04/29/2020 2257   APPEARANCEUR HAZY (A) 04/29/2020 2257   LABSPEC 1.020 04/29/2020 2257   PHURINE 6.0 04/29/2020 2257   GLUCOSEU >=500 (A) 04/29/2020 2257   HGBUR NEGATIVE 04/29/2020 2257   BILIRUBINUR NEGATIVE 04/29/2020 2257   KETONESUR NEGATIVE 04/29/2020 2257   PROTEINUR NEGATIVE 04/29/2020 2257   UROBILINOGEN 1.0 05/12/2009 1406   NITRITE NEGATIVE 04/29/2020 2257   LEUKOCYTESUR LARGE (A) 04/29/2020 2257     Microbiology Recent Results (from the past 240 hour(s))  MRSA PCR Screening     Status: None   Collection Time: 04/29/20  1:45 AM   Specimen: Nasal Mucosa; Nasopharyngeal  Result Value Ref Range Status   MRSA by PCR NEGATIVE NEGATIVE Final    Comment:        The GeneXpert MRSA Assay (FDA approved for NASAL specimens only), is one component of a comprehensive MRSA colonization surveillance program. It is not intended to diagnose MRSA infection nor to guide or monitor treatment for MRSA infections. Performed at [facility], [place] [address], [place], [place] [address]        Inpatient Medications:   Scheduled Meds: . diphenhydrAMINE  50 mg Oral QHS   And  . acetaminophen  1,000 mg Oral QHS  . atorvastatin  10 mg Oral QPM  . bacitracin   Topical BID  . docusate sodium  100 mg Oral BID  . DULoxetine  60 mg Oral QPM  . etanercept  50 mg Subcutaneous Q Thu  . feeding supplement  237 mL Oral BID BM  . gabapentin  600 mg Oral TID  . gabapentin  800 mg Oral QHS  . heparin injection (subcutaneous)  5,000 Units Subcutaneous Q8H  . insulin aspart  0-15 Units Subcutaneous TID WC  . insulin aspart  0-5 Units Subcutaneous QHS  . insulin aspart  4 Units  Subcutaneous TID WC  . metoprolol tartrate  25 mg Oral BID  . multivitamin with minerals  1 tablet Oral Daily  . oxyCODONE  15 mg Oral Q4H  . pantoprazole  40 mg Oral BID  . Semaglutide(0.25 or 0.5MG/DOS)  0.5 mg Subcutaneous Q Thu  . senna  1 tablet Oral BID  . sucralfate  1 g Oral TID WC & HS  . Vitamin D (Ergocalciferol)  50,000 Units Oral Once per day on Mon Wed Fri Sat   Continuous Infusions: . lactated ringers 10 mL/hr at 04/21/20 1454     Radiological Exams on Admission: DG Abd 1 View  Result Date: 04/30/2020 CLINICAL DATA:  Abdominal distension EXAM: ABDOMEN - 1 VIEW COMPARISON:  None. FINDINGS: Suboptimal evaluation due to limitations on positioning and portable technique with body habitus. Visualized bowel gas pattern is unremarkable. Stool is present within the included colon. Extensive fusion construct for scoliosis. IMPRESSION: Nonobstructive bowel gas pattern. Electronically Signed   By: Macy Mis M.D.   On: 04/30/2020 08:07   VAS [place] LOWER EXTREMITY VENOUS (DVT)  Result Date: 04/29/2020  Lower Venous DVT Study Indications: Edema, Erythema, and Lt>Rt.  Limitations: Body habitus and poor ultrasound/tissue interface. Performing Technologist: Antonieta Pert RDMS, RVT  Examination Guidelines: A complete evaluation includes B-mode imaging, spectral Doppler, color Doppler, and power Doppler as needed of all accessible portions of each vessel. Bilateral testing is considered an integral part of a complete examination. Limited examinations for reoccurring indications may be performed as noted. The reflux portion of the exam is performed with the patient in reverse Trendelenburg.  +---------+---------------+---------+-----------+----------+--------------+ RIGHT    CompressibilityPhasicitySpontaneityPropertiesThrombus Aging +---------+---------------+---------+-----------+----------+--------------+ CFV      Full           Yes      Yes                                  +---------+---------------+---------+-----------+----------+--------------+ SFJ      Full                                                        +---------+---------------+---------+-----------+----------+--------------+ FV Prox  Full                                                        +---------+---------------+---------+-----------+----------+--------------+ FV Mid   Full                                                        +---------+---------------+---------+-----------+----------+--------------+ FV DistalFull                                                        +---------+---------------+---------+-----------+----------+--------------+  PFV      Full                                                        +---------+---------------+---------+-----------+----------+--------------+ POP      Full           Yes      Yes                                 +---------+---------------+---------+-----------+----------+--------------+ PTV      Full                                                        +---------+---------------+---------+-----------+----------+--------------+ PERO     Full                                                        +---------+---------------+---------+-----------+----------+--------------+ GSV      Full                                                        +---------+---------------+---------+-----------+----------+--------------+   +---------+---------------+---------+-----------+----------+------------------+ LEFT     CompressibilityPhasicitySpontaneityPropertiesThrombus Aging     +---------+---------------+---------+-----------+----------+------------------+ CFV      Full           Yes      Yes                                     +---------+---------------+---------+-----------+----------+------------------+ SFJ      Full                                                             +---------+---------------+---------+-----------+----------+------------------+ FV Prox  Full                                                            +---------+---------------+---------+-----------+----------+------------------+ FV Mid   Full                                                            +---------+---------------+---------+-----------+----------+------------------+ FV DistalFull                                                            +---------+---------------+---------+-----------+----------+------------------+  PFV      Full                                                            +---------+---------------+---------+-----------+----------+------------------+ POP      Full           Yes      Yes                                     +---------+---------------+---------+-----------+----------+------------------+ PTV      Full                                                            +---------+---------------+---------+-----------+----------+------------------+ PERO                                                  Poor visualization +---------+---------------+---------+-----------+----------+------------------+ GSV      Full                                                            +---------+---------------+---------+-----------+----------+------------------+     Summary: RIGHT: - There is no evidence of deep vein thrombosis in the lower extremity.  - No cystic structure found in the popliteal fossa.  LEFT: - There is no evidence of deep vein thrombosis in the lower extremity. However, portions of this examination were limited- see technologist comments above.  - No cystic structure found in the popliteal fossa.  *See table(s) above for measurements and observations. Electronically signed by Harold Barban MD on 04/29/2020 at 52:05:39 PM.    Final     Impression/Recommendations Active Problems:   Diabetes mellitus ([place])    Neuropathy   Lymphedema   Cervical spine fracture ([facility])   PAD (peripheral artery disease) ([facility])  Essential HTN -Strict in and out -Daily weight -Lasix IV 60 mg daily -Lisinopril 2.5 mg daily -Metoprolol 25 mg BID  LEFT leg swelling -Acute on chronic -Patient has multiple reasons for left lower leg swelling, multiple surgeries to her lower back, hypoalbuminemia, peripheral neuropathy. -ABI and lower extremity Doppler WNL see results below -TED hose to the knee -Patient unable to elevate her legs secondary to wearing a halo and limited mobility in her back secondary to multiple surgeries.  Hx PAD diagnosed at [facility] 2018 -LEFT foot with poor capillary refill and cyanosis of the toes (chronic) -PT/DP +1 bilateral  Diabetes type 2 controlled with complication/DM neuropathy  -12/22 hemoglobin A1c= 5.8 -Taking metformin 208m daily and Ozempic 0.570mweekly at home -.  NovoLog 4 units qac -Moderate SSI  Chronic tachycardia -Resolved  Lactic acidosis -Most likely multifactorial -Trend lactic acid Results for SCLIZBETH, FEIJOO (MRN [id number] of 04/30/2020 08:47  Ref. Range 04/29/2020 21:42 04/30/2020  02:13  Lactic Acid, Venous Latest Ref Range: 0.5 - 1.9 mmol/L 2.2 (HH) 3.8 (HH)   -multifactorial likely combination of known hepatic disease, use of Metformin, peripheral arterial disease For right now hold Metformin Repeat lactic acid Would avoid fluid overload as patient likely will develop worsening edema in the given hypoalbuminemia  Hypoalbuminemia  -nutritional versus hepatic.  Obtain prealbumin nutritional consult Likely contributing to generalized edema -Prealbumin WNL  Obese -KUB showed nonobstructive gas pattern    Thank you for this consultation.  Our [facility] hospitalist team will follow the patient with you.   Time Spent: 50 minutes  Nikky Duba, Geraldo Docker M.D. [facility] 04/30/2020, 8:46 AM  [phone]

## 2020-05-01 DIAGNOSIS — S12100A Unspecified displaced fracture of second cervical vertebra, initial encounter for closed fracture: Secondary | ICD-10-CM | POA: Diagnosis not present

## 2020-05-01 DIAGNOSIS — S12400A Unspecified displaced fracture of fifth cervical vertebra, initial encounter for closed fracture: Secondary | ICD-10-CM | POA: Diagnosis not present

## 2020-05-01 DIAGNOSIS — W19XXXA Unspecified fall, initial encounter: Secondary | ICD-10-CM | POA: Diagnosis not present

## 2020-05-01 DIAGNOSIS — S12001A Unspecified nondisplaced fracture of first cervical vertebra, initial encounter for closed fracture: Secondary | ICD-10-CM

## 2020-05-01 DIAGNOSIS — S129XXA Fracture of neck, unspecified, initial encounter: Secondary | ICD-10-CM | POA: Diagnosis not present

## 2020-05-01 LAB — CBC WITH DIFFERENTIAL/PLATELET
Abs Immature Granulocytes: 0.05 K/uL (ref 0.00–0.07)
Basophils Absolute: 0 K/uL (ref 0.0–0.1)
Basophils Relative: 0 %
Eosinophils Absolute: 0 K/uL (ref 0.0–0.5)
Eosinophils Relative: 1 %
HCT: 37.6 % (ref 36.0–46.0)
Hemoglobin: 11.4 g/dL — ABNORMAL LOW (ref 12.0–15.0)
Immature Granulocytes: 1 %
Lymphocytes Relative: 14 %
Lymphs Abs: 1.2 K/uL (ref 0.7–4.0)
MCH: 30.5 pg (ref 26.0–34.0)
MCHC: 30.3 g/dL (ref 30.0–36.0)
MCV: 100.5 fL — ABNORMAL HIGH (ref 80.0–100.0)
Monocytes Absolute: 0.9 K/uL (ref 0.1–1.0)
Monocytes Relative: 11 %
Neutro Abs: 6.1 K/uL (ref 1.7–7.7)
Neutrophils Relative %: 73 %
Platelets: 574 K/uL — ABNORMAL HIGH (ref 150–400)
RBC: 3.74 MIL/uL — ABNORMAL LOW (ref 3.87–5.11)
RDW: 15 % (ref 11.5–15.5)
WBC: 8.4 K/uL (ref 4.0–10.5)
nRBC: 0 % (ref 0.0–0.2)

## 2020-05-01 LAB — COMPREHENSIVE METABOLIC PANEL
ALT: 22 U/L (ref 0–44)
AST: 14 U/L — ABNORMAL LOW (ref 15–41)
Albumin: 3 g/dL — ABNORMAL LOW (ref 3.5–5.0)
Alkaline Phosphatase: 69 U/L (ref 38–126)
Anion gap: 7 (ref 5–15)
BUN: 11 mg/dL (ref 6–20)
CO2: 34 mmol/L — ABNORMAL HIGH (ref 22–32)
Calcium: 8.8 mg/dL — ABNORMAL LOW (ref 8.9–10.3)
Chloride: 99 mmol/L (ref 98–111)
Creatinine, Ser: 0.74 mg/dL (ref 0.44–1.00)
GFR, Estimated: >60 mL/min (ref >60)
Glucose, Bld: 211 mg/dL — ABNORMAL HIGH (ref 70–99)
Potassium: 4.4 mmol/L (ref 3.5–5.1)
Sodium: 140 mmol/L (ref 135–145)
Total Bilirubin: 0.2 mg/dL — ABNORMAL LOW (ref 0.3–1.2)
Total Protein: 5.7 g/dL — ABNORMAL LOW (ref 6.5–8.1)

## 2020-05-01 LAB — GLUCOSE, CAPILLARY
Glucose-Capillary: 177 mg/dL — ABNORMAL HIGH (ref 70–99)
Glucose-Capillary: 219 mg/dL — ABNORMAL HIGH (ref 70–99)
Glucose-Capillary: 114 mg/dL — ABNORMAL HIGH (ref 70–99)
Glucose-Capillary: 173 mg/dL — ABNORMAL HIGH (ref 70–99)

## 2020-05-01 LAB — PHOSPHORUS: Phosphorus: 3.5 mg/dL (ref 2.5–4.6)

## 2020-05-01 LAB — SARS CORONAVIRUS 2 BY RT PCR (HOSPITAL ORDER, PERFORMED IN ~~LOC~~ HOSPITAL LAB): SARS Coronavirus 2: NEGATIVE

## 2020-05-01 LAB — MAGNESIUM: Magnesium: 2.4 mg/dL (ref 1.7–2.4)

## 2020-05-01 NOTE — Progress Notes (Signed)
   05/01/20 1000  Clinical Encounter Type  Visited With Patient;Health care provider  Visit Type Follow-up  Referral From Nurse  Consult/Referral To Chaplain  Stress Factors  Patient Stress Factors Health changes  Family Stress Factors None identified  Chaplain tried to get volunteers and notary to patient's room before patient was transferred. Volunteers were short-handed and had an overload of patients to transport today. Chaplain informed patient that when she got to her rehab facility, she could get her AD witnessed and notarized and placed on file there. She seemed relieved that she could do that.

## 2020-05-01 NOTE — Discharge Summary (Addendum)
Physician Discharge Summary  Patient ID: Leah Franco MRN: [id number] DOB/AGE: 52-20-1967 52 y.o.  Admit date: 04/14/2020 Discharge date: 05/05/2020  Admission Diagnoses: Type III odontoid fracture  Discharge Diagnoses: Type III odontoid fracture Active Problems:   Diabetes mellitus ([place])   Neuropathy   Lymphedema   Cervical spine fracture ([facility])   PAD (peripheral artery disease) ([place])   Discharged Condition: fair  Hospital Course: Patient is a 54 year old individual whose had a complicated spinal history having had 9 surgeries in the lumbar spine in the past.  Patient also has significant cervical spondylosis and apparently sustained an injury to her neck on December 7.  This was initially treated with immobilization in a collar but when pain persisted and some lack of fixation was noted it was decided to place her in a halo vest.  She has been significantly debilitated from significant bedrest she has bilateral foot drops secondary to previous back surgery and will require comprehensive inpatient rehabilitation at this time.  She is being transferred there now  Consults: rehabilitation medicine  Significant Diagnostic Studies: None  Treatments: Placement of halo vest  Discharge Exam: Blood pressure (!) 135/95, pulse 93, temperature (!) 97.5 F (36.4 C), temperature source Oral, resp. rate 20, height 5' 2.99" (1.6 m), weight 85 kg, last menstrual period 11/04/2010, SpO2 100 %. Pin sites are clean and dry patient is in a halo vest.  Upper extremity function appears good with normal strength tone and bulk lower extremity function reveals bilateral foot drops with significant swelling in the distal lower extremities.  Full pulses are good.  Disposition: Discharge disposition: [place] Not Defined       Discharge Instructions    Call MD for:  redness, tenderness, or signs of infection (pain, swelling, redness, odor or green/yellow discharge around  incision site)   Complete by: As directed    Call MD for:  severe uncontrolled pain   Complete by: As directed    Call MD for:  temperature >100.4   Complete by: As directed    Diet - low sodium heart healthy   Complete by: As directed    Discharge wound care:   Complete by: As directed    Routine pin care   Increase activity slowly   Complete by: As directed      Allergies as of 05/05/2020      Reactions   Lactose Intolerance (gi) Nausea And Vomiting, Other (See Comments)   gassy   Tape Rash   Reaction to most kinds of adhesive      Medication List    TAKE these medications   atorvastatin 10 MG tablet Commonly known as: LIPITOR Take 10 mg by mouth every evening.   cyclobenzaprine 5 MG tablet Commonly known as: FLEXERIL Take 5 mg by mouth at bedtime as needed for muscle spasms.   denosumab 60 MG/ML Sosy injection Commonly known as: PROLIA Inject 60 mg into the skin every 6 (six) months.   diphenhydramine-acetaminophen 25-500 MG Tabs tablet Commonly known as: TYLENOL PM Take 4 tablets by mouth at bedtime.   DULoxetine 60 MG capsule Commonly known as: CYMBALTA Take 60 mg by mouth every evening.   Enbrel 50 MG/ML injection Generic drug: etanercept Inject 50 mg into the skin every Tuesday.   ergocalciferol 1.25 MG (50000 UT) capsule Commonly known as: VITAMIN D2 Take 50,000 Units by mouth See admin instructions. Take one capsule (50000 units) by mouth four times weekly in the evening - Monday, Wednesday, Friday, Saturday  gabapentin 300 MG capsule Commonly known as: NEURONTIN Take 600 mg by mouth 3 (three) times daily. At bedtime take with an 800 mg tablet for a total dose of 1400 mg at night   gabapentin 800 MG tablet Commonly known as: NEURONTIN Take 800 mg by mouth at bedtime.   metFORMIN 500 MG tablet Commonly known as: GLUCOPHAGE Take 500-1,000 mg by mouth See admin instructions. Take one tablet (500 mg) by mouth with breakfast and lunch; take two  tablets (1000 mg) at bedtime   methocarbamol 500 MG tablet Commonly known as: Robaxin Take 1 tablet (500 mg total) by mouth 4 (four) times daily.   metoprolol tartrate 25 MG tablet Commonly known as: LOPRESSOR Take 25 mg by mouth 2 (two) times daily.   oxyCODONE-acetaminophen 10-325 MG tablet Commonly known as: Percocet Take 1 tablet by mouth every 4 (four) hours as needed for pain.   Ozempic (0.25 or 0.5 MG/DOSE) 2 MG/1.5ML Sopn Generic drug: Semaglutide(0.25 or 0.5MG /DOS) Inject 0.5 mg into the skin every Tuesday.   pantoprazole 40 MG tablet Commonly known as: Protonix Take 1 tablet (40 mg total) by mouth 2 (two) times daily.   sucralfate 1 g tablet Commonly known as: Carafate Take 1 tablet (1 g total) by mouth 4 (four) times daily.            Discharge Care Instructions  (From admission, onward)         Start     Ordered   05/01/20 0000  Discharge wound care:       Comments: Routine pin care   05/01/20 1018          Follow-up Information    [facility] Follow up.   Why: where you will be going. they will do discharge planning baout when to follow up with providers.  Contact information: [facility] [place] [address] [phone]             No changes over night   Signed: Ocie Cornfield [facility] 05/05/2020, 11:50 AM

## 2020-05-01 NOTE — Progress Notes (Signed)
Report called to [place] at [facility].

## 2020-05-01 NOTE — Progress Notes (Signed)
Physical Therapy Treatment Patient Details Name: Leah Franco MRN: [id number] DOB: 1965-08-05 Today's Date: 05/01/2020    History of Present Illness 54 yo female admitted to ED on 12/6 with fall. CT and MRI cervical spine reveal acute mildly displaced type III odontoid fracture, C1 bilateral posterior arch fracture, L L3-4 widening at facet joint, mild edema ALL at dens reveals strain vs disruption. Worsening displacement, s/p Halo collar application on 40/98. + redness, swelling, TTP L calf, LE [place] negative for DVT, workup with vascular for venous and/or arterial insufficiency. 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    PT Comments    The pt is continuing to make good progress with mobility and PT goals at this time. She was able to complete transfers with use of RW and minG for safety only, but does require continued assist to set up positioning in chair due to precautions and halo. The pt was also able to make good progress with gait, but remains challenged by dynamic stability tasks and continues to require BUE support and intermittent minA to steady. The pt was able to complete lateral, backwards, and tandem walking with BUE support, and will continue to benefit from skilled PT to progress functional stability, strength, and power to improve safety and independence with mobility.     Follow Up Recommendations  CIR     Equipment Recommendations  Rolling walker with 5" wheels;3in1 (PT);Hospital bed    Recommendations for Other Services       Precautions / Restrictions Precautions Precautions: Fall;Cervical Precaution Booklet Issued: Yes (comment) Precaution Comments: spinal/cervical precautions; pt with halo Required Braces or Orthoses: Other Brace Cervical Brace: At all times Other Brace: Halo vest Restrictions Weight Bearing Restrictions: No    Mobility  Bed Mobility Overal bed mobility: Needs  Assistance             General bed mobility comments: seated in recliner  Transfers Overall transfer level: Needs assistance Equipment used: Rolling walker (2 wheeled) Transfers: Sit to/from Stand Sit to Stand: Min guard         General transfer comment: minG with RW, increased time to rise and steady. Pt reaching for UE support with static standing after rise  Ambulation/Gait Ambulation/Gait assistance: Min guard Gait Distance (Feet): 150 Feet Assistive device: Rolling walker (2 wheeled) Gait Pattern/deviations: Step-through pattern;Decreased stride length;Decreased dorsiflexion - left;Shuffle Gait velocity: 0.36 m/s Gait velocity interpretation: <1.31 ft/sec, indicative of household ambulator General Gait Details: min guard for safety, verbal cuing for upright posture, slowing gait speed for safety and as needed       Balance Overall balance assessment: Needs assistance Sitting-balance support: No upper extremity supported Sitting balance-[name] Scale: Good Sitting balance - Comments: able to sit EOB without PT support   Standing balance support: Single extremity supported;Bilateral upper extremity supported;During functional activity Standing balance-[name] Scale: Fair Standing balance comment: fair static standing, benefits from UE support with dynamic tasks             High level balance activites: Backward walking (tandem walking) High Level Balance Comments: increased need for assist, even with BUE support, to maintain stability. Slowed movement with cues for positioning            Cognition Arousal/Alertness: Awake/alert Behavior During Therapy: WFL for tasks assessed/performed Overall Cognitive Status: Within Functional Limits for tasks assessed  General Comments: very pleasant, motivated to progress      Exercises General Exercises - Lower Extremity Hip ABduction/ADduction: Strengthening;Both;20 reps  (lateral walking) Hip Flexion/Marching: AROM;Both;20 reps (while walking) Heel Raises: Strengthening;Both;15 reps;Standing    General Comments General comments (skin integrity, edema, etc.): pt eager for anticipated d/c today. reports she feels that the halo feels like it is "pushing her forward" today      Pertinent Vitals/Pain Pain Assessment: Faces Faces Pain Scale: Hurts even more Pain Location: posterior head Pain Descriptors / Indicators: Aching;Headache;Sore Pain Intervention(s): Limited activity within patient's tolerance;Monitored during session;Repositioned           PT Goals (current goals can now be found in the care plan section) Acute Rehab PT Goals Patient Stated Goal: get rehab to get stronger and independent PT Goal Formulation: With patient Time For Goal Achievement: 05/06/20 Potential to Achieve Goals: Good Progress towards PT goals: Progressing toward goals    Frequency    Min 4X/week      PT Plan Current plan remains appropriate    M-PAC PT "6 Clicks" Mobility   Outcome Measure  Help needed turning from your back to your side while in a flat bed without using bedrails?: A Little Help needed moving from lying on your back to sitting on the side of a flat bed without using bedrails?: A Little Help needed moving to and from a bed to a chair (including a wheelchair)?: A Little Help needed standing up from a chair using your arms (e.g., wheelchair or bedside chair)?: A Little Help needed to walk in hospital room?: A Little Help needed climbing 3-5 steps with a railing? : A Lot 6 Click Score: 17    End of Session Equipment Utilized During Treatment: Gait belt;Back brace Activity Tolerance: Patient tolerated treatment well;Patient limited by fatigue Patient left: with call bell/phone within reach;Other (comment);in chair (LE elevated) Nurse Communication: Mobility status PT Visit Diagnosis: Other abnormalities of gait and mobility (R26.89);Difficulty  in walking, not elsewhere classified (R26.2);Pain Pain - part of body:  (neck/back)     Time: 7989-2119 PT Time Calculation (min) (ACUTE ONLY): 39 min  Charges:  $Gait Training: 8-22 mins $Therapeutic Exercise: 8-22 mins $Therapeutic Activity: 8-22 mins                     Karma Ganja, PT, DPT   Acute Rehabilitation Department Pager #: [phone]   Otho Bellows 05/01/2020, 10:43 AM

## 2020-05-01 NOTE — Consult Note (Signed)
Medical Consultation   Leah Franco  PPI:[id number]  DOB: 1965-07-10  DOA: 04/14/2020  PCP: [name], MD    Requesting physician:   Reason for consultation: Multiple medical problems   History of Present Illness: 54 yo WF PMHx DM2, neuropathy, HLD, obesity, Gastric bypass status for obesity, long standing leg edema due to venous insufficiency,iron def anemia and thrombocytosis, chronic tachycardia, GI bleed  Patient came in the recurrent falls while walking on stairs.  First fall was in November 2 fall was on 6 December.  Resulting in odontoid fracture admitted by neurosurgery in [place] he will   Last night 3 Am her LEft leg started to get warm and red There were some abrasion on the leg Dopplers were negative for DVT Her toes have been cyanotic for the past 2 wks And chronic left drop Pulses present and no change She is having some sensory issues feeling like there is chord laying on the bed under her leg No fever.   12/22 A/O x4, patient states has had multiple surgeries on her back secondary to scoliosis.  Her LEFT foot drop and left leg swelling is chronic.  States her left leg swelling is worse post surgery than usual. 12/23 A/O x4, swelling in patient's left lower leg has decreased with placement of TED hose.  Patient somewhat upset that will now not be picked up for transport to rehabilitation until Monday.   Covid vaccination; Review of Systems:  Review of Systems  Constitutional: Negative.   HENT: Negative.   Eyes: Negative.   Respiratory: Negative.   Cardiovascular: Negative.   Gastrointestinal: Negative.   Genitourinary: Negative.   Musculoskeletal: Positive for back pain, falls, joint pain and neck pain.  Skin: Negative.   Neurological: Positive for sensory change, focal weakness and headaches.  Endo/Heme/Allergies: Negative.   Psychiatric/Behavioral: Negative.       Past Medical History: Past Medical History:  Diagnosis Date   . Anemia   . Arthritis   . Back pain   . Diabetes mellitus without complication ([place])   . Fatigue   . Fibroids   . GI bleeding 03/2020  . Hemorrhoid   . Hypertension   . Mitral valve prolapse   . MRSA (methicillin resistant Staphylococcus aureus)   . Night sweats   . Wound infection     Past Surgical History: Past Surgical History:  Procedure Laterality Date  . BACK SURGERY    . BREAST LUMPECTOMY     lft  . ESOPHAGOGASTRODUODENOSCOPY  03/12/2020   HOT HEMOSTASIS (ARGON PLASMA COAGULATION/BICAP) (N/A )  . ESOPHAGOGASTRODUODENOSCOPY (EGD) WITH PROPOFOL N/A 03/12/2020   Procedure: ESOPHAGOGASTRODUODENOSCOPY (EGD) WITH PROPOFOL;  Surgeon: Clarene Essex, MD;  Location: [facility];  Service: Endoscopy;  Laterality: N/A;  . GASTRIC BYPASS    . HALO APPLICATION N/A 11/07/1599   Procedure: HALO TRACTION APPLICATION;  Surgeon: Kary Kos, MD;  Location: [facility];  Service: Neurosurgery;  Laterality: N/A;  . HERNIA REPAIR    . HOT HEMOSTASIS N/A 03/12/2020   Procedure: HOT HEMOSTASIS (ARGON PLASMA COAGULATION/BICAP);  Surgeon: Clarene Essex, MD;  Location: [facility];  Service: Endoscopy;  Laterality: N/A;  . SPINE SURGERY     corrective spine collapsed, scoliosis and scoliosis revision  . TOTAL HIP ARTHROPLASTY  2018     Allergies:   Allergies  Allergen Reactions  . Lactose Intolerance (Gi) Nausea And Vomiting and Other (See Comments)    gassy  .  Tape Rash    Reaction to most kinds of adhesive     Social History:  reports that she has never smoked. She has never used smokeless tobacco. She reports that she does not drink alcohol and does not use drugs.   Family History: Family History  Problem Relation Age of Onset  . BRCA 1/2 Mother        BRCA2+ by default, no testing  . Breast cancer Sister 41       BRCA2+  . BRCA 1/2 Sister 31       BRCA2 +  . Pancreatic cancer Maternal Uncle        Dx in his 64s  . BRCA 1/2 Maternal Uncle        BRCA2+  . Prostate cancer  Maternal Grandfather   . Breast cancer Maternal Aunt        dx in 54s  . Ovarian cancer Maternal Aunt        dx in 2s  . Esophageal cancer Maternal Uncle 62  . BRCA 1/2 Maternal Uncle        BRCA2+  . Colon cancer Maternal Uncle 77  . Breast cancer Cousin        dx in her 88s  . BRCA 1/2 Cousin        dx in her 67s; BRCA2+       Procedures/Significant Events:  12/22 vascular ultrasound ABI bilateral lower extremities;  Right: Resting right ankle-brachial index is within normal range. The right toe-brachial index is abnormal.  Left: Resting left ankle-brachial index is within normal range.The left  toe-brachial index is normal.  12/21 bilateral lower extremity ultrasound negative DVT   I have personally reviewed and interpreted all radiology studies and my findings are as above.  VENTILATOR SETTINGS:   Cultures   Antimicrobials: Anti-infectives (From admission, onward)   Start     Dose/Rate Route Frequency Ordered Stop   04/22/20 0600  ceFAZolin (ANCEF) IVPB 2g/100 mL premix  Status:  Discontinued        2 g 200 mL/hr over 30 Minutes Intravenous On call to O.R. 04/21/20 2057 04/21/20 2101   04/21/20 1445  ceFAZolin (ANCEF) IVPB 2g/100 mL premix        2 g 200 mL/hr over 30 Minutes Intravenous  Once 04/21/20 1443 04/21/20 1529       Devices    LINES / TUBES:      Continuous Infusions: . lactated ringers 10 mL/hr at 04/21/20 1454     Physical Exam: Vitals:   05/01/20 0311 05/01/20 0348 05/01/20 0753 05/01/20 1229  BP:  (!) 144/98 (!) 125/92 104/83  Pulse:  98 78 93  Resp:  18 14 16   Temp:  98 F (36.7 C) 97.8 F (36.6 C) 97.8 F (36.6 C)  TempSrc:  Oral Oral   SpO2:  98% 100% 100%  Weight: 85.5 kg     Height:        General: A/O x4, No acute respiratory distress.  What appears to be a large hematoma midline just above the forehead Eyes: negative scleral hemorrhage, negative anisocoria, negative icterus ENT: Negative Runny nose, negative  gingival bleeding, Neck:  Negative scars, masses, torticollis, lymphadenopathy, JVD, halo collar in place Lungs: Clear to auscultation bilaterally without wheezes or crackles Cardiovascular: Regular rate and rhythm without murmur gallop or rub normal S1 and S2 Abdomen: negative abdominal pain, nondistended, positive soft, bowel sounds, no rebound, no ascites, no appreciable mass Extremities: bilateral TED hose to the  knees on~2+ edema, significantly less than on 11/22 Skin: Negative rashes, lesions, ulcers Psychiatric:  Negative depression, negative anxiety, negative fatigue, negative mania  Central nervous system:  Cranial nerves II through XII intact, tongue/uvula midline, all extremities muscle strength 5/5, sensation intact throughout,  negative dysarthria, negative expressive aphasia, negative receptive aphasia.  Data reviewed:  I have personally reviewed following labs and imaging studies Labs:  CBC: Recent Labs  Lab 04/29/20 2142 05/01/20 0205  WBC 8.4 8.4  NEUTROABS 6.7 6.1  HGB 11.6* 11.4*  HCT 39.0 37.6  MCV 100.3* 100.5*  PLT 559* 574*    Basic Metabolic Panel: Recent Labs  Lab 04/29/20 2142 04/30/20 0213 05/01/20 0205  NA 133* 141 140  K 4.3 4.3 4.4  CL 96* 101 99  CO2 27 28 34*  GLUCOSE 328* 81 211*  BUN 11 11 11   CREATININE 0.67 0.56 0.74  CALCIUM 8.3* 9.2 8.8*  MG  --   --  2.4  PHOS  --   --  3.5   GFR Estimated Creatinine Clearance: 85.2 mL/min (by C-G formula based on SCr of 0.74 mg/dL). Liver Function Tests: Recent Labs  Lab 04/29/20 2142 05/01/20 0205  AST 17 14*  ALT 23 22  ALKPHOS 75 69  BILITOT 0.4 0.2*  PROT 5.6* 5.7*  ALBUMIN 3.0* 3.0*   No results for input(s): LIPASE, AMYLASE in the last 168 hours. No results for input(s): AMMONIA in the last 168 hours. Coagulation profile Recent Labs  Lab 04/30/20 0213  INR 1.0    Cardiac Enzymes: No results for input(s): CKTOTAL, CKMB, CKMBINDEX, TROPONINI in the last 168  hours. BNP: Invalid input(s): POCBNP CBG: Recent Labs  Lab 04/30/20 1201 04/30/20 1651 04/30/20 2155 05/01/20 0749 05/01/20 1227  GLUCAP 91 110* 289* 177* 219*   D-Dimer No results for input(s): DDIMER in the last 72 hours. Hgb A1c Recent Labs    04/30/20 0213  HGBA1C 5.8*   Lipid Profile No results for input(s): CHOL, HDL, LDLCALC, TRIG, CHOLHDL, LDLDIRECT in the last 72 hours. Thyroid function studies No results for input(s): TSH, T4TOTAL, T3FREE, THYROIDAB in the last 72 hours.  Invalid input(s): FREET3 Anemia work up No results for input(s): VITAMINB12, FOLATE, FERRITIN, TIBC, IRON, RETICCTPCT in the last 72 hours. Urinalysis    Component Value Date/Time   COLORURINE YELLOW 04/29/2020 2257   APPEARANCEUR HAZY (A) 04/29/2020 2257   LABSPEC 1.020 04/29/2020 2257   PHURINE 6.0 04/29/2020 2257   GLUCOSEU >=500 (A) 04/29/2020 2257   HGBUR NEGATIVE 04/29/2020 2257   BILIRUBINUR NEGATIVE 04/29/2020 2257   KETONESUR NEGATIVE 04/29/2020 2257   PROTEINUR NEGATIVE 04/29/2020 2257   UROBILINOGEN 1.0 05/12/2009 1406   NITRITE NEGATIVE 04/29/2020 2257   LEUKOCYTESUR LARGE (A) 04/29/2020 2257     Microbiology Recent Results (from the past 240 hour(s))  MRSA PCR Screening     Status: None   Collection Time: 04/29/20  1:45 AM   Specimen: Nasal Mucosa; Nasopharyngeal  Result Value Ref Range Status   MRSA by PCR NEGATIVE NEGATIVE Final    Comment:        The GeneXpert MRSA Assay (FDA approved for NASAL specimens only), is one component of a comprehensive MRSA colonization surveillance program. It is not intended to diagnose MRSA infection nor to guide or monitor treatment for MRSA infections. Performed at [facility], [place] [address], [place], [place] [address]   SARS Coronavirus 2 by RT PCR (hospital order, performed in [facility] hospital lab) Nasopharyngeal Nasopharyngeal Swab  Status: None   Collection Time: 05/01/20 10:07 AM   Specimen:  Nasopharyngeal Swab  Result Value Ref Range Status   SARS Coronavirus 2 NEGATIVE NEGATIVE Final    Comment: (NOTE) SARS-CoV-2 target nucleic acids are NOT DETECTED.  The SARS-CoV-2 RNA is generally detectable in upper and lower respiratory specimens during the acute phase of infection. The lowest concentration of SARS-CoV-2 viral copies this assay can detect is 250 copies / mL. A negative result does not preclude SARS-CoV-2 infection and should not be used as the sole basis for treatment or other patient management decisions.  A negative result may occur with improper specimen collection / handling, submission of specimen other than nasopharyngeal swab, presence of viral mutation(s) within the areas targeted by this assay, and inadequate number of viral copies (<250 copies / mL). A negative result must be combined with clinical observations, patient history, and epidemiological information.  Fact Sheet for Patients:   StrictlyIdeas.no  Fact Sheet for Healthcare Providers: BankingDealers.co.za  This test is not yet approved or  cleared by the [place] FDA and has been authorized for detection and/or diagnosis of SARS-CoV-2 by FDA under an Emergency Use Authorization (EUA).  This EUA will remain in effect (meaning this test can be used) for the duration of the COVID-19 declaration under Section 564(b)(1) of the Act, 21 U.S.C. section 360bbb-3(b)(1), unless the authorization is terminated or revoked sooner.  Performed at [facility], [place] [address], [place], [place] [address]        Inpatient Medications:   Scheduled Meds: . diphenhydrAMINE  50 mg Oral QHS   And  . acetaminophen  1,000 mg Oral QHS  . atorvastatin  10 mg Oral QPM  . bacitracin   Topical BID  . docusate sodium  100 mg Oral BID  . DULoxetine  60 mg Oral QPM  . etanercept  50 mg Subcutaneous Q Thu  . feeding supplement  237 mL Oral BID BM  .  furosemide  60 mg Intravenous Daily  . gabapentin  600 mg Oral TID  . gabapentin  800 mg Oral QHS  . heparin injection (subcutaneous)  5,000 Units Subcutaneous Q8H  . insulin aspart  0-15 Units Subcutaneous TID WC  . insulin aspart  0-5 Units Subcutaneous QHS  . insulin aspart  4 Units Subcutaneous TID WC  . lisinopril  2.5 mg Oral Daily  . metoprolol tartrate  25 mg Oral BID  . multivitamin with minerals  1 tablet Oral Daily  . oxyCODONE  15 mg Oral Q4H  . pantoprazole  40 mg Oral BID  . Semaglutide(0.25 or 0.5MG/DOS)  0.5 mg Subcutaneous Q Thu  . senna  1 tablet Oral BID  . sucralfate  1 g Oral TID WC & HS  . Vitamin D (Ergocalciferol)  50,000 Units Oral Once per day on Mon Wed Fri Sat   Continuous Infusions: . lactated ringers 10 mL/hr at 04/21/20 1454     Radiological Exams on Admission: DG Abd 1 View  Result Date: 04/30/2020 CLINICAL DATA:  Abdominal distension EXAM: ABDOMEN - 1 VIEW COMPARISON:  None. FINDINGS: Suboptimal evaluation due to limitations on positioning and portable technique with body habitus. Visualized bowel gas pattern is unremarkable. Stool is present within the included colon. Extensive fusion construct for scoliosis. IMPRESSION: Nonobstructive bowel gas pattern. Electronically Signed   By: Macy Mis M.D.   On: 04/30/2020 08:07   VAS [place] ABI WITH/WO TBI  Result Date: 04/30/2020 LOWER EXTREMITY DOPPLER STUDY Indications: Diminished pulses.  High Risk Factors: Diabetes.  Limitations: Today's exam was limited due to patient positioning, patient unable              to lie flat and electrical interference with the ABI machine. Comparison Study: No prior studies. Performing Technologist: Carlos Levering RVT  Examination Guidelines: A complete evaluation includes at minimum, Doppler waveform signals and systolic blood pressure reading at the level of bilateral brachial, anterior tibial, and posterior tibial arteries, when vessel segments are accessible. Bilateral  testing is considered an integral part of a complete examination. Photoelectric Plethysmograph (PPG) waveforms and toe systolic pressure readings are included as required and additional duplex testing as needed. Limited examinations for reoccurring indications may be performed as noted.  ABI Findings: +---------+------------------+-----+---------+--------+ Right    Rt Pressure (mmHg)IndexWaveform Comment  +---------+------------------+-----+---------+--------+ Brachial 158                    triphasic         +---------+------------------+-----+---------+--------+ PTA      180               1.14 triphasic         +---------+------------------+-----+---------+--------+ DP       165               1.04 triphasic         +---------+------------------+-----+---------+--------+ Great Toe104               0.66                   +---------+------------------+-----+---------+--------+ +---------+------------------+-----+---------+-------+ Left     Lt Pressure (mmHg)IndexWaveform Comment +---------+------------------+-----+---------+-------+ Brachial 128                    triphasic        +---------+------------------+-----+---------+-------+ PTA      177               1.12 biphasic         +---------+------------------+-----+---------+-------+ DP       162               1.03 biphasic         +---------+------------------+-----+---------+-------+ Great Toe115               0.73                  +---------+------------------+-----+---------+-------+ +-------+-----------+-----------+------------+------------+ ABI/TBIToday's ABIToday's TBIPrevious ABIPrevious TBI +-------+-----------+-----------+------------+------------+ Right  1.14       0.66                                +-------+-----------+-----------+------------+------------+ Left   1.12       0.73                                +-------+-----------+-----------+------------+------------+   Summary: Right: Resting right ankle-brachial index is within normal range. No evidence of significant right lower extremity arterial disease. The right toe-brachial index is abnormal. Left: Resting left ankle-brachial index is within normal range. No evidence of significant left lower extremity arterial disease. The left toe-brachial index is normal.  *See table(s) above for measurements and observations.  Electronically signed by Curt Jews MD on 04/30/2020 at 8:03:06 PM.    Final    VAS [place] LOWER EXTREMITY VENOUS (DVT)  Result Date: 04/29/2020  Lower Venous DVT Study Indications: Edema, Erythema, and  Lt>Rt.  Limitations: Body habitus and poor ultrasound/tissue interface. Performing Technologist: Antonieta Pert RDMS, RVT  Examination Guidelines: A complete evaluation includes B-mode imaging, spectral Doppler, color Doppler, and power Doppler as needed of all accessible portions of each vessel. Bilateral testing is considered an integral part of a complete examination. Limited examinations for reoccurring indications may be performed as noted. The reflux portion of the exam is performed with the patient in reverse Trendelenburg.  +---------+---------------+---------+-----------+----------+--------------+ RIGHT    CompressibilityPhasicitySpontaneityPropertiesThrombus Aging +---------+---------------+---------+-----------+----------+--------------+ CFV      Full           Yes      Yes                                 +---------+---------------+---------+-----------+----------+--------------+ SFJ      Full                                                        +---------+---------------+---------+-----------+----------+--------------+ FV Prox  Full                                                        +---------+---------------+---------+-----------+----------+--------------+ FV Mid   Full                                                         +---------+---------------+---------+-----------+----------+--------------+ FV DistalFull                                                        +---------+---------------+---------+-----------+----------+--------------+ PFV      Full                                                        +---------+---------------+---------+-----------+----------+--------------+ POP      Full           Yes      Yes                                 +---------+---------------+---------+-----------+----------+--------------+ PTV      Full                                                        +---------+---------------+---------+-----------+----------+--------------+ PERO     Full                                                        +---------+---------------+---------+-----------+----------+--------------+  GSV      Full                                                        +---------+---------------+---------+-----------+----------+--------------+   +---------+---------------+---------+-----------+----------+------------------+ LEFT     CompressibilityPhasicitySpontaneityPropertiesThrombus Aging     +---------+---------------+---------+-----------+----------+------------------+ CFV      Full           Yes      Yes                                     +---------+---------------+---------+-----------+----------+------------------+ SFJ      Full                                                            +---------+---------------+---------+-----------+----------+------------------+ FV Prox  Full                                                            +---------+---------------+---------+-----------+----------+------------------+ FV Mid   Full                                                            +---------+---------------+---------+-----------+----------+------------------+ FV DistalFull                                                             +---------+---------------+---------+-----------+----------+------------------+ PFV      Full                                                            +---------+---------------+---------+-----------+----------+------------------+ POP      Full           Yes      Yes                                     +---------+---------------+---------+-----------+----------+------------------+ PTV      Full                                                            +---------+---------------+---------+-----------+----------+------------------+ PERO  Poor visualization +---------+---------------+---------+-----------+----------+------------------+ GSV      Full                                                            +---------+---------------+---------+-----------+----------+------------------+     Summary: RIGHT: - There is no evidence of deep vein thrombosis in the lower extremity.  - No cystic structure found in the popliteal fossa.  LEFT: - There is no evidence of deep vein thrombosis in the lower extremity. However, portions of this examination were limited- see technologist comments above.  - No cystic structure found in the popliteal fossa.  *See table(s) above for measurements and observations. Electronically signed by Harold Barban MD on 04/29/2020 at 63:05:39 PM.    Final     Impression/Recommendations Active Problems:   Diabetes mellitus ([place])   Neuropathy   Lymphedema   Cervical spine fracture ([facility])   PAD (peripheral artery disease) ([facility])  Essential HTN -Strict in and out +817m -Daily weight Filed Weights   04/14/20 1429 04/21/20 1446 05/01/20 0311  Weight: 85.7 kg 85.7 kg 85.5 kg  -Lasix IV 60 mg daily -Lisinopril 2.5 mg daily -Metoprolol 25 mg BID  LEFT leg swelling -Acute on chronic -Patient has multiple reasons for left lower leg swelling, multiple surgeries to her lower back, hypoalbuminemia, peripheral  neuropathy. -ABI and lower extremity Doppler WNL see results below -TED hose to the knee -Patient unable to elevate her legs secondary to wearing a halo and limited mobility in her back secondary to multiple surgeries.  Hx PAD diagnosed at [facility] 2018 -LEFT foot with poor capillary refill and cyanosis of the toes (chronic) -PT/DP +1 bilateral  Diabetes type 2 controlled with complication/DM neuropathy  -12/22 hemoglobin A1c= 5.8 -Taking metformin 20073mdaily and Ozempic 0.93m50meekly at home -.  NovoLog 4 units qac -Moderate SSI  Chronic tachycardia -Resolved  Lactic acidosis -Most likely multifactorial -Trend lactic acid Results for Leah, Franco(MRN [id number] of 04/30/2020 08:47  Ref. Range 04/29/2020 21:42 04/30/2020 02:13  Lactic Acid, Venous Latest Ref Range: 0.5 - 1.9 mmol/L 2.2 (HH) 3.8 (HH)   -multifactorial likely combination of known hepatic disease, use of Metformin, peripheral arterial disease -For right now hold Metformin -Repeat lactic acid -Would avoid fluid overload as patient likely will develop worsening edema in the given hypoalbuminemia  Hypoalbuminemia  -nutritional versus hepatic.  Obtain prealbumin nutritional consult Likely contributing to generalized edema -Prealbumin WNL  Obese -KUB showed nonobstructive gas pattern    Thank you for this consultation.  [facility] will follow the patient with you.   Time Spent: 50 minutes  Margerie Fraiser, CURGeraldo DockerD. [facility] 05/01/2020, 3:31 PM  [phone]

## 2020-05-01 NOTE — TOC Progression Note (Addendum)
Transition of Care [facility]) - Progression Note    Patient Details  Name: Leah Franco MRN: [id number] Date of Birth: [birth year]  [facility] CM/SW [place], RN Phone Number: 05/01/2020, 3:51 PM  Clinical Narrative:    Freda Munro from [facility] called, they have not received patient yet. Called [facility] talked to dispath then called back and spoke to supervisor. They have her in the queue, but are just very behind with multiple calls coming. In. They placed her at the front of the line, howwever I received a call back from Freda Munro, stating that the Doctor has now stated that the acceptance for today is nulled and the acceptance will be back on Monday. Avoidable days placed in due to this.  Will need to call Monday to Freda Munro [phone]   Expected Discharge Plan: [place] Barriers to Discharge: Continued Medical Work up  Expected Discharge Plan and Services Expected Discharge Plan: [place] In-house Referral: Clinical Social Work Discharge Planning Services: CM Consult Post Acute Care Choice: [facility] Living arrangements for the past 2 months: Single Family Home Expected Discharge Date: 05/01/20                                     Social Determinants of Health (SDOH) Interventions    Readmission Risk Interventions No flowsheet data found.

## 2020-05-01 NOTE — Progress Notes (Signed)
OT Cancellation Note  Patient Details Name: KYIA RHUDE MRN: [id number] DOB: 1966/01/31   Cancelled Treatment:    Reason Eval/Treat Not Completed: Other (comment);Patient declined, no reason specified pt reports having just received lasix, needing time to urinate, will check back as time allows for OT session.  Lanier Clam., COTA/L Acute Rehabilitation Services [phone] [place] 05/01/2020, 9:56 AM

## 2020-05-01 NOTE — TOC Transition Note (Signed)
Transition of Care [facility]) - CM/SW Discharge Note   Patient Details  Name: Leah Franco MRN: [id number] Date of Birth: Nov 12, 1965  Transition of Care [facility]) CM/SW Contact:  Verdell Carmine, RN Phone Number: 05/01/2020, 11:34 AM   Clinical Narrative:    Medical neccessity form and facesheet printed. [facility] has accepted. Rn will call report [facility] wil be called for transporrt   Final next level of care: IP Rehab Facility Barriers to Discharge: Continued Medical Work up   Patient Goals and CMS Choice Patient states their goals for this hospitalization and ongoing recovery are:: rehab CMS Medicare.gov Compare Post Acute Care list provided to:: Patient Choice offered to / list presented to : Patient  Discharge Placement                       Discharge Plan and Services In-house Referral: Clinical Social Work Discharge Planning Services: CM Consult Post Acute Care Choice: IP Rehab                               Social Determinants of Health (SDOH) Interventions     Readmission Risk Interventions No flowsheet data found.

## 2020-05-01 NOTE — Progress Notes (Signed)
Informed by Alma Friendly, RN CM that [facility] would not accept patient today d/t [facility] running behind and too late to admit patient to them. Discharge likely pushed to Monday. Patient made aware by CM. Vebral order received from Dr. Sherral Hammers to leave IV out for now.

## 2020-05-01 NOTE — TOC Transition Note (Signed)
Transition of Care [facility]) - CM/SW Discharge Note   Patient Details  Name: Leah Franco MRN: [id number] Date of Birth: [birth year]  [facility]) CM/SW Contact:  Verdell Carmine, RN Phone Number: 05/01/2020, 12:11 PM   Clinical Narrative:    Faxed over discharge summary to shiela and called [facility] for patient transport.    Final next level of care: IP Rehab Facility Barriers to Discharge: Continued Medical Work up   Patient Goals and CMS Choice Patient states their goals for this hospitalization and ongoing recovery are:: rehab CMS Medicare.gov Compare Post Acute Care list provided to:: Patient Choice offered to / list presented to : Patient  Discharge Placement                       Discharge Plan and Services In-house Referral: Clinical Social Work Discharge Planning Services: CM Consult Post Acute Care Choice: IP Rehab                               Social Determinants of Health (SDOH) Interventions     Readmission Risk Interventions No flowsheet data found.

## 2020-05-02 DIAGNOSIS — W19XXXA Unspecified fall, initial encounter: Secondary | ICD-10-CM | POA: Diagnosis not present

## 2020-05-02 DIAGNOSIS — S129XXA Fracture of neck, unspecified, initial encounter: Secondary | ICD-10-CM | POA: Diagnosis not present

## 2020-05-02 DIAGNOSIS — S12400A Unspecified displaced fracture of fifth cervical vertebra, initial encounter for closed fracture: Secondary | ICD-10-CM | POA: Diagnosis not present

## 2020-05-02 DIAGNOSIS — S12100A Unspecified displaced fracture of second cervical vertebra, initial encounter for closed fracture: Secondary | ICD-10-CM | POA: Diagnosis not present

## 2020-05-02 LAB — CBC WITH DIFFERENTIAL/PLATELET
Abs Immature Granulocytes: 0.02 10*3/uL (ref 0.00–0.07)
Basophils Absolute: 0 10*3/uL (ref 0.0–0.1)
Basophils Relative: 0 %
Eosinophils Absolute: 0.1 10*3/uL (ref 0.0–0.5)
Eosinophils Relative: 1 %
HCT: 36.2 % (ref 36.0–46.0)
Hemoglobin: 11.3 g/dL — ABNORMAL LOW (ref 12.0–15.0)
Immature Granulocytes: 0 %
Lymphocytes Relative: 16 %
Lymphs Abs: 1.1 10*3/uL (ref 0.7–4.0)
MCH: 30.8 pg (ref 26.0–34.0)
MCHC: 31.2 g/dL (ref 30.0–36.0)
MCV: 98.6 fL (ref 80.0–100.0)
Monocytes Absolute: 0.7 10*3/uL (ref 0.1–1.0)
Monocytes Relative: 10 %
Neutro Abs: 5.1 10*3/uL (ref 1.7–7.7)
Neutrophils Relative %: 73 %
Platelets: 525 10*3/uL — ABNORMAL HIGH (ref 150–400)
RBC: 3.67 MIL/uL — ABNORMAL LOW (ref 3.87–5.11)
RDW: 15.1 % (ref 11.5–15.5)
WBC: 7 10*3/uL (ref 4.0–10.5)
nRBC: 0 % (ref 0.0–0.2)

## 2020-05-02 LAB — COMPREHENSIVE METABOLIC PANEL
ALT: 25 U/L (ref 0–44)
AST: 19 U/L (ref 15–41)
Albumin: 3 g/dL — ABNORMAL LOW (ref 3.5–5.0)
Alkaline Phosphatase: 68 U/L (ref 38–126)
Anion gap: 6 (ref 5–15)
BUN: 12 mg/dL (ref 6–20)
CO2: 30 mmol/L (ref 22–32)
Calcium: 8.3 mg/dL — ABNORMAL LOW (ref 8.9–10.3)
Chloride: 101 mmol/L (ref 98–111)
Creatinine, Ser: 0.58 mg/dL (ref 0.44–1.00)
GFR, Estimated: >60 mL/min (ref >60)
Glucose, Bld: 163 mg/dL — ABNORMAL HIGH (ref 70–99)
Potassium: 3.7 mmol/L (ref 3.5–5.1)
Sodium: 137 mmol/L (ref 135–145)
Total Bilirubin: 0.2 mg/dL — ABNORMAL LOW (ref 0.3–1.2)
Total Protein: 5.6 g/dL — ABNORMAL LOW (ref 6.5–8.1)

## 2020-05-02 LAB — LACTIC ACID, PLASMA: Lactic Acid, Venous: 0.8 mmol/L (ref 0.5–1.9)

## 2020-05-02 LAB — GLUCOSE, CAPILLARY
Glucose-Capillary: 139 mg/dL — ABNORMAL HIGH (ref 70–99)
Glucose-Capillary: 284 mg/dL — ABNORMAL HIGH (ref 70–99)
Glucose-Capillary: 108 mg/dL — ABNORMAL HIGH (ref 70–99)
Glucose-Capillary: 56 mg/dL — ABNORMAL LOW (ref 70–99)
Glucose-Capillary: 58 mg/dL — ABNORMAL LOW (ref 70–99)
Glucose-Capillary: 220 mg/dL — ABNORMAL HIGH (ref 70–99)

## 2020-05-02 LAB — MAGNESIUM: Magnesium: 2.3 mg/dL (ref 1.7–2.4)

## 2020-05-02 LAB — PHOSPHORUS: Phosphorus: 3.3 mg/dL (ref 2.5–4.6)

## 2020-05-02 NOTE — Consult Note (Signed)
Medical Consultation   Leah Franco  VSY:[id number]  DOB: [birth year]  DOA: 04/14/2020  PCP: [name], MD    Requesting physician:   Reason for consultation: Multiple medical problems   History of Present Illness: 54 yo WF PMHx DM2, neuropathy, HLD, obesity, Gastric bypass status for obesity, long standing leg edema due to venous insufficiency,iron def anemia and thrombocytosis, chronic tachycardia, GI bleed  Patient came in the recurrent falls while walking on stairs.  First fall was in November 2 fall was on 6 December.  Resulting in odontoid fracture admitted by neurosurgery in [place] he will   Last night 3 Am her LEft leg started to get warm and red There were some abrasion on the leg Dopplers were negative for DVT Her toes have been cyanotic for the past 2 wks And chronic left drop Pulses present and no change She is having some sensory issues feeling like there is chord laying on the bed under her leg No fever.   12/22 A/O x4, patient states has had multiple surgeries on her back secondary to scoliosis.  Her LEFT foot drop and left leg swelling is chronic.  States her left leg swelling is worse post surgery than usual. 12/23 A/O x4, swelling in patient's left lower leg has decreased with placement of TED hose.  Patient somewhat upset that will now not be picked up for transport to rehabilitation until Monday. 12/24 afebrile overnight, patient sitting comfortably in chair, edema in left lower leg has almost completely resolved   Covid vaccination; Review of Systems:  Review of Systems  Constitutional: Negative.   HENT: Negative.   Eyes: Negative.   Respiratory: Negative.   Cardiovascular: Negative.   Gastrointestinal: Negative.   Genitourinary: Negative.   Musculoskeletal: Positive for back pain, falls, joint pain and neck pain.  Skin: Negative.   Neurological: Positive for sensory change, focal weakness and headaches.  Endo/Heme/Allergies:  Negative.   Psychiatric/Behavioral: Negative.       Past Medical History: Past Medical History:  Diagnosis Date  . Anemia   . Arthritis   . Back pain   . Diabetes mellitus without complication ([place])   . Fatigue   . Fibroids   . GI bleeding 03/2020  . Hemorrhoid   . Hypertension   . Mitral valve prolapse   . MRSA (methicillin resistant Staphylococcus aureus)   . Night sweats   . Wound infection     Past Surgical History: Past Surgical History:  Procedure Laterality Date  . BACK SURGERY    . BREAST LUMPECTOMY     lft  . ESOPHAGOGASTRODUODENOSCOPY  03/12/2020   HOT HEMOSTASIS (ARGON PLASMA COAGULATION/BICAP) (N/A )  . ESOPHAGOGASTRODUODENOSCOPY (EGD) WITH PROPOFOL N/A 03/12/2020   Procedure: ESOPHAGOGASTRODUODENOSCOPY (EGD) WITH PROPOFOL;  Surgeon: Clarene Essex, MD;  Location: [place];  Service: Endoscopy;  Laterality: N/A;  . GASTRIC BYPASS    . HALO APPLICATION N/A 75/30/1040   Procedure: HALO TRACTION APPLICATION;  Surgeon: Kary Kos, MD;  Location: [place];  Service: Neurosurgery;  Laterality: N/A;  . HERNIA REPAIR    . HOT HEMOSTASIS N/A 03/12/2020   Procedure: HOT HEMOSTASIS (ARGON PLASMA COAGULATION/BICAP);  Surgeon: Clarene Essex, MD;  Location: [place];  Service: Endoscopy;  Laterality: N/A;  . SPINE SURGERY     corrective spine collapsed, scoliosis and scoliosis revision  . TOTAL HIP ARTHROPLASTY  2018     Allergies:   Allergies  Allergen  Reactions  . Lactose Intolerance (Gi) Nausea And Vomiting and Other (See Comments)    gassy  . Tape Rash    Reaction to most kinds of adhesive     Social History:  reports that she has never smoked. She has never used smokeless tobacco. She reports that she does not drink alcohol and does not use drugs.   Family History: Family History  Problem Relation Age of Onset  . BRCA 1/2 Mother        BRCA2+ by default, no testing  . Breast cancer Sister 74       BRCA2+  . BRCA 1/2 Sister 47       BRCA2 +  .  Pancreatic cancer Maternal Uncle        Dx in his 35s  . BRCA 1/2 Maternal Uncle        BRCA2+  . Prostate cancer Maternal Grandfather   . Breast cancer Maternal Aunt        dx in 61s  . Ovarian cancer Maternal Aunt        dx in 68s  . Esophageal cancer Maternal Uncle 62  . BRCA 1/2 Maternal Uncle        BRCA2+  . Colon cancer Maternal Uncle 31  . Breast cancer Cousin        dx in her 23s  . BRCA 1/2 Cousin        dx in her 24s; BRCA2+       Procedures/Significant Events:  12/22 vascular ultrasound ABI bilateral lower extremities;  Right: Resting right ankle-brachial index is within normal range. The right toe-brachial index is abnormal.  Left: Resting left ankle-brachial index is within normal range.The left  toe-brachial index is normal.  12/21 bilateral lower extremity ultrasound negative DVT   I have personally reviewed and interpreted all radiology studies and my findings are as above.  VENTILATOR SETTINGS:   Cultures 12/6 SARS coronavirus negative 12/6 influenza A/B negative   Antimicrobials: Anti-infectives (From admission, onward)   Start     Dose/Rate Route Frequency Ordered Stop   04/22/20 0600  ceFAZolin (ANCEF) IVPB 2g/100 mL premix  Status:  Discontinued        2 g 200 mL/hr over 30 Minutes Intravenous On call to O.R. 04/21/20 2057 04/21/20 2101   04/21/20 1445  ceFAZolin (ANCEF) IVPB 2g/100 mL premix        2 g 200 mL/hr over 30 Minutes Intravenous  Once 04/21/20 1443 04/21/20 1529       Devices    LINES / TUBES:      Continuous Infusions: . lactated ringers 10 mL/hr at 04/21/20 1454     Physical Exam: Vitals:   05/01/20 1953 05/01/20 2310 05/02/20 0500 05/02/20 0745  BP: 112/77 (!) 126/109 (!) 112/91 119/83  Pulse: 99 91 89 92  Resp:  _0 Temp: 98.1 F (36.7 C) 98.3 F (36.8 C) 97.8 F (36.6 C) 97.6 F (36.4 C)  TempSrc:  Oral Oral   SpO2: 100% 100% 97% 100%  Weight:      Height:        General: A/O x4, No acute  respiratory distress.  What appears to be a large hematoma midline just above the forehead Eyes: negative scleral hemorrhage, negative anisocoria, negative icterus ENT: Negative Runny nose, negative gingival bleeding, Neck:  Negative scars, masses, torticollis, lymphadenopathy, JVD, halo collar in place Lungs: Clear to auscultation bilaterally without wheezes or crackles Cardiovascular: Regular rate and rhythm without murmur  gallop or rub normal S1 and S2 Abdomen: negative abdominal pain, nondistended, positive soft, bowel sounds, no rebound, no ascites, no appreciable mass Extremities: bilateral TED hose to the knees on~1+ edema, significantly less than on 11/23 Skin: Negative rashes, lesions, ulcers Psychiatric:  Negative depression, negative anxiety, negative fatigue, negative mania  Central nervous system:  Cranial nerves II through XII intact, tongue/uvula midline, all extremities muscle strength 5/5, sensation intact throughout,  negative dysarthria, negative expressive aphasia, negative receptive aphasia.  Data reviewed:  I have personally reviewed following labs and imaging studies Labs:  CBC: Recent Labs  Lab 04/29/20 2142 05/01/20 0205 05/02/20 0517  WBC 8.4 8.4 7.0  NEUTROABS 6.7 6.1 5.1  HGB 11.6* 11.4* 11.3*  HCT 39.0 37.6 36.2  MCV 100.3* 100.5* 98.6  PLT 559* 574* 525*    Basic Metabolic Panel: Recent Labs  Lab 04/29/20 2142 04/30/20 0213 05/01/20 0205 05/02/20 0517  NA 133* 141 140 137  K 4.3 4.3 4.4 3.7  CL 96* 101 99 101  CO2 27 28 34* 30  GLUCOSE 328* 81 211* 163*  BUN _0 CREATININE 0.67 0.56 0.74 0.58  CALCIUM 8.3* 9.2 8.8* 8.3*  MG  --   --  2.4 2.3  PHOS  --   --  3.5 3.3   GFR Estimated Creatinine Clearance: 85.2 mL/min (by C-G formula based on SCr of 0.58 mg/dL). Liver Function Tests: Recent Labs  Lab 04/29/20 2142 05/01/20 0205 05/02/20 0517  AST 17 14* 19  ALT _1 ALKPHOS 75 69 68  BILITOT 0.4 0.2* 0.2*  PROT 5.6*  5.7* 5.6*  ALBUMIN 3.0* 3.0* 3.0*   No results for input(s): LIPASE, AMYLASE in the last 168 hours. No results for input(s): AMMONIA in the last 168 hours. Coagulation profile Recent Labs  Lab 04/30/20 0213  INR 1.0    Cardiac Enzymes: No results for input(s): CKTOTAL, CKMB, CKMBINDEX, TROPONINI in the last 168 hours. BNP: Invalid input(s): POCBNP CBG: Recent Labs  Lab 05/01/20 0749 05/01/20 1227 05/01/20 1614 05/01/20 2144 05/02/20 0742  GLUCAP 177* 219* 114* 173* 139*   D-Dimer No results for input(s): DDIMER in the last 72 hours. Hgb A1c Recent Labs    04/30/20 0213  HGBA1C 5.8*   Lipid Profile No results for input(s): CHOL, HDL, LDLCALC, TRIG, CHOLHDL, LDLDIRECT in the last 72 hours. Thyroid function studies No results for input(s): TSH, T4TOTAL, T3FREE, THYROIDAB in the last 72 hours.  Invalid input(s): FREET3 Anemia work up No results for input(s): VITAMINB12, FOLATE, FERRITIN, TIBC, IRON, RETICCTPCT in the last 72 hours. Urinalysis    Component Value Date/Time   COLORURINE YELLOW 04/29/2020 2257   APPEARANCEUR HAZY (A) 04/29/2020 2257   LABSPEC 1.020 04/29/2020 2257   PHURINE 6.0 04/29/2020 2257   GLUCOSEU >=500 (A) 04/29/2020 2257   HGBUR NEGATIVE 04/29/2020 2257   BILIRUBINUR NEGATIVE 04/29/2020 2257   KETONESUR NEGATIVE 04/29/2020 2257   PROTEINUR NEGATIVE 04/29/2020 2257   UROBILINOGEN 1.0 05/12/2009 1406   NITRITE NEGATIVE 04/29/2020 2257   LEUKOCYTESUR LARGE (A) 04/29/2020 2257     Microbiology Recent Results (from the past 240 hour(s))  MRSA PCR Screening     Status: None   Collection Time: 04/29/20  1:45 AM   Specimen: Nasal Mucosa; Nasopharyngeal  Result Value Ref Range Status   MRSA by PCR NEGATIVE NEGATIVE Final    Comment:        The GeneXpert MRSA Assay (FDA approved for NASAL specimens only), is one  component of a comprehensive MRSA colonization surveillance program. It is not intended to diagnose MRSA infection nor to  guide or monitor treatment for MRSA infections. Performed at [facility], [place] [address] [place], [place] [address]   SARS Coronavirus 2 by RT PCR (hospital order, performed in [facility] hospital lab) Nasopharyngeal Nasopharyngeal Swab     Status: None   Collection Time: 05/01/20 10:07 AM   Specimen: Nasopharyngeal Swab  Result Value Ref Range Status   SARS Coronavirus 2 NEGATIVE NEGATIVE Final    Comment: (NOTE) SARS-CoV-2 target nucleic acids are NOT DETECTED.  The SARS-CoV-2 RNA is generally detectable in upper and lower respiratory specimens during the acute phase of infection. The lowest concentration of SARS-CoV-2 viral copies this assay can detect is 250 copies / mL. A negative result does not preclude SARS-CoV-2 infection and should not be used as the sole basis for treatment or other patient management decisions.  A negative result may occur with improper specimen collection / handling, submission of specimen other than nasopharyngeal swab, presence of viral mutation(s) within the areas targeted by this assay, and inadequate number of viral copies (<250 copies / mL). A negative result must be combined with clinical observations, patient history, and epidemiological information.  Fact Sheet for Patients:   StrictlyIdeas.no  Fact Sheet for Healthcare Providers: BankingDealers.co.za  This test is not yet approved or  cleared by the [place] FDA and has been authorized for detection and/or diagnosis of SARS-CoV-2 by FDA under an Emergency Use Authorization (EUA).  This EUA will remain in effect (meaning this test can be used) for the duration of the COVID-19 declaration under Section 564(b)(1) of the Act, 21 U.S.C. section 360bbb-3(b)(1), unless the authorization is terminated or revoked sooner.  Performed at [facility], [place] [address] [place], [place] [address]        Inpatient Medications:    Scheduled Meds: . diphenhydrAMINE  50 mg Oral QHS   And  . acetaminophen  1,000 mg Oral QHS  . atorvastatin  10 mg Oral QPM  . bacitracin   Topical BID  . docusate sodium  100 mg Oral BID  . DULoxetine  60 mg Oral QPM  . etanercept  50 mg Subcutaneous Q Thu  . feeding supplement  237 mL Oral BID BM  . furosemide  60 mg Intravenous Daily  . gabapentin  600 mg Oral TID  . gabapentin  800 mg Oral QHS  . heparin injection (subcutaneous)  5,000 Units Subcutaneous Q8H  . insulin aspart  0-15 Units Subcutaneous TID WC  . insulin aspart  0-5 Units Subcutaneous QHS  . insulin aspart  4 Units Subcutaneous TID WC  . lisinopril  2.5 mg Oral Daily  . metoprolol tartrate  25 mg Oral BID  . multivitamin with minerals  1 tablet Oral Daily  . oxyCODONE  15 mg Oral Q4H  . pantoprazole  40 mg Oral BID  . Semaglutide(0.25 or 0.5MG/DOS)  0.5 mg Subcutaneous Q Thu  . senna  1 tablet Oral BID  . sucralfate  1 g Oral TID WC & HS  . Vitamin D (Ergocalciferol)  50,000 Units Oral Once per day on Mon Wed Fri Sat   Continuous Infusions: . lactated ringers 10 mL/hr at 04/21/20 1454     Radiological Exams on Admission: VAS [place] ABI WITH/WO TBI  Result Date: 04/30/2020 LOWER EXTREMITY DOPPLER STUDY Indications: Diminished pulses. High Risk Factors: Diabetes.  Limitations: Today's exam was limited due to patient  positioning, patient unable              to lie flat and electrical interference with the ABI machine. Comparison Study: No prior studies. Performing Technologist: Carlos Levering RVT  Examination Guidelines: A complete evaluation includes at minimum, Doppler waveform signals and systolic blood pressure reading at the level of bilateral brachial, anterior tibial, and posterior tibial arteries, when vessel segments are accessible. Bilateral testing is considered an integral part of a complete examination. Photoelectric Plethysmograph (PPG) waveforms and toe systolic pressure readings are included as required  and additional duplex testing as needed. Limited examinations for reoccurring indications may be performed as noted.  ABI Findings: +---------+------------------+-----+---------+--------+ Right    Rt Pressure (mmHg)IndexWaveform Comment  +---------+------------------+-----+---------+--------+ Brachial 158                    triphasic         +---------+------------------+-----+---------+--------+ PTA      180               1.14 triphasic         +---------+------------------+-----+---------+--------+ DP       165               1.04 triphasic         +---------+------------------+-----+---------+--------+ Great Toe104               0.66                   +---------+------------------+-----+---------+--------+ +---------+------------------+-----+---------+-------+ Left     Lt Pressure (mmHg)IndexWaveform Comment +---------+------------------+-----+---------+-------+ Brachial 128                    triphasic        +---------+------------------+-----+---------+-------+ PTA      177               1.12 biphasic         +---------+------------------+-----+---------+-------+ DP       162               1.03 biphasic         +---------+------------------+-----+---------+-------+ Great Toe115               0.73                  +---------+------------------+-----+---------+-------+ +-------+-----------+-----------+------------+------------+ ABI/TBIToday's ABIToday's TBIPrevious ABIPrevious TBI +-------+-----------+-----------+------------+------------+ Right  1.14       0.66                                +-------+-----------+-----------+------------+------------+ Left   1.12       0.73                                +-------+-----------+-----------+------------+------------+  Summary: Right: Resting right ankle-brachial index is within normal range. No evidence of significant right lower extremity arterial disease. The right toe-brachial index is  abnormal. Left: Resting left ankle-brachial index is within normal range. No evidence of significant left lower extremity arterial disease. The left toe-brachial index is normal.  *See table(s) above for measurements and observations.  Electronically signed by Curt Jews MD on 04/30/2020 at 8:03:06 PM.    Final     Impression/Recommendations Active Problems:   Diabetes mellitus ([place])   Neuropathy   Lymphedema   Cervical spine fracture ([facility])   PAD (peripheral artery disease) ([facility])  Essential HTN -Strict  in and out +2.6 L -Daily weight Filed Weights   04/14/20 1429 04/21/20 1446 05/01/20 0311  Weight: 85.7 kg 85.7 kg 85.5 kg  -Lasix IV 60 mg daily -Lisinopril 2.5 mg daily -Metoprolol 25 mg BID  LEFT leg swelling -Acute on chronic -Patient has multiple reasons for left lower leg swelling, multiple surgeries to her lower back, hypoalbuminemia, peripheral neuropathy. -ABI and lower extremity Doppler WNL see results below -TED hose to the knee -Patient unable to elevate her legs secondary to wearing a halo and limited mobility in her back secondary to multiple surgeries.  Hx PAD diagnosed at [facility] 2018 -LEFT foot with poor capillary refill and cyanosis of the toes (chronic) -PT/DP +1 bilateral  Diabetes type 2 controlled with complication/DM neuropathy  -12/22 hemoglobin A1c= 5.8 -Taking metformin 2095m daily and Ozempic 0.547mweekly at home -.  NovoLog 4 units qac -Moderate SSI  Chronic tachycardia -Resolved  Lactic acidosis -Most likely multifactorial -Trend lactic acid Results for SCRAENAH, MURLEY (MRN [id number] of 04/30/2020 08:47  Ref. Range 04/29/2020 21:42 04/30/2020 02:13  Lactic Acid, Venous Latest Ref Range: 0.5 - 1.9 mmol/L 2.2 (HH) 3.8 (HH)   -multifactorial likely combination of known hepatic disease, use of Metformin, peripheral arterial disease -For right now hold Metformin -Repeat lactic acid -Would avoid fluid overload as patient likely will develop  worsening edema in the given hypoalbuminemia  Hypoalbuminemia  -nutritional versus hepatic.  Obtain prealbumin nutritional consult Likely contributing to generalized edema -Prealbumin WNL  Obese -KUB showed nonobstructive gas pattern    Thank you for this consultation.  [facility] team will follow the patient with you.   Time Spent: 50 minutes  [place] Volante, CUGeraldo Docker.D. [facility] 05/02/2020, 9:02 AM  [phone]

## 2020-05-02 NOTE — Progress Notes (Signed)
Providing Compassionate, Quality Care - Together   Subjective: Patient reports some "popping" in her neck that is not painful. She is requesting Biofreeze for some trapezius soreness she is experiencing.  Objective: Vital signs in last 24 hours: Temp:  [97.6 F (36.4 C)-98.3 F (36.8 C)] 97.6 F (36.4 C) (12/24 0745) Pulse Rate:  [89-99] 92 (12/24 0745) Resp:  [16-18] 16 (12/24 0745) BP: (104-126)/(77-109) 119/83 (12/24 0745) SpO2:  [97 %-100 %] 100 % (12/24 0745)  Intake/Output from previous day: 12/23 0701 - 12/24 0700 In: 240 [P.O.:240] Out: -  Intake/Output this shift: No intake/output data recorded.  Alert and oriented x 4 PERRLA CN II-XII grossly intact MAE, chronic L foot drop Halo in good position, pin sites unremarkable     Lab Results: Recent Labs    05/01/20 0205 05/02/20 0517  WBC 8.4 7.0  HGB 11.4* 11.3*  HCT 37.6 36.2  PLT 574* 525*   BMET Recent Labs    05/01/20 0205 05/02/20 0517  NA 140 137  K 4.4 3.7  CL 99 101  CO2 34* 30  GLUCOSE 211* 163*  BUN 11 12  CREATININE 0.74 0.58  CALCIUM 8.8* 8.3*    Studies/Results: VAS [place] ABI WITH/WO TBI  Result Date: 04/30/2020 LOWER EXTREMITY DOPPLER STUDY Indications: Diminished pulses. High Risk Factors: Diabetes.  Limitations: Today's exam was limited due to patient positioning, patient unable              to lie flat and electrical interference with the ABI machine. Comparison Study: No prior studies. Performing Technologist: Carlos Levering RVT  Examination Guidelines: A complete evaluation includes at minimum, Doppler waveform signals and systolic blood pressure reading at the level of bilateral brachial, anterior tibial, and posterior tibial arteries, when vessel segments are accessible. Bilateral testing is considered an integral part of a complete examination. Photoelectric Plethysmograph (PPG) waveforms and toe systolic pressure readings are included as required and additional duplex testing as  needed. Limited examinations for reoccurring indications may be performed as noted.  ABI Findings: +---------+------------------+-----+---------+--------+ Right    Rt Pressure (mmHg)IndexWaveform Comment  +---------+------------------+-----+---------+--------+ Brachial 158                    triphasic         +---------+------------------+-----+---------+--------+ PTA      180               1.14 triphasic         +---------+------------------+-----+---------+--------+ DP       165               1.04 triphasic         +---------+------------------+-----+---------+--------+ Great Toe104               0.66                   +---------+------------------+-----+---------+--------+ +---------+------------------+-----+---------+-------+ Left     Lt Pressure (mmHg)IndexWaveform Comment +---------+------------------+-----+---------+-------+ Brachial 128                    triphasic        +---------+------------------+-----+---------+-------+ PTA      177               1.12 biphasic         +---------+------------------+-----+---------+-------+ DP       162               1.03 biphasic         +---------+------------------+-----+---------+-------+ [place]  Toe115               0.73                  +---------+------------------+-----+---------+-------+ +-------+-----------+-----------+------------+------------+ ABI/TBIToday's ABIToday's TBIPrevious ABIPrevious TBI +-------+-----------+-----------+------------+------------+ Right  1.14       0.66                                +-------+-----------+-----------+------------+------------+ Left   1.12       0.73                                +-------+-----------+-----------+------------+------------+  Summary: Right: Resting right ankle-brachial index is within normal range. No evidence of significant right lower extremity arterial disease. The right toe-brachial index is abnormal. Left: Resting left  ankle-brachial index is within normal range. No evidence of significant left lower extremity arterial disease. The left toe-brachial index is normal.  *See table(s) above for measurements and observations.  Electronically signed by Curt Jews MD on 04/30/2020 at 8:03:06 PM.    Final     Assessment/Plan: Patient is 11 days status post halo placement for C2 fracture by Dr. Saintclair Halsted. She is recovering well.   LOS: 17 days    -Due to transportation issues, discharge to SNF postponed to Monday -Hospital does not carry Biofreeze. Patient will have her sister bring some from home.   Viona Gilmore, DNP, AGNP-C Nurse Practitioner  [facility] [place] [address], [place], [place] [address] P: [phone]    F: [phone]  05/02/2020, 8:34 AM

## 2020-05-03 DIAGNOSIS — W19XXXA Unspecified fall, initial encounter: Secondary | ICD-10-CM | POA: Diagnosis not present

## 2020-05-03 DIAGNOSIS — S12100A Unspecified displaced fracture of second cervical vertebra, initial encounter for closed fracture: Secondary | ICD-10-CM | POA: Diagnosis not present

## 2020-05-03 DIAGNOSIS — S129XXA Fracture of neck, unspecified, initial encounter: Secondary | ICD-10-CM | POA: Diagnosis not present

## 2020-05-03 DIAGNOSIS — S12400A Unspecified displaced fracture of fifth cervical vertebra, initial encounter for closed fracture: Secondary | ICD-10-CM | POA: Diagnosis not present

## 2020-05-03 LAB — CBC WITH DIFFERENTIAL/PLATELET
Abs Immature Granulocytes: 0.03 10*3/uL (ref 0.00–0.07)
Basophils Absolute: 0 10*3/uL (ref 0.0–0.1)
Basophils Relative: 0 %
Eosinophils Absolute: 0.1 10*3/uL (ref 0.0–0.5)
Eosinophils Relative: 1 %
HCT: 36.9 % (ref 36.0–46.0)
Hemoglobin: 11.6 g/dL — ABNORMAL LOW (ref 12.0–15.0)
Immature Granulocytes: 0 %
Lymphocytes Relative: 9 %
Lymphs Abs: 0.9 10*3/uL (ref 0.7–4.0)
MCH: 31 pg (ref 26.0–34.0)
MCHC: 31.4 g/dL (ref 30.0–36.0)
MCV: 98.7 fL (ref 80.0–100.0)
Monocytes Absolute: 0.8 10*3/uL (ref 0.1–1.0)
Monocytes Relative: 9 %
Neutro Abs: 7.8 10*3/uL — ABNORMAL HIGH (ref 1.7–7.7)
Neutrophils Relative %: 81 %
Platelets: 542 10*3/uL — ABNORMAL HIGH (ref 150–400)
RBC: 3.74 MIL/uL — ABNORMAL LOW (ref 3.87–5.11)
RDW: 15.3 % (ref 11.5–15.5)
WBC: 9.7 10*3/uL (ref 4.0–10.5)
nRBC: 0 % (ref 0.0–0.2)

## 2020-05-03 LAB — COMPREHENSIVE METABOLIC PANEL
ALT: 25 U/L (ref 0–44)
AST: 20 U/L (ref 15–41)
Albumin: 3 g/dL — ABNORMAL LOW (ref 3.5–5.0)
Alkaline Phosphatase: 73 U/L (ref 38–126)
Anion gap: 9 (ref 5–15)
BUN: 14 mg/dL (ref 6–20)
CO2: 29 mmol/L (ref 22–32)
Calcium: 8.7 mg/dL — ABNORMAL LOW (ref 8.9–10.3)
Chloride: 98 mmol/L (ref 98–111)
Creatinine, Ser: 0.72 mg/dL (ref 0.44–1.00)
GFR, Estimated: >60 mL/min (ref >60)
Glucose, Bld: 159 mg/dL — ABNORMAL HIGH (ref 70–99)
Potassium: 4 mmol/L (ref 3.5–5.1)
Sodium: 136 mmol/L (ref 135–145)
Total Bilirubin: 0.3 mg/dL (ref 0.3–1.2)
Total Protein: 5.7 g/dL — ABNORMAL LOW (ref 6.5–8.1)

## 2020-05-03 LAB — GLUCOSE, CAPILLARY
Glucose-Capillary: 100 mg/dL — ABNORMAL HIGH (ref 70–99)
Glucose-Capillary: 214 mg/dL — ABNORMAL HIGH (ref 70–99)
Glucose-Capillary: 125 mg/dL — ABNORMAL HIGH (ref 70–99)
Glucose-Capillary: 259 mg/dL — ABNORMAL HIGH (ref 70–99)
Glucose-Capillary: 185 mg/dL — ABNORMAL HIGH (ref 70–99)
Glucose-Capillary: 328 mg/dL — ABNORMAL HIGH (ref 70–99)
Glucose-Capillary: 270 mg/dL — ABNORMAL HIGH (ref 70–99)

## 2020-05-03 LAB — PHOSPHORUS: Phosphorus: 4 mg/dL (ref 2.5–4.6)

## 2020-05-03 LAB — MAGNESIUM: Magnesium: 2.5 mg/dL — ABNORMAL HIGH (ref 1.7–2.4)

## 2020-05-03 MED ORDER — INSULIN ASPART 100 UNIT/ML ~~LOC~~ SOLN: 0.0000 [IU] | Freq: Three times a day (TID) | SUBCUTANEOUS | Status: DC

## 2020-05-03 MED ORDER — INSULIN ASPART 100 UNIT/ML ~~LOC~~ SOLN
2.0000 [IU] | Freq: Three times a day (TID) | SUBCUTANEOUS | Status: DC
Start: 2020-05-03 — End: 2020-05-05
  Administered 2020-05-03 – 2020-05-05 (×5): 2 [IU] via SUBCUTANEOUS

## 2020-05-03 NOTE — Progress Notes (Signed)
BP 117/85 (BP Location: Left Arm)   Pulse 97   Temp 98.2 F (36.8 C) (Oral)   Resp 17   Ht 5' 2.99" (1.6 m)   Wt 85.5 kg   LMP 11/04/2010   SpO2 100%   BMI 33.40 kg/m  Alert and oriented x 4 Pin sites clean, no signs of infection Normal strength in all extremities Doing well

## 2020-05-03 NOTE — Consult Note (Addendum)
Medical Consultation   Leah Franco  HQI:[id number]  DOB: 05-21-1965  DOA: 04/14/2020  PCP: [name], MD    Requesting physician:   Reason for consultation: Multiple medical problems   History of Present Illness: 54 yo WF PMHx DM2, neuropathy, HLD, obesity, Gastric bypass status for obesity, long standing leg edema due to venous insufficiency,iron def anemia and thrombocytosis, chronic tachycardia, GI bleed  Patient came in the recurrent falls while walking on stairs.  First fall was in November 2 fall was on 6 December.  Resulting in odontoid fracture admitted by neurosurgery in [place] he will   Last night 3 Am her LEft leg started to get warm and red There were some abrasion on the leg Dopplers were negative for DVT Her toes have been cyanotic for the past 2 wks And chronic left drop Pulses present and no change She is having some sensory issues feeling like there is chord laying on the bed under her leg No fever.   12/22 A/O x4, patient states has had multiple surgeries on her back secondary to scoliosis.  Her LEFT foot drop and left leg swelling is chronic.  States her left leg swelling is worse post surgery than usual. 12/23 A/O x4, swelling in patient's left lower leg has decreased with placement of TED hose.  Patient somewhat upset that will now not be picked up for transport to rehabilitation until Monday. 12/24 afebrile overnight, patient sitting comfortably in chair, edema in left lower leg has almost completely resolved  12/25 afebrile overnight  Covid vaccination; Review of Systems:  Review of Systems  Constitutional: Negative.   HENT: Negative.   Eyes: Negative.   Respiratory: Negative.   Cardiovascular: Negative.   Gastrointestinal: Negative.   Genitourinary: Negative.   Musculoskeletal: Positive for back pain, falls, joint pain and neck pain.  Skin: Negative.   Neurological: Positive for sensory change, focal weakness and  headaches.  Endo/Heme/Allergies: Negative.   Psychiatric/Behavioral: Negative.       Past Medical History: Past Medical History:  Diagnosis Date  . Anemia   . Arthritis   . Back pain   . Diabetes mellitus without complication ([place])   . Fatigue   . Fibroids   . GI bleeding 03/2020  . Hemorrhoid   . Hypertension   . Mitral valve prolapse   . MRSA (methicillin resistant Staphylococcus aureus)   . Night sweats   . Wound infection     Past Surgical History: Past Surgical History:  Procedure Laterality Date  . BACK SURGERY    . BREAST LUMPECTOMY     lft  . ESOPHAGOGASTRODUODENOSCOPY  03/12/2020   HOT HEMOSTASIS (ARGON PLASMA COAGULATION/BICAP) (N/A )  . ESOPHAGOGASTRODUODENOSCOPY (EGD) WITH PROPOFOL N/A 03/12/2020   Procedure: ESOPHAGOGASTRODUODENOSCOPY (EGD) WITH PROPOFOL;  Surgeon: Clarene Essex, MD;  Location: [facility];  Service: Endoscopy;  Laterality: N/A;  . GASTRIC BYPASS    . HALO APPLICATION N/A 13/24/4010   Procedure: HALO TRACTION APPLICATION;  Surgeon: Kary Kos, MD;  Location: [facility];  Service: Neurosurgery;  Laterality: N/A;  . HERNIA REPAIR    . HOT HEMOSTASIS N/A 03/12/2020   Procedure: HOT HEMOSTASIS (ARGON PLASMA COAGULATION/BICAP);  Surgeon: Clarene Essex, MD;  Location: [facility];  Service: Endoscopy;  Laterality: N/A;  . SPINE SURGERY     corrective spine collapsed, scoliosis and scoliosis revision  . TOTAL HIP ARTHROPLASTY  2018     Allergies:  Allergies  Allergen Reactions  . Lactose Intolerance (Gi) Nausea And Vomiting and Other (See Comments)    gassy  . Tape Rash    Reaction to most kinds of adhesive     Social History:  reports that she has never smoked. She has never used smokeless tobacco. She reports that she does not drink alcohol and does not use drugs.   Family History: Family History  Problem Relation Age of Onset  . BRCA 1/2 Mother        BRCA2+ by default, no testing  . Breast cancer Sister 79       BRCA2+  . BRCA  1/2 Sister 78       BRCA2 +  . Pancreatic cancer Maternal Uncle        Dx in his 52s  . BRCA 1/2 Maternal Uncle        BRCA2+  . Prostate cancer Maternal Grandfather   . Breast cancer Maternal Aunt        dx in 65s  . Ovarian cancer Maternal Aunt        dx in 25s  . Esophageal cancer Maternal Uncle 62  . BRCA 1/2 Maternal Uncle        BRCA2+  . Colon cancer Maternal Uncle 45  . Breast cancer Cousin        dx in her 54s  . BRCA 1/2 Cousin        dx in her 58s; BRCA2+       Procedures/Significant Events:  12/22 vascular ultrasound ABI bilateral lower extremities;  Right: Resting right ankle-brachial index is within normal range. The right toe-brachial index is abnormal.  Left: Resting left ankle-brachial index is within normal range.The left  toe-brachial index is normal.  12/21 bilateral lower extremity ultrasound negative DVT   I have personally reviewed and interpreted all radiology studies and my findings are as above.  VENTILATOR SETTINGS:   Cultures 12/6 SARS coronavirus negative 12/6 influenza A/B negative   Antimicrobials: Anti-infectives (From admission, onward)   Start     Dose/Rate Route Frequency Ordered Stop   04/22/20 0600  ceFAZolin (ANCEF) IVPB 2g/100 mL premix  Status:  Discontinued        2 g 200 mL/hr over 30 Minutes Intravenous On call to O.R. 04/21/20 2057 04/21/20 2101   04/21/20 1445  ceFAZolin (ANCEF) IVPB 2g/100 mL premix        2 g 200 mL/hr over 30 Minutes Intravenous  Once 04/21/20 1443 04/21/20 1529       Devices    LINES / TUBES:      Continuous Infusions: . lactated ringers 10 mL/hr at 04/21/20 1454     Physical Exam: Vitals:   05/02/20 2007 05/02/20 2330 05/03/20 0347 05/03/20 0754  BP: 94/63 104/80 101/80 111/71  Pulse: 100 77 81 (!) 105  Resp: 16 16 18 18   Temp: 98 F (36.7 C) 97.9 F (36.6 C) 97.6 F (36.4 C) 98.2 F (36.8 C)  TempSrc: Oral Oral    SpO2: 97% 100% 99% 96%  Weight:      Height:         General: A/O x4, No acute respiratory distress.  What appears to be a large hematoma midline just above the forehead Eyes: negative scleral hemorrhage, negative anisocoria, negative icterus ENT: Negative Runny nose, negative gingival bleeding, Neck:  Negative scars, masses, torticollis, lymphadenopathy, JVD, halo collar in place Lungs: Clear to auscultation bilaterally without wheezes or crackles Cardiovascular: Regular rate and rhythm  without murmur gallop or rub normal S1 and S2 Abdomen: negative abdominal pain, nondistended, positive soft, bowel sounds, no rebound, no ascites, no appreciable mass Extremities: bilateral TED hose to the knees on~1+ edema, significantly less than on 11/23 Skin: Negative rashes, lesions, ulcers Psychiatric:  Negative depression, negative anxiety, negative fatigue, negative mania  Central nervous system:  Cranial nerves II through XII intact, tongue/uvula midline, all extremities muscle strength 5/5, sensation intact throughout,  negative dysarthria, negative expressive aphasia, negative receptive aphasia.  Data reviewed:  I have personally reviewed following labs and imaging studies Labs:  CBC: Recent Labs  Lab 04/29/20 2142 05/01/20 0205 05/02/20 0517 05/03/20 0101  WBC 8.4 8.4 7.0 9.7  NEUTROABS 6.7 6.1 5.1 7.8*  HGB 11.6* 11.4* 11.3* 11.6*  HCT 39.0 37.6 36.2 36.9  MCV 100.3* 100.5* 98.6 98.7  PLT 559* 574* 525* 542*    Basic Metabolic Panel: Recent Labs  Lab 04/29/20 2142 04/30/20 0213 05/01/20 0205 05/02/20 0517 05/03/20 0101  NA 133* 141 140 137 136  K 4.3 4.3 4.4 3.7 4.0  CL 96* 101 99 101 98  CO2 27 28 34* 30 29  GLUCOSE 328* 81 211* 163* 159*  BUN 11 11 11 12 14   CREATININE 0.67 0.56 0.74 0.58 0.72  CALCIUM 8.3* 9.2 8.8* 8.3* 8.7*  MG  --   --  2.4 2.3 2.5*  PHOS  --   --  3.5 3.3 4.0   GFR Estimated Creatinine Clearance: 85.2 mL/min (by C-G formula based on SCr of 0.72 mg/dL). Liver Function Tests: Recent Labs  Lab  04/29/20 2142 05/01/20 0205 05/02/20 0517 05/03/20 0101  AST 17 14* 19 20  ALT 23 22 25 25   ALKPHOS 75 69 68 73  BILITOT 0.4 0.2* 0.2* 0.3  PROT 5.6* 5.7* 5.6* 5.7*  ALBUMIN 3.0* 3.0* 3.0* 3.0*   No results for input(s): LIPASE, AMYLASE in the last 168 hours. No results for input(s): AMMONIA in the last 168 hours. Coagulation profile Recent Labs  Lab 04/30/20 0213  INR 1.0    Cardiac Enzymes: No results for input(s): CKTOTAL, CKMB, CKMBINDEX, TROPONINI in the last 168 hours. BNP: Invalid input(s): POCBNP CBG: Recent Labs  Lab 05/02/20 1640 05/02/20 1659 05/02/20 1727 05/02/20 2114 05/03/20 0753  GLUCAP 58* 56* 108* 220* 100*   D-Dimer No results for input(s): DDIMER in the last 72 hours. Hgb A1c No results for input(s): HGBA1C in the last 72 hours. Lipid Profile No results for input(s): CHOL, HDL, LDLCALC, TRIG, CHOLHDL, LDLDIRECT in the last 72 hours. Thyroid function studies No results for input(s): TSH, T4TOTAL, T3FREE, THYROIDAB in the last 72 hours.  Invalid input(s): FREET3 Anemia work up No results for input(s): VITAMINB12, FOLATE, FERRITIN, TIBC, IRON, RETICCTPCT in the last 72 hours. Urinalysis    Component Value Date/Time   COLORURINE YELLOW 04/29/2020 2257   APPEARANCEUR HAZY (A) 04/29/2020 2257   LABSPEC 1.020 04/29/2020 2257   PHURINE 6.0 04/29/2020 2257   GLUCOSEU >=500 (A) 04/29/2020 2257   HGBUR NEGATIVE 04/29/2020 2257   BILIRUBINUR NEGATIVE 04/29/2020 2257   KETONESUR NEGATIVE 04/29/2020 2257   PROTEINUR NEGATIVE 04/29/2020 2257   UROBILINOGEN 1.0 05/12/2009 1406   NITRITE NEGATIVE 04/29/2020 2257   LEUKOCYTESUR LARGE (A) 04/29/2020 2257     Microbiology Recent Results (from the past 240 hour(s))  MRSA PCR Screening     Status: None   Collection Time: 04/29/20  1:45 AM   Specimen: Nasal Mucosa; Nasopharyngeal  Result Value Ref Range Status   MRSA  by PCR NEGATIVE NEGATIVE Final    Comment:        The GeneXpert MRSA Assay  (FDA approved for NASAL specimens only), is one component of a comprehensive MRSA colonization surveillance program. It is not intended to diagnose MRSA infection nor to guide or monitor treatment for MRSA infections. Performed at [facility], [place] [address] [place], [place] [address]   SARS Coronavirus 2 by RT PCR (hospital order, performed in [facility] hospital lab) Nasopharyngeal Nasopharyngeal Swab     Status: None   Collection Time: 05/01/20 10:07 AM   Specimen: Nasopharyngeal Swab  Result Value Ref Range Status   SARS Coronavirus 2 NEGATIVE NEGATIVE Final    Comment: (NOTE) SARS-CoV-2 target nucleic acids are NOT DETECTED.  The SARS-CoV-2 RNA is generally detectable in upper and lower respiratory specimens during the acute phase of infection. The lowest concentration of SARS-CoV-2 viral copies this assay can detect is 250 copies / mL. A negative result does not preclude SARS-CoV-2 infection and should not be used as the sole basis for treatment or other patient management decisions.  A negative result may occur with improper specimen collection / handling, submission of specimen other than nasopharyngeal swab, presence of viral mutation(s) within the areas targeted by this assay, and inadequate number of viral copies (<250 copies / mL). A negative result must be combined with clinical observations, patient history, and epidemiological information.  Fact Sheet for Patients:   StrictlyIdeas.no  Fact Sheet for Healthcare Providers: BankingDealers.co.za  This test is not yet approved or  cleared by the [place] FDA and has been authorized for detection and/or diagnosis of SARS-CoV-2 by FDA under an Emergency Use Authorization (EUA).  This EUA will remain in effect (meaning this test can be used) for the duration of the COVID-19 declaration under Section 564(b)(1) of the Act, 21 U.S.C. section 360bbb-3(b)(1),  unless the authorization is terminated or revoked sooner.  Performed at [facility], [place] [address], [place], [place] [address]        Inpatient Medications:   Scheduled Meds: . diphenhydrAMINE  50 mg Oral QHS   And  . acetaminophen  1,000 mg Oral QHS  . atorvastatin  10 mg Oral QPM  . bacitracin   Topical BID  . docusate sodium  100 mg Oral BID  . DULoxetine  60 mg Oral QPM  . etanercept  50 mg Subcutaneous Q Thu  . feeding supplement  237 mL Oral BID BM  . furosemide  60 mg Intravenous Daily  . gabapentin  600 mg Oral TID  . gabapentin  800 mg Oral QHS  . heparin injection (subcutaneous)  5,000 Units Subcutaneous Q8H  . insulin aspart  0-15 Units Subcutaneous TID WC  . insulin aspart  0-5 Units Subcutaneous QHS  . insulin aspart  4 Units Subcutaneous TID WC  . lisinopril  2.5 mg Oral Daily  . metoprolol tartrate  25 mg Oral BID  . multivitamin with minerals  1 tablet Oral Daily  . oxyCODONE  15 mg Oral Q4H  . pantoprazole  40 mg Oral BID  . Semaglutide(0.25 or 0.5MG/DOS)  0.5 mg Subcutaneous Q Thu  . senna  1 tablet Oral BID  . sucralfate  1 g Oral TID WC & HS  . Vitamin D (Ergocalciferol)  50,000 Units Oral Once per day on Mon Wed Fri Sat   Continuous Infusions: . lactated ringers 10 mL/hr at 04/21/20 1454     Radiological Exams on Admission: No  results found.  Impression/Recommendations Active Problems:   Diabetes mellitus (HCC)   Neuropathy   Lymphedema   Cervical spine fracture (HCC)   PAD (peripheral artery disease) (HCC)  Essential HTN -Strict in and out +4.1 L -Daily weight Filed Weights   04/14/20 1429 04/21/20 1446 05/01/20 0311  Weight: 85.7 kg 85.7 kg 85.5 kg  -Lasix IV 60 mg daily -Lisinopril 2.5 mg daily -Metoprolol 25 mg BID  LEFT leg swelling -Acute on chronic -Patient has multiple reasons for left lower leg swelling, multiple surgeries to her lower back, hypoalbuminemia, peripheral neuropathy. -ABI and lower extremity  Doppler WNL see results below -TED hose to the knee -Patient unable to elevate her legs secondary to wearing a halo and limited mobility in her back secondary to multiple surgeries.  Hx PAD diagnosed at [facility] 2018 -LEFT foot with poor capillary refill and cyanosis of the toes (chronic) -PT/DP +1 bilateral  Diabetes type 2 controlled with complication/DM neuropathy  -12/22 hemoglobin A1c= 5.8 -Taking metformin 2062m daily and Ozempic 0.535mweekly at home -.12/25 decrease NovoLog 2 units qac -12/25 Sensitive SSI  Chronic tachycardia -Resolved  Lactic acidosis -Most likely multifactorial -Trend lactic acid Results for SCAnna-Marie, CollerMY A (MRN [id number] of 04/30/2020 08:47  Ref. Range 04/29/2020 21:42 04/30/2020 02:13  Lactic Acid, Venous Latest Ref Range: 0.5 - 1.9 mmol/L 2.2 (HH) 3.8 (HH)   -multifactorial likely combination of known hepatic disease, use of Metformin, peripheral arterial disease -For right now hold Metformin -Repeat lactic acid -Would avoid fluid overload as patient likely will develop worsening edema in the given hypoalbuminemia  Hypoalbuminemia  -nutritional versus hepatic.  Obtain prealbumin nutritional consult Likely contributing to generalized edema -Prealbumin WNL  Obese -KUB showed nonobstructive gas pattern    Thank you for this consultation.  [facility] will see this patient again on Monday 12/27.   Time Spent: 50 minutes  Dj Senteno, CUGeraldo Docker.D. [facility] 05/03/2020, 8:17 AM  [phone]

## 2020-05-04 LAB — CBC WITH DIFFERENTIAL/PLATELET
Abs Immature Granulocytes: 0.03 10*3/uL (ref 0.00–0.07)
Basophils Absolute: 0 10*3/uL (ref 0.0–0.1)
Basophils Relative: 0 %
Eosinophils Absolute: 0.1 10*3/uL (ref 0.0–0.5)
Eosinophils Relative: 1 %
HCT: 37.8 % (ref 36.0–46.0)
Hemoglobin: 11.9 g/dL — ABNORMAL LOW (ref 12.0–15.0)
Immature Granulocytes: 0 %
Lymphocytes Relative: 14 %
Lymphs Abs: 0.9 10*3/uL (ref 0.7–4.0)
MCH: 31.1 pg (ref 26.0–34.0)
MCHC: 31.5 g/dL (ref 30.0–36.0)
MCV: 98.7 fL (ref 80.0–100.0)
Monocytes Absolute: 0.6 10*3/uL (ref 0.1–1.0)
Monocytes Relative: 8 %
Neutro Abs: 5.2 10*3/uL (ref 1.7–7.7)
Neutrophils Relative %: 77 %
Platelets: 546 10*3/uL — ABNORMAL HIGH (ref 150–400)
RBC: 3.83 MIL/uL — ABNORMAL LOW (ref 3.87–5.11)
RDW: 15.4 % (ref 11.5–15.5)
WBC: 6.8 10*3/uL (ref 4.0–10.5)
nRBC: 0 % (ref 0.0–0.2)

## 2020-05-04 LAB — COMPREHENSIVE METABOLIC PANEL
ALT: 32 U/L (ref 0–44)
AST: 21 U/L (ref 15–41)
Albumin: 3.1 g/dL — ABNORMAL LOW (ref 3.5–5.0)
Alkaline Phosphatase: 78 U/L (ref 38–126)
Anion gap: 8 (ref 5–15)
BUN: 15 mg/dL (ref 6–20)
CO2: 31 mmol/L (ref 22–32)
Calcium: 8.5 mg/dL — ABNORMAL LOW (ref 8.9–10.3)
Chloride: 99 mmol/L (ref 98–111)
Creatinine, Ser: 0.62 mg/dL (ref 0.44–1.00)
GFR, Estimated: >60 mL/min (ref >60)
Glucose, Bld: 159 mg/dL — ABNORMAL HIGH (ref 70–99)
Potassium: 4.1 mmol/L (ref 3.5–5.1)
Sodium: 138 mmol/L (ref 135–145)
Total Bilirubin: 0.4 mg/dL (ref 0.3–1.2)
Total Protein: 5.8 g/dL — ABNORMAL LOW (ref 6.5–8.1)

## 2020-05-04 LAB — GLUCOSE, CAPILLARY
Glucose-Capillary: 121 mg/dL — ABNORMAL HIGH (ref 70–99)
Glucose-Capillary: 120 mg/dL — ABNORMAL HIGH (ref 70–99)
Glucose-Capillary: 189 mg/dL — ABNORMAL HIGH (ref 70–99)
Glucose-Capillary: 143 mg/dL — ABNORMAL HIGH (ref 70–99)

## 2020-05-04 LAB — PHOSPHORUS: Phosphorus: 3.8 mg/dL (ref 2.5–4.6)

## 2020-05-04 LAB — MAGNESIUM: Magnesium: 2.4 mg/dL (ref 1.7–2.4)

## 2020-05-04 MED ORDER — INSULIN ASPART 100 UNIT/ML ~~LOC~~ SOLN
0.0000 [IU] | SUBCUTANEOUS | Status: DC
  Administered 2020-05-04: 18:00:00 2 [IU] via SUBCUTANEOUS
  Administered 2020-05-04 (×2): 1 [IU] via SUBCUTANEOUS
  Administered 2020-05-05: 05:00:00 2 [IU] via SUBCUTANEOUS
  Administered 2020-05-05: 5 [IU] via SUBCUTANEOUS

## 2020-05-04 NOTE — Progress Notes (Signed)
Subjective: NAEs o/n  Objective: Vital signs in last 24 hours: Temp:  [98.1 F (36.7 C)-98.9 F (37.2 C)] 98.9 F (37.2 C) (12/26 1044) Pulse Rate:  [66-99] 90 (12/26 1044) Resp:  [16-20] 18 (12/26 1044) BP: (108-135)/(60-98) 120/89 (12/26 1044) SpO2:  [93 %-98 %] 95 % (12/26 1044) Weight:  [85 kg] 85 kg (12/26 0350)  Intake/Output from previous day: 12/25 0701 - 12/26 0700 In: 240 [P.O.:240] Out: -  Intake/Output this shift: Total I/O In: 240 [P.O.:240] Out: -   NAD Halo in place Full strength, ambulating to bathroom at time of exam  Lab Results: Recent Labs    05/03/20 0101 05/04/20 0325  WBC 9.7 6.8  HGB 11.6* 11.9*  HCT 36.9 37.8  PLT 542* 546*   BMET Recent Labs    05/03/20 0101 05/04/20 0325  NA 136 138  K 4.0 4.1  CL 98 99  CO2 29 31  GLUCOSE 159* 159*  BUN 14 15  CREATININE 0.72 0.62  CALCIUM 8.7* 8.5*    Studies/Results: No results found.  Assessment/Plan: C2 frx s/p halo - likely discharge to SNF, potentially Monday   LOS: 19 days     Vallarie Mare 05/04/2020, 11:45 AM

## 2020-05-05 DIAGNOSIS — W010XXA Fall on same level from slipping, tripping and stumbling without subsequent striking against object, initial encounter: Secondary | ICD-10-CM | POA: Diagnosis not present

## 2020-05-05 DIAGNOSIS — S12400A Unspecified displaced fracture of fifth cervical vertebra, initial encounter for closed fracture: Secondary | ICD-10-CM | POA: Diagnosis not present

## 2020-05-05 DIAGNOSIS — S12100A Unspecified displaced fracture of second cervical vertebra, initial encounter for closed fracture: Secondary | ICD-10-CM | POA: Diagnosis not present

## 2020-05-05 DIAGNOSIS — T148XXA Other injury of unspecified body region, initial encounter: Secondary | ICD-10-CM | POA: Diagnosis not present

## 2020-05-05 DIAGNOSIS — E119 Type 2 diabetes mellitus without complications: Secondary | ICD-10-CM

## 2020-05-05 LAB — COMPREHENSIVE METABOLIC PANEL
ALT: 31 U/L (ref 0–44)
AST: 16 U/L (ref 15–41)
Albumin: 3.1 g/dL — ABNORMAL LOW (ref 3.5–5.0)
Alkaline Phosphatase: 79 U/L (ref 38–126)
Anion gap: 11 (ref 5–15)
BUN: 16 mg/dL (ref 6–20)
CO2: 31 mmol/L (ref 22–32)
Calcium: 9 mg/dL (ref 8.9–10.3)
Chloride: 97 mmol/L — ABNORMAL LOW (ref 98–111)
Creatinine, Ser: 0.7 mg/dL (ref 0.44–1.00)
GFR, Estimated: >60 mL/min (ref >60)
Glucose, Bld: 191 mg/dL — ABNORMAL HIGH (ref 70–99)
Potassium: 4.3 mmol/L (ref 3.5–5.1)
Sodium: 139 mmol/L (ref 135–145)
Total Bilirubin: 0.4 mg/dL (ref 0.3–1.2)
Total Protein: 6 g/dL — ABNORMAL LOW (ref 6.5–8.1)

## 2020-05-05 LAB — CBC WITH DIFFERENTIAL/PLATELET
Abs Immature Granulocytes: 0.03 10*3/uL (ref 0.00–0.07)
Basophils Absolute: 0 10*3/uL (ref 0.0–0.1)
Basophils Relative: 0 %
Eosinophils Absolute: 0.1 10*3/uL (ref 0.0–0.5)
Eosinophils Relative: 1 %
HCT: 38.5 % (ref 36.0–46.0)
Hemoglobin: 11.4 g/dL — ABNORMAL LOW (ref 12.0–15.0)
Immature Granulocytes: 0 %
Lymphocytes Relative: 14 %
Lymphs Abs: 1.1 10*3/uL (ref 0.7–4.0)
MCH: 29.9 pg (ref 26.0–34.0)
MCHC: 29.6 g/dL — ABNORMAL LOW (ref 30.0–36.0)
MCV: 101 fL — ABNORMAL HIGH (ref 80.0–100.0)
Monocytes Absolute: 0.7 10*3/uL (ref 0.1–1.0)
Monocytes Relative: 9 %
Neutro Abs: 5.9 10*3/uL (ref 1.7–7.7)
Neutrophils Relative %: 76 %
Platelets: 518 10*3/uL — ABNORMAL HIGH (ref 150–400)
RBC: 3.81 MIL/uL — ABNORMAL LOW (ref 3.87–5.11)
RDW: 15.3 % (ref 11.5–15.5)
WBC: 7.9 10*3/uL (ref 4.0–10.5)
nRBC: 0 % (ref 0.0–0.2)

## 2020-05-05 LAB — GLUCOSE, CAPILLARY
Glucose-Capillary: 100 mg/dL — ABNORMAL HIGH (ref 70–99)
Glucose-Capillary: 166 mg/dL — ABNORMAL HIGH (ref 70–99)
Glucose-Capillary: 282 mg/dL — ABNORMAL HIGH (ref 70–99)

## 2020-05-05 LAB — PHOSPHORUS: Phosphorus: 3.7 mg/dL (ref 2.5–4.6)

## 2020-05-05 LAB — MAGNESIUM: Magnesium: 2.4 mg/dL (ref 1.7–2.4)

## 2020-05-05 MED ORDER — METHOCARBAMOL 500 MG PO TABS: 500.0000 mg | ORAL_TABLET | Freq: Four times a day (QID) | ORAL | 0 refills | Status: DC

## 2020-05-05 MED ORDER — OXYCODONE-ACETAMINOPHEN 10-325 MG PO TABS
1.0000 | ORAL_TABLET | ORAL | 0 refills | Status: DC | PRN
Start: 2020-05-05 — End: 2020-05-20

## 2020-05-05 NOTE — TOC Transition Note (Signed)
Transition of Care (TOC) - CM/SW Discharge Note Marvetta Gibbons RN,BSN Transitions of Care Unit 4NP (non trauma) - RN Case Manager See Treatment Team for direct Phone #   Patient Details  Name: Leah Franco MRN: [id number] Date of Birth: [birth year]  Transition of Care [facility]) CM/SW Contact:  Dawayne Patricia, RN Phone Number: 05/05/2020, 11:12 AM   Clinical Narrative:    Have spoken with Pam at [facility] and confirmed bed available today and they are prepared to admit pt today. Confirmed with Pam pt does not need another COVID test, will need updated d/c summary. Request sent to attending. D/C summary faxed to [facility] via epic.  PTAR call for transport at 11am- paperwork placed on shadow chart. Patient updated at the bedside.   Number for RN to call report to [facility] INPT rehab- [phone]  Admitting MD - Dr. Schuyler Amor Room- C402- [facility] INPT rehab- [facility] at [place] 4th floor  Final next level of care: IP Rehab Facility Barriers to Discharge: Continued Medical Work up   Patient Goals and CMS Choice Patient states their goals for this hospitalization and ongoing recovery are:: rehab CMS Medicare.gov Compare Post Acute Care list provided to:: Patient Choice offered to / list presented to : Patient  Discharge Placement               [facility]        Discharge Plan and Services In-house Referral: Clinical Social Work Discharge Planning Services: CM Consult Post Acute Care Choice: IP Rehab                               Social Determinants of Health (SDOH) Interventions     Readmission Risk Interventions Readmission Risk Prevention Plan 05/05/2020  Transportation Screening Complete  PCP or Specialist Appt within 3-5 Days Complete  HRI or [place] Complete  Social Work Consult for [place] Planning/Counseling Complete  Palliative Care Screening Not Applicable  Medication Review Press photographer) Complete  Some recent data might  be hidden

## 2020-05-05 NOTE — Care Management Important Message (Signed)
Important Message  Patient Details  Name: Leah Franco MRN: [id number] Date of Birth: 05-03-1966   Medicare Important Message Given:  Yes  Patient left prior to IM delivery IM mailed to Patient home address.    [name] 05/05/2020, 2:34 PM

## 2020-05-05 NOTE — Progress Notes (Signed)
Physical Therapy Treatment Patient Details Name: Leah Franco MRN: [id number] DOB: 18-Jan-1966 Today's Date: 05/05/2020    History of Present Illness 54 yo female admitted to ED on 12/6 with fall. CT and MRI cervical spine reveal acute mildly displaced type III odontoid fracture, C1 bilateral posterior arch fracture, L L3-4 widening at facet joint, mild edema ALL at dens reveals strain vs disruption. Worsening displacement, s/p Halo collar application on 35/36. + redness, swelling, TTP L calf, LE [place] negative for DVT, workup with vascular for venous and/or arterial insufficiency. 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    PT Comments    The pt was eager and willing to participate in PT session this morning despite anticipated d/c to rehab today in order to prepare for d/c. The pt was able to complete multiple short bouts of ambulation in the room with minG and use of RW. She was able to complete good navigation in tight spaces without assist, and complete self-care tasks standing at the sink without UE support. The pt will continue to benefit from skilled therapy to progress functional independence and dynamic stability to reduce risk of falls and fall-related injury following d/c.     Follow Up Recommendations  CIR     Equipment Recommendations  Rolling walker with 5" wheels;3in1 (PT);Hospital bed    Recommendations for Other Services       Precautions / Restrictions Precautions Precautions: Fall;Cervical Precaution Booklet Issued: Yes (comment) Precaution Comments: spinal/cervical precautions; pt with halo Required Braces or Orthoses: Other Brace Cervical Brace: At all times Other Brace: Halo vest Restrictions Weight Bearing Restrictions: No    Mobility  Bed Mobility               General bed mobility comments: pt in recliner at start and end of session  Transfers Overall transfer level: Needs  assistance Equipment used: Rolling walker (2 wheeled) Transfers: Sit to/from Stand Sit to Stand: Min guard         General transfer comment: minG with RW, increased time to rise and steady. Pt reaching for UE support with static standing after rise  Ambulation/Gait Ambulation/Gait assistance: Min guard Gait Distance (Feet): 15 Feet (x4) Assistive device: Rolling walker (2 wheeled) Gait Pattern/deviations: Step-through pattern;Decreased stride length;Decreased dorsiflexion - left;Shuffle   Gait velocity interpretation: <1.31 ft/sec, indicative of household ambulator General Gait Details: min guard for safety, verbal cuing for upright posture, slowing gait speed for safety and as needed       Balance Overall balance assessment: Needs assistance Sitting-balance support: No upper extremity supported Sitting balance-[name] Scale: Good Sitting balance - Comments: able to sit EOB without PT support   Standing balance support: Single extremity supported;Bilateral upper extremity supported;During functional activity Standing balance-[name] Scale: Fair Standing balance comment: fair static standing, benefits from UE support with dynamic tasks                            Cognition Arousal/Alertness: Awake/alert Behavior During Therapy: WFL for tasks assessed/performed Overall Cognitive Status: Within Functional Limits for tasks assessed                                 General Comments: very pleasant, motivated to progress      Exercises      General Comments General comments (skin integrity,  edema, etc.): VSS on RA, transport arrived to take pt to rehab      Pertinent Vitals/Pain Pain Assessment: Faces Faces Pain Scale: Hurts little more Pain Location: posterior head Pain Descriptors / Indicators: Aching;Headache;Sore Pain Intervention(s): Limited activity within patient's tolerance;Monitored during session;Repositioned;Patient requesting pain meds-RN  notified           PT Goals (current goals can now be found in the care plan section) Acute Rehab PT Goals Patient Stated Goal: get rehab to get stronger and independent PT Goal Formulation: With patient Time For Goal Achievement: 05/06/20 Potential to Achieve Goals: Good Progress towards PT goals: Progressing toward goals    Frequency    Min 4X/week      PT Plan Current plan remains appropriate       AM-PAC PT "6 Clicks" Mobility   Outcome Measure  Help needed turning from your back to your side while in a flat bed without using bedrails?: A Little Help needed moving from lying on your back to sitting on the side of a flat bed without using bedrails?: A Little Help needed moving to and from a bed to a chair (including a wheelchair)?: A Little Help needed standing up from a chair using your arms (e.g., wheelchair or bedside chair)?: A Little Help needed to walk in hospital room?: A Little Help needed climbing 3-5 steps with a railing? : A Lot 6 Click Score: 17    End of Session Equipment Utilized During Treatment: Gait belt;Back brace Activity Tolerance: Patient tolerated treatment well;Patient limited by fatigue Patient left: with call bell/phone within reach;Other (comment);in chair Nurse Communication: Mobility status PT Visit Diagnosis: Other abnormalities of gait and mobility (R26.89);Difficulty in walking, not elsewhere classified (R26.2);Pain Pain - part of body:  (neck)     Time: 5883-2549 PT Time Calculation (min) (ACUTE ONLY): 34 min  Charges:  $Gait Training: 8-22 mins $Therapeutic Activity: 8-22 mins                     Karma Ganja, PT, DPT   Acute Rehabilitation Department Pager #: [phone]   Otho Bellows 05/05/2020, 12:06 PM

## 2020-05-05 NOTE — Consult Note (Signed)
Medical Consultation   Leah Franco  XTK:[id number]  DOB: February 28, 1966  DOA: 04/14/2020  PCP: Leah Shan, MD    Requesting physician:   Reason for consultation: Multiple medical problems   History of Present Illness: 54 yo WF PMHx DM2, neuropathy, HLD, obesity, Gastric bypass status for obesity, long standing leg edema due to venous insufficiency,iron def anemia and thrombocytosis, chronic tachycardia, GI bleed  Patient came in the recurrent falls while walking on stairs.  First fall was in November 2 fall was on 6 December.  Resulting in odontoid fracture admitted by neurosurgery in c-collar he will   Last night 3 Am her LEft leg started to get warm and red There were some abrasion on the leg Dopplers were negative for DVT Her toes have been cyanotic for the past 2 wks And chronic left drop Pulses present and no change She is having some sensory issues feeling like there is chord laying on the bed under her leg No fever.   12/22 A/O x4, patient states has had multiple surgeries on her back secondary to scoliosis.  Her LEFT foot drop and left leg swelling is chronic.  States her left leg swelling is worse post surgery than usual. 12/23 A/O x4, swelling in patient's left lower leg has decreased with placement of TED hose.  Patient somewhat upset that will now not be picked up for transport to rehabilitation until Monday. 12/24 afebrile overnight, patient sitting comfortably in chair, edema in left lower leg has almost completely resolved  12/25 afebrile overnight 12/27 afebrile overnight A/O x4 medically stable  Covid vaccination; Review of Systems:  Review of Systems  Constitutional: Negative.   HENT: Negative.   Eyes: Negative.   Respiratory: Negative.   Cardiovascular: Negative.   Gastrointestinal: Negative.   Genitourinary: Negative.   Musculoskeletal: Positive for back pain, falls, joint pain and neck pain.  Skin: Negative.   Neurological:  Positive for sensory change, focal weakness and headaches.  Endo/Heme/Allergies: Negative.   Psychiatric/Behavioral: Negative.       Past Medical History: Past Medical History:  Diagnosis Date  . Anemia   . Arthritis   . Back pain   . Diabetes mellitus without complication ([place])   . Fatigue   . Fibroids   . GI bleeding 03/2020  . Hemorrhoid   . Hypertension   . Mitral valve prolapse   . MRSA (methicillin resistant Staphylococcus aureus)   . Night sweats   . Wound infection     Past Surgical History: Past Surgical History:  Procedure Laterality Date  . BACK SURGERY    . BREAST LUMPECTOMY     lft  . ESOPHAGOGASTRODUODENOSCOPY  03/12/2020   HOT HEMOSTASIS (ARGON PLASMA COAGULATION/BICAP) (N/A )  . ESOPHAGOGASTRODUODENOSCOPY (EGD) WITH PROPOFOL N/A 03/12/2020   Procedure: ESOPHAGOGASTRODUODENOSCOPY (EGD) WITH PROPOFOL;  Surgeon: Leah Essex, MD;  Location: [place];  Service: Endoscopy;  Laterality: N/A;  . GASTRIC BYPASS    . HALO APPLICATION N/A 99/24/2683   Procedure: HALO TRACTION APPLICATION;  Surgeon: Leah Kos, MD;  Location: [place];  Service: Neurosurgery;  Laterality: N/A;  . HERNIA REPAIR    . HOT HEMOSTASIS N/A 03/12/2020   Procedure: HOT HEMOSTASIS (ARGON PLASMA COAGULATION/BICAP);  Surgeon: Leah Essex, MD;  Location: [place];  Service: Endoscopy;  Laterality: N/A;  . SPINE SURGERY     corrective spine collapsed, scoliosis and scoliosis revision  . TOTAL HIP ARTHROPLASTY  2018  Allergies:   Allergies  Allergen Reactions  . Lactose Intolerance (Gi) Nausea And Vomiting and Other (See Comments)    gassy  . Tape Rash    Reaction to most kinds of adhesive     Social History:  reports that she has never smoked. She has never used smokeless tobacco. She reports that she does not drink alcohol and does not use drugs.   Family History: Family History  Problem Relation Age of Onset  . BRCA 1/2 Mother        BRCA2+ by default, no testing  .  Breast cancer Sister 45       BRCA2+  . BRCA 1/2 Sister 68       BRCA2 +  . Pancreatic cancer Maternal Uncle        Dx in his 36s  . BRCA 1/2 Maternal Uncle        BRCA2+  . Prostate cancer Maternal Grandfather   . Breast cancer Maternal Aunt        dx in 56s  . Ovarian cancer Maternal Aunt        dx in 62s  . Esophageal cancer Maternal Uncle 62  . BRCA 1/2 Maternal Uncle        BRCA2+  . Colon cancer Maternal Uncle 87  . Breast cancer Cousin        dx in her 63s  . BRCA 1/2 Cousin        dx in her 22s; BRCA2+       Procedures/Significant Events:  12/22 vascular ultrasound ABI bilateral lower extremities;  Right: Resting right ankle-brachial index is within normal range. The right toe-brachial index is abnormal.  Left: Resting left ankle-brachial index is within normal range.The left  toe-brachial index is normal.  12/21 bilateral lower extremity ultrasound negative DVT   I have personally reviewed and interpreted all radiology studies and my findings are as above.  VENTILATOR SETTINGS:   Cultures 12/6 SARS coronavirus negative 12/6 influenza A/B negative   Antimicrobials: Anti-infectives (From admission, onward)   Start     Dose/Rate Route Frequency Ordered Stop   04/22/20 0600  ceFAZolin (ANCEF) IVPB 2g/100 mL premix  Status:  Discontinued        2 g 200 mL/hr over 30 Minutes Intravenous On call to O.R. 04/21/20 2057 04/21/20 2101   04/21/20 1445  ceFAZolin (ANCEF) IVPB 2g/100 mL premix        2 g 200 mL/hr over 30 Minutes Intravenous  Once 04/21/20 1443 04/21/20 1529       Devices    LINES / TUBES:      Continuous Infusions: . lactated ringers 10 mL/hr at 04/21/20 1454     Physical Exam: Vitals:   05/04/20 2019 05/05/20 0001 05/05/20 0354 05/05/20 0805  BP: 97/72 115/86 (!) 121/91 (!) 135/95  Pulse: 91 91 84 93  Resp: 18 18 18 20   Temp: 98.4 F (36.9 C) 97.6 F (36.4 C) 97.6 F (36.4 C) (!) 97.5 F (36.4 C)  TempSrc: Oral Oral Oral  Oral  SpO2: 100% 100% 99% 100%  Weight:      Height:        General: A/O x4, No acute respiratory distress.  What appears to be a large hematoma midline just above the forehead Eyes: negative scleral hemorrhage, negative anisocoria, negative icterus ENT: Negative Runny nose, negative gingival bleeding, Neck:  Negative scars, masses, torticollis, lymphadenopathy, JVD, halo collar in place Lungs: Clear to auscultation bilaterally without wheezes or crackles  Cardiovascular: Regular rate and rhythm without murmur gallop or rub normal S1 and S2 Abdomen: negative abdominal pain, nondistended, positive soft, bowel sounds, no rebound, no ascites, no appreciable mass Extremities: bilateral TED hose to the knees on, 0 edema,  Skin: Negative rashes, lesions, ulcers Psychiatric:  Negative depression, negative anxiety, negative fatigue, negative mania  Central nervous system:  Cranial nerves II through XII intact, tongue/uvula midline, all extremities muscle strength 5/5, sensation intact throughout,  negative dysarthria, negative expressive aphasia, negative receptive aphasia.  Data reviewed:  I have personally reviewed following labs and imaging studies Labs:  CBC: Recent Labs  Lab 05/01/20 0205 05/02/20 0517 05/03/20 0101 05/04/20 0325 05/05/20 0346  WBC 8.4 7.0 9.7 6.8 7.9  NEUTROABS 6.1 5.1 7.8* 5.2 5.9  HGB 11.4* 11.3* 11.6* 11.9* 11.4*  HCT 37.6 36.2 36.9 37.8 38.5  MCV 100.5* 98.6 98.7 98.7 101.0*  PLT 574* 525* 542* 546* 518*    Basic Metabolic Panel: Recent Labs  Lab 05/01/20 0205 05/02/20 0517 05/03/20 0101 05/04/20 0325 05/05/20 0346  NA 140 137 136 138 139  K 4.4 3.7 4.0 4.1 4.3  CL 99 101 98 99 97*  CO2 34* 30 29 31 31   GLUCOSE 211* 163* 159* 159* 191*  BUN 11 12 14 15 16   CREATININE 0.74 0.58 0.72 0.62 0.70  CALCIUM 8.8* 8.3* 8.7* 8.5* 9.0  MG 2.4 2.3 2.5* 2.4 2.4  PHOS 3.5 3.3 4.0 3.8 3.7   GFR Estimated Creatinine Clearance: 84.9 mL/min (by C-G formula  based on SCr of 0.7 mg/dL). Liver Function Tests: Recent Labs  Lab 05/01/20 0205 05/02/20 0517 05/03/20 0101 05/04/20 0325 05/05/20 0346  AST 14* 19 20 21 16   ALT 22 25 25  32 31  ALKPHOS 69 68 73 78 79  BILITOT 0.2* 0.2* 0.3 0.4 0.4  PROT 5.7* 5.6* 5.7* 5.8* 6.0*  ALBUMIN 3.0* 3.0* 3.0* 3.1* 3.1*   No results for input(s): LIPASE, AMYLASE in the last 168 hours. No results for input(s): AMMONIA in the last 168 hours. Coagulation profile Recent Labs  Lab 04/30/20 0213  INR 1.0    Cardiac Enzymes: No results for input(s): CKTOTAL, CKMB, CKMBINDEX, TROPONINI in the last 168 hours. BNP: Invalid input(s): POCBNP CBG: Recent Labs  Lab 05/04/20 1744 05/04/20 2008 05/05/20 0010 05/05/20 0438 05/05/20 0758  GLUCAP 189* 143* 282* 166* 100*   D-Dimer No results for input(s): DDIMER in the last 72 hours. Hgb A1c No results for input(s): HGBA1C in the last 72 hours. Lipid Profile No results for input(s): CHOL, HDL, LDLCALC, TRIG, CHOLHDL, LDLDIRECT in the last 72 hours. Thyroid function studies No results for input(s): TSH, T4TOTAL, T3FREE, THYROIDAB in the last 72 hours.  Invalid input(s): FREET3 Anemia work up No results for input(s): VITAMINB12, FOLATE, FERRITIN, TIBC, IRON, RETICCTPCT in the last 72 hours. Urinalysis    Component Value Date/Time   COLORURINE YELLOW 04/29/2020 2257   APPEARANCEUR HAZY (A) 04/29/2020 2257   LABSPEC 1.020 04/29/2020 2257   PHURINE 6.0 04/29/2020 2257   GLUCOSEU >=500 (A) 04/29/2020 2257   HGBUR NEGATIVE 04/29/2020 2257   BILIRUBINUR NEGATIVE 04/29/2020 2257   KETONESUR NEGATIVE 04/29/2020 2257   PROTEINUR NEGATIVE 04/29/2020 2257   UROBILINOGEN 1.0 05/12/2009 1406   NITRITE NEGATIVE 04/29/2020 2257   LEUKOCYTESUR LARGE (A) 04/29/2020 2257     Microbiology Recent Results (from the past 240 hour(s))  MRSA PCR Screening     Status: None   Collection Time: 04/29/20  1:45 AM   Specimen: Nasal Mucosa;  Nasopharyngeal  Result  Value Ref Range Status   MRSA by PCR NEGATIVE NEGATIVE Final    Comment:        The GeneXpert MRSA Assay (FDA approved for NASAL specimens only), is one component of a comprehensive MRSA colonization surveillance program. It is not intended to diagnose MRSA infection nor to guide or monitor treatment for MRSA infections. Performed at [facility], [place] [address] [place], [place] [address]   SARS Coronavirus 2 by RT PCR (hospital order, performed in [facility] hospital lab) Nasopharyngeal Nasopharyngeal Swab     Status: None   Collection Time: 05/01/20 10:07 AM   Specimen: Nasopharyngeal Swab  Result Value Ref Range Status   SARS Coronavirus 2 NEGATIVE NEGATIVE Final    Comment: (NOTE) SARS-CoV-2 target nucleic acids are NOT DETECTED.  The SARS-CoV-2 RNA is generally detectable in upper and lower respiratory specimens during the acute phase of infection. The lowest concentration of SARS-CoV-2 viral copies this assay can detect is 250 copies / mL. A negative result does not preclude SARS-CoV-2 infection and should not be used as the sole basis for treatment or other patient management decisions.  A negative result may occur with improper specimen collection / handling, submission of specimen other than nasopharyngeal swab, presence of viral mutation(s) within the areas targeted by this assay, and inadequate number of viral copies (<250 copies / mL). A negative result must be combined with clinical observations, patient history, and epidemiological information.  Fact Sheet for Patients:   StrictlyIdeas.no  Fact Sheet for Healthcare Providers: BankingDealers.co.za  This test is not yet approved or  cleared by the [place] FDA and has been authorized for detection and/or diagnosis of SARS-CoV-2 by FDA under an Emergency Use Authorization (EUA).  This EUA will remain in effect (meaning this test can be used) for the  duration of the COVID-19 declaration under Section 564(b)(1) of the Act, 21 U.S.C. section 360bbb-3(b)(1), unless the authorization is terminated or revoked sooner.  Performed at [facility], [place] [address] [place], [place] [address]        Inpatient Medications:   Scheduled Meds: . diphenhydrAMINE  50 mg Oral QHS   And  . acetaminophen  1,000 mg Oral QHS  . atorvastatin  10 mg Oral QPM  . bacitracin   Topical BID  . docusate sodium  100 mg Oral BID  . DULoxetine  60 mg Oral QPM  . etanercept  50 mg Subcutaneous Q Thu  . feeding supplement  237 mL Oral BID BM  . furosemide  60 mg Intravenous Daily  . gabapentin  600 mg Oral TID  . gabapentin  800 mg Oral QHS  . heparin injection (subcutaneous)  5,000 Units Subcutaneous Q8H  . insulin aspart  0-9 Units Subcutaneous Q4H  . insulin aspart  2 Units Subcutaneous TID WC  . lisinopril  2.5 mg Oral Daily  . metoprolol tartrate  25 mg Oral BID  . multivitamin with minerals  1 tablet Oral Daily  . oxyCODONE  15 mg Oral Q4H  . pantoprazole  40 mg Oral BID  . Semaglutide(0.25 or 0.5MG/DOS)  0.5 mg Subcutaneous Q Thu  . senna  1 tablet Oral BID  . sucralfate  1 g Oral TID WC & HS  . Vitamin D (Ergocalciferol)  50,000 Units Oral Once per day on Mon Wed Fri Sat   Continuous Infusions: . lactated ringers 10 mL/hr at 04/21/20 1454     Radiological Exams on Admission: No  results found.  Impression/Recommendations Active Problems:   Diabetes mellitus (HCC)   Neuropathy   Lymphedema   Cervical spine fracture (HCC)   PAD (peripheral artery disease) (HCC)  Essential HTN -Strict in and out +7.2 L (do not believe accurate) -Daily weight Filed Weights   04/21/20 1446 05/01/20 0311 05/04/20 0350  Weight: 85.7 kg 85.5 kg 85 kg  -Would not send patient home on scheduled diuretic.  Would send patient home on PRN diuretic. -Lisinopril 2.5 mg daily -Metoprolol 25 mg BID  LEFT leg swelling -Acute on chronic -Patient has  multiple reasons for left lower leg swelling, multiple surgeries to her lower back, hypoalbuminemia, peripheral neuropathy. -ABI and lower extremity Doppler WNL see results below -TED hose to the knee -Patient unable to elevate her legs secondary to wearing a halo and limited mobility in her back secondary to multiple surgeries.  Hx PAD diagnosed at [facility] 2018 -LEFT foot with poor capillary refill and cyanosis of the toes (chronic) -PT/DP +1 bilateral  Diabetes type 2 controlled with complication/DM neuropathy  -12/22 hemoglobin A1c= 5.8 -Taking metformin 2013m daily and Ozempic 0.580mweekly at home -. recommend sending patient home on her home medication for diabetes.  Allow PCP to make adjustments as required   Chronic tachycardia -Resolved  Lactic acidosis -Most likely multifactorial -Trend lactic acid Results for SCJOSELYNNE, Franco (MRN [id number] of 05/05/2020 10:06  Ref. Range 04/29/2020 21:42 04/30/2020 02:13 05/02/2020 05:17  Lactic Acid, Venous Latest Ref Range: 0.5 - 1.9 mmol/L 2.2 (HH) 3.8 (HH) 0.8  -multifactorial likely combination of known hepatic disease, use of Metformin, peripheral arterial disease  Hypoalbuminemia  -nutritional versus hepatic.  Obtain prealbumin nutritional consult Likely contributing to generalized edema -Prealbumin WNL  Obese -KUB showed nonobstructive gas pattern    Thank you for this consultation.  [facility] will sign off patient medically stable for discharge  Time Spent: 50 minutes  Charleigh Correnti, CUGeraldo Docker.D. [facility] 05/05/2020, 9:58 AM  [phone]

## 2020-05-05 NOTE — Progress Notes (Signed)
Discharge instruction given. Report given to [facility], all questions were answered.

## 2020-05-05 NOTE — Progress Notes (Signed)
Subjective: Patient reports Doing well condition of headache and neck pain but otherwise feels the halo was holding her in stable position she has been cleared for transfer to rehab today  Objective: Vital signs in last 24 hours: Temp:  [97.5 F (36.4 C)-98.4 F (36.9 C)] 97.5 F (36.4 C) (12/27 0805) Pulse Rate:  [84-97] 93 (12/27 0805) Resp:  [18-20] 20 (12/27 0805) BP: (97-135)/(72-95) 135/95 (12/27 0805) SpO2:  [99 %-100 %] 100 % (12/27 0805)  Intake/Output from previous day: 12/26 0701 - 12/27 0700 In: 240 [P.O.:240] Out: -  Intake/Output this shift: Total I/O In: 240 [P.O.:240] Out: -   Strength 5 of 511 good position  Lab Results: Recent Labs    05/04/20 0325 05/05/20 0346  WBC 6.8 7.9  HGB 11.9* 11.4*  HCT 37.8 38.5  PLT 546* 518*   BMET Recent Labs    05/04/20 0325 05/05/20 0346  NA 138 139  K 4.1 4.3  CL 99 97*  CO2 31 31  GLUCOSE 159* 191*  BUN 15 16  CREATININE 0.62 0.70  CALCIUM 8.5* 9.0    Studies/Results: No results found.  Assessment/Plan: Status post C2 fracture halo placement awaiting transfer to rehab today.  LOS: 20 days     Leah Franco 05/05/2020, 11:43 AM

## 2020-05-20 ENCOUNTER — Other Ambulatory Visit: Payer: Self-pay

## 2020-05-20 ENCOUNTER — Encounter (HOSPITAL_COMMUNITY): Payer: Self-pay | Admitting: Neurosurgery

## 2020-05-21 ENCOUNTER — Encounter (HOSPITAL_COMMUNITY): Payer: Self-pay | Admitting: Neurosurgery

## 2020-05-21 ENCOUNTER — Ambulatory Visit (HOSPITAL_COMMUNITY)
Admission: RE | Admit: 2020-05-21 | Discharge: 2020-05-21 | Disposition: A | Discharge status: Home / Self Care | Payer: PRIVATE HEALTH INSURANCE | Attending: Neurosurgery | Admitting: Neurosurgery

## 2020-05-21 ENCOUNTER — Ambulatory Visit (HOSPITAL_COMMUNITY): Payer: PRIVATE HEALTH INSURANCE | Admitting: Certified Registered Nurse Anesthetist

## 2020-05-21 ENCOUNTER — Ambulatory Visit (HOSPITAL_COMMUNITY): Payer: PRIVATE HEALTH INSURANCE

## 2020-05-21 ENCOUNTER — Encounter (HOSPITAL_COMMUNITY)
Admission: RE | Disposition: A | Discharge status: Home / Self Care | Payer: Self-pay | Source: Home / Self Care | Attending: Neurosurgery

## 2020-05-21 ENCOUNTER — Other Ambulatory Visit: Payer: Self-pay

## 2020-05-21 ENCOUNTER — Other Ambulatory Visit: Payer: Self-pay | Admitting: Neurosurgery

## 2020-05-21 DIAGNOSIS — Z7984 Long term (current) use of oral hypoglycemic drugs: Secondary | ICD-10-CM | POA: Insufficient documentation

## 2020-05-21 DIAGNOSIS — M79672 Pain in left foot: Secondary | ICD-10-CM

## 2020-05-21 DIAGNOSIS — S12111G Posterior displaced Type II dens fracture, subsequent encounter for fracture with delayed healing: Secondary | ICD-10-CM | POA: Diagnosis not present

## 2020-05-21 DIAGNOSIS — Z79899 Other long term (current) drug therapy: Secondary | ICD-10-CM | POA: Diagnosis not present

## 2020-05-21 DIAGNOSIS — Z888 Allergy status to other drugs, medicaments and biological substances status: Secondary | ICD-10-CM | POA: Diagnosis not present

## 2020-05-21 DIAGNOSIS — Z20822 Contact with and (suspected) exposure to covid-19: Secondary | ICD-10-CM | POA: Insufficient documentation

## 2020-05-21 DIAGNOSIS — Z448 Encounter for fitting and adjustment of other external prosthetic devices: Secondary | ICD-10-CM | POA: Diagnosis present

## 2020-05-21 DIAGNOSIS — X58XXXD Exposure to other specified factors, subsequent encounter: Secondary | ICD-10-CM | POA: Diagnosis not present

## 2020-05-21 DIAGNOSIS — Z419 Encounter for procedure for purposes other than remedying health state, unspecified: Secondary | ICD-10-CM

## 2020-05-21 HISTORY — DX: Scoliosis, unspecified: M41.9

## 2020-05-21 HISTORY — DX: Type 2 diabetes mellitus without complications: E11.9

## 2020-05-21 HISTORY — PX: ODONTOID SCREW INSERTION: SHX5704

## 2020-05-21 LAB — SARS CORONAVIRUS 2 BY RT PCR (HOSPITAL ORDER, PERFORMED IN ~~LOC~~ HOSPITAL LAB): SARS Coronavirus 2: NEGATIVE

## 2020-05-21 LAB — CBC
HCT: 37.8 % (ref 36.0–46.0)
Hemoglobin: 12 g/dL (ref 12.0–15.0)
MCH: 31.2 pg (ref 26.0–34.0)
MCHC: 31.7 g/dL (ref 30.0–36.0)
MCV: 98.2 fL (ref 80.0–100.0)
Platelets: 354 10*3/uL (ref 150–400)
RBC: 3.85 MIL/uL — ABNORMAL LOW (ref 3.87–5.11)
RDW: 15.9 % — ABNORMAL HIGH (ref 11.5–15.5)
WBC: 5.6 10*3/uL (ref 4.0–10.5)
nRBC: 0 % (ref 0.0–0.2)

## 2020-05-21 LAB — BASIC METABOLIC PANEL
Anion gap: 10 (ref 5–15)
BUN: 11 mg/dL (ref 6–20)
CO2: 24 mmol/L (ref 22–32)
Calcium: 8.3 mg/dL — ABNORMAL LOW (ref 8.9–10.3)
Chloride: 105 mmol/L (ref 98–111)
Creatinine, Ser: 0.53 mg/dL (ref 0.44–1.00)
GFR, Estimated: >60 mL/min (ref >60)
Glucose, Bld: 106 mg/dL — ABNORMAL HIGH (ref 70–99)
Potassium: 3.4 mmol/L — ABNORMAL LOW (ref 3.5–5.1)
Sodium: 139 mmol/L (ref 135–145)

## 2020-05-21 LAB — GLUCOSE, CAPILLARY: Glucose-Capillary: 114 mg/dL — ABNORMAL HIGH (ref 70–99)

## 2020-05-21 SURGERY — INSERTION, SCREW, ODONTOID
Anesthesia: Monitor Anesthesia Care | Site: Neck

## 2020-05-21 MED ORDER — OXYCODONE HCL 5 MG/5ML PO SOLN
5.0000 mg | Freq: Once | ORAL | Status: DC | PRN
Start: 2020-05-21 — End: 2020-05-21

## 2020-05-21 MED ORDER — OXYCODONE HCL 5 MG PO TABS: 5.0000 mg | ORAL_TABLET | Freq: Once | ORAL | Status: DC | PRN

## 2020-05-21 MED ORDER — MEPERIDINE HCL 25 MG/ML IJ SOLN: 6.2500 mg | INTRAMUSCULAR | Status: DC | PRN

## 2020-05-21 MED ORDER — PROPOFOL 10 MG/ML IV BOLUS
INTRAVENOUS | Status: AC
  Filled 2020-05-21: qty 20

## 2020-05-21 MED ORDER — HYDROMORPHONE HCL 1 MG/ML IJ SOLN
INTRAMUSCULAR | Status: AC
  Filled 2020-05-21: qty 1

## 2020-05-21 MED ORDER — ONDANSETRON HCL 4 MG/2ML IJ SOLN
INTRAMUSCULAR | Status: AC
  Filled 2020-05-21: qty 6

## 2020-05-21 MED ORDER — CHLORHEXIDINE GLUCONATE CLOTH 2 % EX PADS: 6.0000 | MEDICATED_PAD | Freq: Once | CUTANEOUS | Status: DC

## 2020-05-21 MED ORDER — METHOCARBAMOL 500 MG PO TABS: 500.0000 mg | ORAL_TABLET | Freq: Four times a day (QID) | ORAL | 0 refills | Status: AC

## 2020-05-21 MED ORDER — HYDROMORPHONE HCL 1 MG/ML IJ SOLN
0.2500 mg | INTRAMUSCULAR | Status: DC | PRN
  Administered 2020-05-21: 0.5 mg via INTRAVENOUS

## 2020-05-21 MED ORDER — FENTANYL CITRATE (PF) 250 MCG/5ML IJ SOLN
INTRAMUSCULAR | Status: DC | PRN
  Administered 2020-05-21 (×3): 50 ug via INTRAVENOUS

## 2020-05-21 MED ORDER — LIDOCAINE 2% (20 MG/ML) 5 ML SYRINGE
INTRAMUSCULAR | Status: DC | PRN
  Administered 2020-05-21: 40 mg via INTRAVENOUS

## 2020-05-21 MED ORDER — OXYCODONE HCL 10 MG PO TABS: 10.0000 mg | ORAL_TABLET | Freq: Three times a day (TID) | ORAL | 0 refills | Status: AC

## 2020-05-21 MED ORDER — MIDAZOLAM HCL 2 MG/2ML IJ SOLN
INTRAMUSCULAR | Status: AC
  Filled 2020-05-21: qty 2

## 2020-05-21 MED ORDER — PROPOFOL 500 MG/50ML IV EMUL
INTRAVENOUS | Status: DC | PRN
  Administered 2020-05-21: 50 ug/kg/min via INTRAVENOUS

## 2020-05-21 MED ORDER — PROMETHAZINE HCL 25 MG/ML IJ SOLN
6.2500 mg | INTRAMUSCULAR | Status: DC | PRN
Start: 2020-05-21 — End: 2020-05-21

## 2020-05-21 MED ORDER — DEXAMETHASONE SODIUM PHOSPHATE 10 MG/ML IJ SOLN
INTRAMUSCULAR | Status: AC
  Filled 2020-05-21: qty 1

## 2020-05-21 MED ORDER — ACETAMINOPHEN 500 MG PO TABS: 1000.0000 mg | ORAL_TABLET | Freq: Once | ORAL | Status: DC

## 2020-05-21 MED ORDER — MIDAZOLAM HCL 2 MG/2ML IJ SOLN
INTRAMUSCULAR | Status: DC | PRN
  Administered 2020-05-21: 2 mg via INTRAVENOUS

## 2020-05-21 MED ORDER — MIDAZOLAM HCL 2 MG/2ML IJ SOLN: 0.5000 mg | Freq: Once | INTRAMUSCULAR | Status: DC | PRN

## 2020-05-21 MED ORDER — ROCURONIUM BROMIDE 10 MG/ML (PF) SYRINGE
PREFILLED_SYRINGE | INTRAVENOUS | Status: AC
  Filled 2020-05-21: qty 20

## 2020-05-21 MED ORDER — FENTANYL CITRATE (PF) 250 MCG/5ML IJ SOLN
INTRAMUSCULAR | Status: AC
  Filled 2020-05-21: qty 5

## 2020-05-21 MED ORDER — BUTALBITAL-APAP-CAFFEINE 50-325-40 MG PO TABS: 1.0000 | ORAL_TABLET | ORAL | 0 refills | Status: AC | PRN

## 2020-05-21 MED ORDER — CHLORHEXIDINE GLUCONATE 0.12 % MT SOLN
15.0000 mL | Freq: Once | OROMUCOSAL | Status: AC
  Administered 2020-05-21: 15 mL via OROMUCOSAL
  Filled 2020-05-21: qty 15

## 2020-05-21 MED ORDER — ORAL CARE MOUTH RINSE: 15.0000 mL | Freq: Once | OROMUCOSAL | Status: AC

## 2020-05-21 MED ORDER — LIDOCAINE 2% (20 MG/ML) 5 ML SYRINGE
INTRAMUSCULAR | Status: AC
  Filled 2020-05-21: qty 15

## 2020-05-21 MED ORDER — ONDANSETRON HCL 4 MG/2ML IJ SOLN
INTRAMUSCULAR | Status: DC | PRN
  Administered 2020-05-21: 4 mg via INTRAVENOUS

## 2020-05-21 MED ORDER — SODIUM CHLORIDE 0.9 % IV SOLN: INTRAVENOUS | Status: DC

## 2020-05-21 SURGICAL SUPPLY — 63 items
BAND RUBBER #18 3X1/16 STRL (MISCELLANEOUS) IMPLANT
BENZOIN TINCTURE PRP APPL 2/3 (GAUZE/BANDAGES/DRESSINGS) IMPLANT
BLADE CLIPPER SURG (BLADE) IMPLANT
BUR MATCHSTICK NEURO 3.0 LAGG (BURR) IMPLANT
CARTRIDGE OIL MAESTRO DRILL (MISCELLANEOUS) ×1 IMPLANT
CLEANER TIP ELECTROSURG 2X2 (MISCELLANEOUS) IMPLANT
COVER WAND RF STERILE (DRAPES) IMPLANT
DECANTER SPIKE VIAL GLASS SM (MISCELLANEOUS) IMPLANT
DERMABOND ADVANCED (GAUZE/BANDAGES/DRESSINGS)
DERMABOND ADVANCED .7 DNX12 (GAUZE/BANDAGES/DRESSINGS) IMPLANT
DIFFUSER DRILL AIR PNEUMATIC (MISCELLANEOUS) IMPLANT
DRAPE C-ARM 42X72 X-RAY (DRAPES) IMPLANT
DRAPE HALF SHEET 40X57 (DRAPES) IMPLANT
DRAPE LAPAROTOMY 100X72 PEDS (DRAPES) IMPLANT
DRAPE MICROSCOPE LEICA (MISCELLANEOUS) IMPLANT
DRAPE POUCH INSTRU U-SHP 10X18 (DRAPES) ×2 IMPLANT
ELECT COATED BLADE 2.86 ST (ELECTRODE) IMPLANT
GAUZE 4X4 16PLY RFD (DISPOSABLE) IMPLANT
GAUZE SPONGE 4X4 12PLY STRL (GAUZE/BANDAGES/DRESSINGS) IMPLANT
GLOVE BIO SURGEON STRL SZ 6.5 (GLOVE) IMPLANT
GLOVE BIO SURGEON STRL SZ7 (GLOVE) IMPLANT
GLOVE BIO SURGEON STRL SZ7.5 (GLOVE) IMPLANT
GLOVE BIO SURGEON STRL SZ8 (GLOVE) IMPLANT
GLOVE BIO SURGEON STRL SZ8.5 (GLOVE) IMPLANT
GLOVE BIOGEL M 8.0 STRL (GLOVE) IMPLANT
GLOVE BIOGEL PI IND STRL 7.0 (GLOVE) IMPLANT
GLOVE BIOGEL PI INDICATOR 7.0 (GLOVE)
GLOVE ECLIPSE 6.5 STRL STRAW (GLOVE) IMPLANT
GLOVE ECLIPSE 7.0 STRL STRAW (GLOVE) IMPLANT
GLOVE ECLIPSE 7.5 STRL STRAW (GLOVE) IMPLANT
GLOVE ECLIPSE 8.0 STRL XLNG CF (GLOVE) IMPLANT
GLOVE ECLIPSE 8.5 STRL (GLOVE) IMPLANT
GLOVE EXAM NITRILE XL STR (GLOVE) IMPLANT
GLOVE INDICATOR 6.5 STRL GRN (GLOVE) IMPLANT
GLOVE INDICATOR 7.0 STRL GRN (GLOVE) IMPLANT
GLOVE INDICATOR 7.5 STRL GRN (GLOVE) IMPLANT
GLOVE INDICATOR 8.0 STRL GRN (GLOVE) IMPLANT
GLOVE INDICATOR 8.5 STRL (GLOVE) IMPLANT
GLOVE OPTIFIT SS 8.0 STRL (GLOVE) IMPLANT
GLOVE SURG SS PI 6.5 STRL IVOR (GLOVE) IMPLANT
GOWN STRL REUS W/ TWL LRG LVL3 (GOWN DISPOSABLE) IMPLANT
GOWN STRL REUS W/ TWL XL LVL3 (GOWN DISPOSABLE) IMPLANT
GOWN STRL REUS W/TWL 2XL LVL3 (GOWN DISPOSABLE) IMPLANT
GOWN STRL REUS W/TWL LRG LVL3 (GOWN DISPOSABLE)
GOWN STRL REUS W/TWL XL LVL3 (GOWN DISPOSABLE)
HALTER HD/CHIN CERV TRACTION D (MISCELLANEOUS) IMPLANT
HEMOSTAT SURGICEL 2X14 (HEMOSTASIS) IMPLANT
KIT BASIN OR (CUSTOM PROCEDURE TRAY) IMPLANT
KIT TURNOVER KIT B (KITS) ×2 IMPLANT
NS IRRIG 1000ML POUR BTL (IV SOLUTION) ×2 IMPLANT
OIL CARTRIDGE MAESTRO DRILL (MISCELLANEOUS) ×2
PACK LAMINECTOMY NEURO (CUSTOM PROCEDURE TRAY) IMPLANT
SPONGE INTESTINAL PEANUT (DISPOSABLE) IMPLANT
SPONGE SURGIFOAM ABS GEL SZ50 (HEMOSTASIS) IMPLANT
STRIP CLOSURE SKIN 1/2X4 (GAUZE/BANDAGES/DRESSINGS) IMPLANT
SUT SILK 0 TIES 10X30 (SUTURE) IMPLANT
SUT SILK 2 0 SH (SUTURE) IMPLANT
SUT SILK 3 0 SH 30 (SUTURE) IMPLANT
SUT VIC AB 3-0 SH 8-18 (SUTURE) IMPLANT
SUT VICRYL 4-0 PS2 18IN ABS (SUTURE) IMPLANT
TOWEL GREEN STERILE (TOWEL DISPOSABLE) IMPLANT
TOWEL GREEN STERILE FF (TOWEL DISPOSABLE) IMPLANT
WATER STERILE IRR 1000ML POUR (IV SOLUTION) IMPLANT

## 2020-05-21 NOTE — Transfer of Care (Signed)
Immediate Anesthesia Transfer of Care Note  Patient: Leah Franco  Procedure(s) Performed: Intraoperative fluoroscopic reduction of odontoid fracture with light sedation - Cervical one-Cervical two (N/A Neck)  Patient Location: PACU  Anesthesia Type:MAC  Level of Consciousness: awake, alert  and patient cooperative  Airway & Oxygen Therapy: Patient Spontanous Breathing  Post-op Assessment: Report given to RN and Post -op Vital signs reviewed and stable  Post vital signs: Reviewed and stable   Last Vitals:  Vitals Value Taken Time  BP 87/57 05/21/20 1351  Temp    Pulse 54 05/21/20 1351  Resp 19 05/21/20 1351  SpO2 98 % 05/21/20 1351  Vitals shown include unvalidated device data.  Last Pain:  Vitals:   05/21/20 1044  TempSrc:   PainSc: 0-No pain         Complications: No complications documented.

## 2020-05-21 NOTE — OR Nursing (Signed)
Pt is awake,alert and oriented halo in place.  Pt is in NAD at this time. Pt and family verbalized understanding of poc and discharge instructions. instructions given to family and reviewed prior to discharge.Pt is ambulatory to wheelchair with stand by assist but not needed with steady gait.

## 2020-05-21 NOTE — Anesthesia Preprocedure Evaluation (Addendum)
Anesthesia Evaluation  Patient identified by MRN, date of birth, ID band Patient awake    Reviewed: Allergy & Precautions, NPO status , Patient's Chart, lab work & pertinent test results  History of Anesthesia Complications Negative for: history of anesthetic complications  Airway Mallampati: II  TM Distance: >3 FB Neck ROM: Full    Dental  (+) Dental Advisory Given   Pulmonary COPD,  05/21/2020 SARS coronavirus NEG   breath sounds clear to auscultation       Cardiovascular hypertension, Pt. on medications and Pt. on home beta blockers (-) angina Rhythm:Regular Rate:Normal     Neuro/Psych Depression  C-spine not cleared    GI/Hepatic Neg liver ROS, GERD  Medicated and Controlled,S/p gastric bypass   Endo/Other  diabetes (glu 114), Oral Hypoglycemic Agents, Insulin Dependentlupus  Renal/GU negative Renal ROS     Musculoskeletal  (+) Arthritis ,   Abdominal   Peds  Hematology negative hematology ROS (+)   Anesthesia Other Findings   Reproductive/Obstetrics                            Anesthesia Physical Anesthesia Plan  ASA: III  Anesthesia Plan: MAC   Post-op Pain Management:    Induction:   PONV Risk Score and Plan: 2 and Ondansetron and Treatment may vary due to age or medical condition  Airway Management Planned: Natural Airway and Simple Face Mask  Additional Equipment: None  Intra-op Plan:   Post-operative Plan:   Informed Consent: I have reviewed the patients History and Physical, chart, labs and discussed the procedure including the risks, benefits and alternatives for the proposed anesthesia with the patient or authorized representative who has indicated his/her understanding and acceptance.     Dental advisory given  Plan Discussed with: CRNA and Surgeon  Anesthesia Plan Comments:         Anesthesia Quick Evaluation

## 2020-05-21 NOTE — Op Note (Addendum)
Preoperative diagnosis: Type II odontoid fracture status post halo placement with posterior displacement of the odontoid in the halo.  Postoperative diagnosis: Same  Procedure: Closed reduction of type II odontoid fracture under fluoroscopic guidance with retightening of halo vest  Anesthesia:  light sedation  Surgeon Dominica Severin Laird Runnion  Assistant Nash Shearer  HPI 55 year old female with type II odontoid fracture status post halo placement patient was discharged to hospital went to rehab came back in routine follow-up and x-ray showed posterior displacement of the odontoid fracture so patient presents for intraoperative reconfiguring of halo vest and closed reduction of odontoid fracture.  Operative procedure: Patient brought into the OR was positioned on the OR table initially I I tightened all 4 pins down to a torque of 10 then under fluoroscopic guidance we loosened various nuts around the halo and translated the patient's neck anteriorly and with some additional flexion got the odontoid back in alignment and anchored everything in place.  Patient tolerated the procedure very well was remitted to covering will be discharged from recovery room.

## 2020-05-21 NOTE — Anesthesia Postprocedure Evaluation (Signed)
Anesthesia Post Note  Patient: Terry A Seaberg  Procedure(s) Performed: Intraoperative fluoroscopic reduction of odontoid fracture with light sedation - Cervical one-Cervical two (N/A Neck)     Patient location during evaluation: PACU Anesthesia Type: MAC Level of consciousness: awake and alert, oriented and patient cooperative Pain management: pain level controlled Vital Signs Assessment: post-procedure vital signs reviewed and stable Respiratory status: spontaneous breathing, nonlabored ventilation and respiratory function stable Cardiovascular status: blood pressure returned to baseline and stable Postop Assessment: no apparent nausea or vomiting, able to ambulate and adequate PO intake Anesthetic complications: no   No complications documented.  Last Vitals:  Vitals:   05/21/20 1400 05/21/20 1415  BP: (!) 122/93 (!) 118/99  Pulse: 82 72  Resp: 16 18  Temp:  (!) 36.4 C  SpO2: 98% 98%    Last Pain:  Vitals:   05/21/20 1415  TempSrc:   PainSc: 4                  Jla Reynolds,E. Aarib Pulido

## 2020-05-21 NOTE — Consult Note (Signed)
Vascular and Vein Specialist   Patient name: Leah Franco MRN: [id number] DOB: 1965-07-27 Sex: female    HPI: Leah Franco is a 55 y.o. female seen today for evaluation of cyanotic changes left foot.  She has a very complex past history.  She currently is scheduled for cervical surgery today.  She is in a halo.  Had cervical fracture after multiple falls.  She was admitted with a fall in December and at that time had some discoloration in her left leg.  She underwent venous studies which were negative for DVT and on 1222 underwent arterial studies which revealed normal ankle arm index and toe pressures bilaterally.  She had triphasic waveforms bilaterally.  She reports that in the past she was evaluated for venous reflux and apparently was seen at an outlying vein center which suggested ablation of her left great saphenous vein.  This was not done due to multiple other medical issues at the time.  She reports that she had a repeat study 1 year later and this did not show significant reflux.  She reports that she does have cyanotic changes in the toes most prickly in her left foot but this can occasionally happen in the right as well.  She had noticed over the last several days to have a fixed discoloration in her second toe on the left.  She has severe neuropathy and does not feel her feet very well.  She also has foot drop and has a new left foot drop  brace.  Past Medical History:  Diagnosis Date  . Anemia   . Arthritis   . Back pain   . Diabetes mellitus without complication ([place])   . Fatigue   . Fibroids   . GI bleeding 03/2020  . Hemorrhoid   . Hypertension   . MRSA (methicillin resistant Staphylococcus aureus)   . Night sweats   . Scoliosis   . Type 2 diabetes mellitus ([place])   . Wound infection     Family History  Problem Relation Age of Onset  . BRCA 1/2 Mother        BRCA2+ by default, no testing  . Breast cancer Sister 60       BRCA2+   . BRCA 1/2 Sister 67       BRCA2 +  . Pancreatic cancer Maternal Uncle        Dx in his 56s  . BRCA 1/2 Maternal Uncle        BRCA2+  . Prostate cancer Maternal Grandfather   . Breast cancer Maternal Aunt        dx in 73s  . Ovarian cancer Maternal Aunt        dx in 44s  . Esophageal cancer Maternal Uncle 62  . BRCA 1/2 Maternal Uncle        BRCA2+  . Colon cancer Maternal Uncle 75  . Breast cancer Cousin        dx in her 22s  . BRCA 1/2 Cousin        dx in her 34s; BRCA2+    SOCIAL HISTORY: Social History   Tobacco Use  . Smoking status: Never Smoker  . Smokeless tobacco: Never Used  Substance Use Topics  . Alcohol use: Yes  Comment: very rare    Allergies  Allergen Reactions  . Lactose Intolerance (Gi) Nausea And Vomiting and Other (See Comments)    gassy  . Tape Rash    Reaction to most kinds of adhesive    Current Facility-Administered Medications  Medication Dose Route Frequency Provider Last Rate Last Admin  . 0.9 %  sodium chloride infusion   Intravenous Continuous Annye Asa, MD      . chlorhexidine (PERIDEX) 0.12 % solution 15 mL  15 mL Mouth/Throat Once Annye Asa, MD       Or  . MEDLINE mouth rinse  15 mL Mouth Rinse Once Annye Asa, MD      . Chlorhexidine Gluconate Cloth 2 % PADS 6 each  6 each Topical Once Kary Kos, MD       And  . Chlorhexidine Gluconate Cloth 2 % PADS 6 each  6 each Topical Once Kary Kos, MD        REVIEW OF SYSTEMS:  [X]  denotes positive finding, [ ]  denotes negative finding Cardiac  Comments:  Chest pain or chest pressure:    Shortness of breath upon exertion:    Short of breath when lying flat:    Irregular heart rhythm:        Vascular    Pain in calf, thigh, or hip brought on by ambulation:    Pain in feet at night that wakes you up from your sleep:     Blood clot in your veins:    Leg swelling:  x         PHYSICAL EXAM: Vitals:   05/20/20 1539 05/21/20 1026  BP:  131/87  Pulse:   85  Resp:  18  Temp:  97.8 F (36.6 C)  TempSrc:  Temporal  SpO2:  96%  Weight: 76.7 kg 76.7 kg  Height: 5' 3.25" (1.607 m) 5' 3.25" (1.607 m)    GENERAL: The patient is a well-nourished female, in no acute distress. The vital signs are documented above. CARDIOVASCULAR: 2+ radial pulses bilaterally.  2+ dorsalis pedis pulses bilaterally. PULMONARY: There is good air exchange  MUSCULOSKELETAL: There are no major deformities or cyanosis. NEUROLOGIC: No focal weakness or paresthesias are detected. SKIN: There are no ulcers or rashes noted.  She does have some ruborous changes in her left leg from her knee distally into her foot.  She does have what appears to be bruising on her left second toe on the dorsum. PSYCHIATRIC: The patient has a normal affect.  DATA:  Noninvasive studies from 1221 revealed no evidence of DVT Noninvasive studies from 04/30/2020 from arterial standpoint revealed normal ankle arm index and normal triphasic waveforms bilaterally  MEDICAL ISSUES: Unclear as to the etiology of her color changes.  I suspect that this is in all likelihood related to bruising from her initial fall.  She is now 3 weeks out from this event.  There is no evidence of threatened tissue loss.  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  I would not recommend any further work-up.  I would not recommend any follow-up unless she develops tissue loss.  She was reassured with this discussion will see [place] again as needed    Rosetta Posner, MD [facility] Tel [phone]

## 2020-05-21 NOTE — H&P (Signed)
Leah Franco is an 55 y.o. female.   Chief Complaint: Neck pain HPI: 55 year old female with a history of type II odontoid fracture was placed in a halo and on routine follow-up lateral C-spine showed posterior displacement of the odontoid fracture so she has been recommended fluoroscopic closed reduction of deformity.  I extensively over the risks and benefits of this procedure with her as well as perioperative course expectations of outcome and alternatives to surgery and she understands and agrees to proceed forward.  Past Medical History:  Diagnosis Date  . Anemia   . Arthritis   . Back pain   . Diabetes mellitus without complication ([place])   . Fatigue   . Fibroids   . GI bleeding 03/2020  . Hemorrhoid   . Hypertension   . MRSA (methicillin resistant Staphylococcus aureus)   . Night sweats   . Scoliosis   . Type 2 diabetes mellitus ([place])   . Wound infection     Past Surgical History:  Procedure Laterality Date  . BACK SURGERY     x 9  . BREAST LUMPECTOMY     lft  . ESOPHAGOGASTRODUODENOSCOPY  03/12/2020   HOT HEMOSTASIS (ARGON PLASMA COAGULATION/BICAP) (N/A )  . ESOPHAGOGASTRODUODENOSCOPY (EGD) WITH PROPOFOL N/A 03/12/2020   Procedure: ESOPHAGOGASTRODUODENOSCOPY (EGD) WITH PROPOFOL;  Surgeon: Clarene Essex, MD;  Location: [facility];  Service: Endoscopy;  Laterality: N/A;  . GASTRIC BYPASS    . HALO APPLICATION N/A 31/51/7616   Procedure: HALO TRACTION APPLICATION;  Surgeon: Kary Kos, MD;  Location: [facility];  Service: Neurosurgery;  Laterality: N/A;  . HERNIA REPAIR    . HOT HEMOSTASIS N/A 03/12/2020   Procedure: HOT HEMOSTASIS (ARGON PLASMA COAGULATION/BICAP);  Surgeon: Clarene Essex, MD;  Location: [facility];  Service: Endoscopy;  Laterality: N/A;  . SPINE SURGERY     corrective spine collapsed, scoliosis and scoliosis revision  . TOTAL HIP ARTHROPLASTY  2018    Family History  Problem Relation Age of Onset  . BRCA 1/2 Mother        BRCA2+ by default, no testing  .  Breast cancer Sister 74       BRCA2+  . BRCA 1/2 Sister 44       BRCA2 +  . Pancreatic cancer Maternal Uncle        Dx in his 52s  . BRCA 1/2 Maternal Uncle        BRCA2+  . Prostate cancer Maternal Grandfather   . Breast cancer Maternal Aunt        dx in 19s  . Ovarian cancer Maternal Aunt        dx in 79s  . Esophageal cancer Maternal Uncle 62  . BRCA 1/2 Maternal Uncle        BRCA2+  . Colon cancer Maternal Uncle 60  . Breast cancer Cousin        dx in her 70s  . BRCA 1/2 Cousin        dx in her 88s; BRCA2+   Social History:  reports that she has never smoked. She has never used smokeless tobacco. She reports current alcohol use. She reports that she does not use drugs.  Allergies:  Allergies  Allergen Reactions  . Lactose Intolerance (Gi) Nausea And Vomiting and Other (See Comments)    gassy  . Tape Rash    Reaction to most kinds of adhesive    Medications Prior to Admission  Medication Sig Dispense Refill  . atorvastatin (LIPITOR) 10 MG tablet  Take 10 mg by mouth every evening.     . baclofen (LIORESAL) 10 MG tablet Take 10 mg by mouth 3 (three) times daily.    . butalbital-acetaminophen-caffeine (FIORICET) 50-325-40 MG tablet Take 1-2 tablets by mouth every 4 (four) hours as needed (Pain).    . cyclobenzaprine (FLEXERIL) 5 MG tablet Take 5 mg by mouth 2 (two) times daily as needed for muscle spasms.    . diphenhydramine-acetaminophen (TYLENOL PM) 25-500 MG TABS tablet Take 2 tablets by mouth at bedtime as needed (Pain).    Marland Kitchen docusate sodium (COLACE) 100 MG capsule Take 100 mg by mouth daily.    . DULoxetine (CYMBALTA) 60 MG capsule Take 60 mg by mouth every evening.    . ergocalciferol (VITAMIN D2) 50000 UNITS capsule Take 50,000 Units by mouth See admin instructions. Take one capsule (50000 units) by mouth four times weekly in the evening - Monday, Wednesday, Friday, Saturday    . etanercept (ENBREL) 50 MG/ML injection Inject 50 mg into the skin every Tuesday.    .  gabapentin (NEURONTIN) 300 MG capsule Take 600 mg by mouth 3 (three) times daily. At bedtime take with an 800 mg tablet for a total dose of 1400 mg at night    . gabapentin (NEURONTIN) 800 MG tablet Take 800 mg by mouth at bedtime. Take with 600 mg for a total of 1400 mg at bedtime    . lisinopril (ZESTRIL) 2.5 MG tablet Take 2.5 mg by mouth daily.    . metFORMIN (GLUCOPHAGE) 500 MG tablet Take 1,000 mg by mouth 2 (two) times daily with a meal.    . metoprolol tartrate (LOPRESSOR) 25 MG tablet Take 25 mg by mouth 2 (two) times daily.     . Oxycodone HCl 10 MG TABS Take 10 mg by mouth every 8 (eight) hours.    . pantoprazole (PROTONIX) 40 MG tablet Take 1 tablet (40 mg total) by mouth 2 (two) times daily. 60 tablet 0  . Semaglutide,0.25 or 0.5MG/DOS, (OZEMPIC, 0.25 OR 0.5 MG/DOSE,) 2 MG/1.5ML SOPN Inject 0.5 mg into the skin every Tuesday.     . denosumab (PROLIA) 60 MG/ML SOSY injection Inject 60 mg into the skin every 6 (six) months.    . methocarbamol (ROBAXIN) 500 MG tablet Take 1 tablet (500 mg total) by mouth 4 (four) times daily. (Patient not taking: Reported on 05/20/2020) 45 tablet 0  . sucralfate (CARAFATE) 1 g tablet Take 1 tablet (1 g total) by mouth 4 (four) times daily. 120 tablet 0    Results for orders placed or performed during the hospital encounter of 05/21/20 (from the past 48 hour(s))  SARS Coronavirus 2 by RT PCR (hospital order, performed in [facility] hospital lab) Nasopharyngeal Nasopharyngeal Swab     Status: None   Collection Time: 05/21/20 10:06 AM   Specimen: Nasopharyngeal Swab  Result Value Ref Range   SARS Coronavirus 2 NEGATIVE NEGATIVE    Comment: (NOTE) SARS-CoV-2 target nucleic acids are NOT DETECTED.  The SARS-CoV-2 RNA is generally detectable in upper and lower respiratory specimens during the acute phase of infection. The lowest concentration of SARS-CoV-2 viral copies this assay can detect is 250 copies / mL. A negative result does not preclude  SARS-CoV-2 infection and should not be used as the sole basis for treatment or other patient management decisions.  A negative result may occur with improper specimen collection / handling, submission of specimen other than nasopharyngeal swab, presence of viral mutation(s) within the areas targeted by  this assay, and inadequate number of viral copies (<250 copies / mL). A negative result must be combined with clinical observations, patient history, and epidemiological information.  Fact Sheet for Patients:   StrictlyIdeas.no  Fact Sheet for Healthcare Providers: BankingDealers.co.za  This test is not yet approved or  cleared by the [place] FDA and has been authorized for detection and/or diagnosis of SARS-CoV-2 by FDA under an Emergency Use Authorization (EUA).  This EUA will remain in effect (meaning this test can be used) for the duration of the COVID-19 declaration under Section 564(b)(1) of the Act, 21 U.S.C. section 360bbb-3(b)(1), unless the authorization is terminated or revoked sooner.  Performed at [facility], [place] [address] [place], [place] [address]   Glucose, capillary     Status: Abnormal   Collection Time: 05/21/20 10:30 AM  Result Value Ref Range   Glucose-Capillary 114 (H) 70 - 99 mg/dL    Comment: Glucose reference range applies only to samples taken after fasting for at least 8 hours.  CBC per protocol     Status: Abnormal   Collection Time: 05/21/20 11:26 AM  Result Value Ref Range   WBC 5.6 4.0 - 10.5 K/uL   RBC 3.85 (L) 3.87 - 5.11 MIL/uL   Hemoglobin 12.0 12.0 - 15.0 g/dL   HCT 37.8 36.0 - 46.0 %   MCV 98.2 80.0 - 100.0 fL   MCH 31.2 26.0 - 34.0 pg   MCHC 31.7 30.0 - 36.0 g/dL   RDW 15.9 (H) 11.5 - 15.5 %   Platelets 354 150 - 400 K/uL   nRBC 0.0 0.0 - 0.2 %    Comment: Performed at [facility], [place] [address], [place], [place] [address]   No results found.  Review of  Systems  Blood pressure 131/87, pulse 85, temperature 97.8 F (36.6 C), temperature source Temporal, resp. rate 18, height 5' 3.25" (1.607 m), weight 76.7 kg, last menstrual period 11/04/2010, SpO2 96 %. Physical Exam   Assessment/Plan 55 year old presents for closed reduction of odontoid in a halo vest intraoperatively  Elaina Hoops, MD 05/21/2020, 12:17 PM

## 2020-05-22 ENCOUNTER — Encounter (HOSPITAL_COMMUNITY): Payer: Self-pay | Admitting: Neurosurgery

## 2020-06-16 ENCOUNTER — Encounter (HOSPITAL_BASED_OUTPATIENT_CLINIC_OR_DEPARTMENT_OTHER): Payer: Self-pay | Admitting: *Deleted

## 2020-06-16 ENCOUNTER — Other Ambulatory Visit: Payer: Self-pay

## 2020-06-16 ENCOUNTER — Emergency Department (HOSPITAL_BASED_OUTPATIENT_CLINIC_OR_DEPARTMENT_OTHER)
Admission: EM | Admit: 2020-06-16 | Discharge: 2020-06-16 | Disposition: A | Discharge status: Home / Self Care | Payer: PRIVATE HEALTH INSURANCE | Attending: Emergency Medicine | Admitting: Emergency Medicine

## 2020-06-16 DIAGNOSIS — I509 Heart failure, unspecified: Secondary | ICD-10-CM | POA: Diagnosis not present

## 2020-06-16 DIAGNOSIS — Z7984 Long term (current) use of oral hypoglycemic drugs: Secondary | ICD-10-CM | POA: Insufficient documentation

## 2020-06-16 DIAGNOSIS — Z79899 Other long term (current) drug therapy: Secondary | ICD-10-CM | POA: Diagnosis not present

## 2020-06-16 DIAGNOSIS — I2510 Atherosclerotic heart disease of native coronary artery without angina pectoris: Secondary | ICD-10-CM | POA: Insufficient documentation

## 2020-06-16 DIAGNOSIS — R21 Rash and other nonspecific skin eruption: Secondary | ICD-10-CM | POA: Diagnosis present

## 2020-06-16 DIAGNOSIS — N186 End stage renal disease: Secondary | ICD-10-CM | POA: Diagnosis not present

## 2020-06-16 DIAGNOSIS — I132 Hypertensive heart and chronic kidney disease with heart failure and with stage 5 chronic kidney disease, or end stage renal disease: Secondary | ICD-10-CM | POA: Insufficient documentation

## 2020-06-16 DIAGNOSIS — Z992 Dependence on renal dialysis: Secondary | ICD-10-CM | POA: Diagnosis not present

## 2020-06-16 DIAGNOSIS — Z96649 Presence of unspecified artificial hip joint: Secondary | ICD-10-CM | POA: Diagnosis not present

## 2020-06-16 DIAGNOSIS — J449 Chronic obstructive pulmonary disease, unspecified: Secondary | ICD-10-CM | POA: Diagnosis not present

## 2020-06-16 DIAGNOSIS — Z794 Long term (current) use of insulin: Secondary | ICD-10-CM | POA: Insufficient documentation

## 2020-06-16 DIAGNOSIS — Z8673 Personal history of transient ischemic attack (TIA), and cerebral infarction without residual deficits: Secondary | ICD-10-CM | POA: Insufficient documentation

## 2020-06-16 LAB — COMPREHENSIVE METABOLIC PANEL
ALT: 24 U/L (ref 0–44)
AST: 23 U/L (ref 15–41)
Albumin: 3 g/dL — ABNORMAL LOW (ref 3.5–5.0)
Alkaline Phosphatase: 70 U/L (ref 38–126)
Anion gap: 15 (ref 5–15)
BUN: 21 mg/dL — ABNORMAL HIGH (ref 6–20)
CO2: 25 mmol/L (ref 22–32)
Calcium: 8.5 mg/dL — ABNORMAL LOW (ref 8.9–10.3)
Chloride: 99 mmol/L (ref 98–111)
Creatinine, Ser: 0.75 mg/dL (ref 0.44–1.00)
GFR, Estimated: >60 mL/min (ref >60)
Glucose, Bld: 102 mg/dL — ABNORMAL HIGH (ref 70–99)
Potassium: 3.7 mmol/L (ref 3.5–5.1)
Sodium: 139 mmol/L (ref 135–145)
Total Bilirubin: 0.1 mg/dL — ABNORMAL LOW (ref 0.3–1.2)
Total Protein: 6.3 g/dL — ABNORMAL LOW (ref 6.5–8.1)

## 2020-06-16 LAB — CBC WITH DIFFERENTIAL/PLATELET
Abs Immature Granulocytes: 0.04 10*3/uL (ref 0.00–0.07)
Basophils Absolute: 0 10*3/uL (ref 0.0–0.1)
Basophils Relative: 1 %
Eosinophils Absolute: 0 10*3/uL (ref 0.0–0.5)
Eosinophils Relative: 1 %
HCT: 38.8 % (ref 36.0–46.0)
Hemoglobin: 12.1 g/dL (ref 12.0–15.0)
Immature Granulocytes: 1 %
Lymphocytes Relative: 20 %
Lymphs Abs: 1.3 10*3/uL (ref 0.7–4.0)
MCH: 30.3 pg (ref 26.0–34.0)
MCHC: 31.2 g/dL (ref 30.0–36.0)
MCV: 97.2 fL (ref 80.0–100.0)
Monocytes Absolute: 0.7 10*3/uL (ref 0.1–1.0)
Monocytes Relative: 11 %
Neutro Abs: 4.3 10*3/uL (ref 1.7–7.7)
Neutrophils Relative %: 66 %
Platelets: 460 10*3/uL — ABNORMAL HIGH (ref 150–400)
RBC: 3.99 MIL/uL (ref 3.87–5.11)
RDW: 16.7 % — ABNORMAL HIGH (ref 11.5–15.5)
WBC: 6.4 10*3/uL (ref 4.0–10.5)
nRBC: 0 % (ref 0.0–0.2)

## 2020-06-16 LAB — LACTIC ACID, PLASMA
Lactic Acid, Venous: 5.2 mmol/L (ref 0.5–1.9)
Lactic Acid, Venous: 1.7 mmol/L (ref 0.5–1.9)

## 2020-06-16 MED ORDER — METHOCARBAMOL 500 MG PO TABS
500.0000 mg | ORAL_TABLET | Freq: Once | ORAL | Status: AC
  Administered 2020-06-16: 500 mg via ORAL
  Filled 2020-06-16: qty 1

## 2020-06-16 MED ORDER — OXYCODONE HCL 5 MG PO TABS
10.0000 mg | ORAL_TABLET | Freq: Once | ORAL | Status: AC
  Administered 2020-06-16: 10 mg via ORAL
  Filled 2020-06-16: qty 2

## 2020-06-16 MED ORDER — SODIUM CHLORIDE 0.9 % IV BOLUS
1000.0000 mL | Freq: Once | INTRAVENOUS | Status: AC
  Administered 2020-06-16: 1000 mL via INTRAVENOUS

## 2020-06-16 NOTE — ED Notes (Signed)
URINE HAS NOT BEEN OBTAINED IN TRIAGE OR WAITING AREA

## 2020-06-16 NOTE — ED Triage Notes (Signed)
Infection in her right leg that is not responding to oral antibiotics. She is wearing back surgery hardware for a broken neck.

## 2020-06-16 NOTE — ED Notes (Addendum)
Pt requesting to sit in a chair due to bed being uncomfortable due to back brace. Pt denies any dizziness. Pt is hooked up to the monitor with BP cuff, and pulse ox

## 2020-06-16 NOTE — ED Notes (Signed)
Patient verbalizes understanding of discharge instructions. Opportunity for questioning and answers were provided. Armband removed by staff, pt discharged from ED ambulatory to home with a walker.

## 2020-06-16 NOTE — ED Provider Notes (Signed)
[facility] EMERGENCY DEPARTMENT Provider Note   CSN: [id number] Arrival date & time: 06/16/20  1716     History Chief Complaint  Patient presents with  . Recurrent Skin Infections    Leah Franco is a 55 y.o. female past medical history of type 2 diabetes, MRSA, hypertension, recent C1 fracture status post repair, presenting to the emergency department with complaint of skin infection to the right leg. She states rash initially began about 2 weeks ago.  She states she has been treated by PCP and urgent care with doxycycline and Keflex and rash does not improve.  She reports blisters to her right knee and right foot that have been painful.  She denies fevers, chills, or other systemic symptoms.  She states she was diagnosed with an impetigo, she is on the second 10-day course of doxycycline in the end of the first 10-day course of Keflex without improvement.    Reports her doctor was worried considering her past of having MRSA infections seated in her hardware in her back.  This is in consideration of her recent neck surgery.  The history is provided by the patient.       Past Medical History:  Diagnosis Date  . Anemia   . Arthritis   . Back pain   . Diabetes mellitus without complication ([place])   . Fatigue   . Fibroids   . GI bleeding 03/2020  . Hemorrhoid   . Hypertension   . MRSA (methicillin resistant Staphylococcus aureus)   . Night sweats   . Scoliosis   . Type 2 diabetes mellitus ([place])   . Wound infection     Patient Active Problem List   Diagnosis Date Noted  . PAD (peripheral artery disease) ([place]) 04/29/2020  . Cervical spine fracture ([place]) 04/14/2020  . GIB (gastrointestinal bleeding) 03/12/2020  . Arterial insufficiency ([place]) 11/25/2010  . MRSA (methicillin resistant Staphylococcus aureus) 11/25/2010  . COPD (chronic obstructive pulmonary disease) ([place]) 11/25/2010  . CHF (congestive heart failure) ([place]) 11/25/2010  . Spinal cord injury 11/25/2010   . CAD (coronary artery disease) 11/25/2010  . CVA (cerebral infarction) 11/25/2010  . TIA (transient ischemic attack) 11/25/2010  . Diabetes mellitus ([place]) 11/25/2010  . Neuropathy 11/25/2010  . End stage renal disease ([place]) 11/25/2010  . Dialysis care 11/25/2010  . Lupus ([place]) 11/25/2010  . Lymphedema 11/25/2010  . Scoliosis 11/25/2010  . Abscess 11/25/2010    Past Surgical History:  Procedure Laterality Date  . BACK SURGERY     x 9  . BREAST LUMPECTOMY     lft  . ESOPHAGOGASTRODUODENOSCOPY  03/12/2020   HOT HEMOSTASIS (ARGON PLASMA COAGULATION/BICAP) (N/A )  . ESOPHAGOGASTRODUODENOSCOPY (EGD) WITH PROPOFOL N/A 03/12/2020   Procedure: ESOPHAGOGASTRODUODENOSCOPY (EGD) WITH PROPOFOL;  Surgeon: Clarene Essex, MD;  Location: [facility];  Service: Endoscopy;  Laterality: N/A;  . GASTRIC BYPASS    . HALO APPLICATION N/A 48/25/0037   Procedure: HALO TRACTION APPLICATION;  Surgeon: Kary Kos, MD;  Location: [facility];  Service: Neurosurgery;  Laterality: N/A;  . HERNIA REPAIR    . HOT HEMOSTASIS N/A 03/12/2020   Procedure: HOT HEMOSTASIS (ARGON PLASMA COAGULATION/BICAP);  Surgeon: Clarene Essex, MD;  Location: [facility];  Service: Endoscopy;  Laterality: N/A;  . ODONTOID SCREW INSERTION N/A 05/21/2020   Procedure: Intraoperative fluoroscopic reduction of odontoid fracture with light sedation - Cervical one-Cervical two;  Surgeon: Kary Kos, MD;  Location: [facility];  Service: Neurosurgery;  Laterality: N/A;  . SPINE SURGERY  corrective spine collapsed, scoliosis and scoliosis revision  . TOTAL HIP ARTHROPLASTY  2018     OB History   No obstetric history on file.     Family History  Problem Relation Age of Onset  . BRCA 1/2 Mother        BRCA2+ by default, no testing  . Breast cancer Sister 47       BRCA2+  . BRCA 1/2 Sister 25       BRCA2 +  . Pancreatic cancer Maternal Uncle        Dx in his 17s  . BRCA 1/2 Maternal Uncle        BRCA2+  . Prostate cancer Maternal  Grandfather   . Breast cancer Maternal Aunt        dx in 21s  . Ovarian cancer Maternal Aunt        dx in 44s  . Esophageal cancer Maternal Uncle 62  . BRCA 1/2 Maternal Uncle        BRCA2+  . Colon cancer Maternal Uncle 19  . Breast cancer Cousin        dx in her 80s  . BRCA 1/2 Cousin        dx in her 32s; BRCA2+    Social History   Tobacco Use  . Smoking status: Never Smoker  . Smokeless tobacco: Never Used  Vaping Use  . Vaping Use: Never used  Substance Use Topics  . Alcohol use: Yes    Comment: very rare  . Drug use: No    Home Medications Prior to Admission medications   Medication Sig Start Date End Date Taking? Authorizing Provider  atorvastatin (LIPITOR) 10 MG tablet Take 10 mg by mouth every evening.  10/02/19   [provider]  baclofen (LIORESAL) 10 MG tablet Take 10 mg by mouth 3 (three) times daily. 05/16/20   [provider]  butalbital-acetaminophen-caffeine (FIORICET) 50-325-40 MG tablet Take 1-2 tablets by mouth every 4 (four) hours as needed (Pain). 05/21/20   Meyran, Ocie Cornfield, NP  cyclobenzaprine (FLEXERIL) 5 MG tablet Take 5 mg by mouth 2 (two) times daily as needed for muscle spasms. 03/17/20   [provider]  denosumab (PROLIA) 60 MG/ML SOSY injection Inject 60 mg into the skin every 6 (six) months.    [provider]  diphenhydramine-acetaminophen (TYLENOL PM) 25-500 MG TABS tablet Take 2 tablets by mouth at bedtime as needed (Pain).    [provider]  docusate sodium (COLACE) 100 MG capsule Take 100 mg by mouth daily.    [provider]  DULoxetine (CYMBALTA) 60 MG capsule Take 60 mg by mouth every evening. 03/08/20   [provider]  ergocalciferol (VITAMIN D2) 50000 UNITS capsule Take 50,000 Units by mouth See admin instructions. Take one capsule (50000 units) by mouth four times weekly in the evening - Monday, Wednesday, Friday, Saturday    [provider]  etanercept  (ENBREL) 50 MG/ML injection Inject 50 mg into the skin every Tuesday.    [provider]  gabapentin (NEURONTIN) 300 MG capsule Take 600 mg by mouth 3 (three) times daily. At bedtime take with an 800 mg tablet for a total dose of 1400 mg at night    [provider]  gabapentin (NEURONTIN) 800 MG tablet Take 800 mg by mouth at bedtime. Take with 600 mg for a total of 1400 mg at bedtime 11/29/19 11/28/20  [provider]  lisinopril (ZESTRIL) 2.5 MG tablet Take 2.5 mg  by mouth daily. 05/16/20   [provider]  metFORMIN (GLUCOPHAGE) 500 MG tablet Take 1,000 mg by mouth 2 (two) times daily with a meal. 04/05/16 01/14/21  [provider]  methocarbamol (ROBAXIN) 500 MG tablet Take 1 tablet (500 mg total) by mouth 4 (four) times daily. 05/21/20   Meyran, Ocie Cornfield, NP  metoprolol tartrate (LOPRESSOR) 25 MG tablet Take 25 mg by mouth 2 (two) times daily.  03/06/20   [provider]  Oxycodone HCl 10 MG TABS Take 1 tablet (10 mg total) by mouth every 8 (eight) hours. 05/21/20   Meyran, Ocie Cornfield, NP  pantoprazole (PROTONIX) 40 MG tablet Take 1 tablet (40 mg total) by mouth 2 (two) times daily. 03/14/20 03/14/21  Nita Sells, MD  Semaglutide,0.25 or 0.5MG/DOS, (OZEMPIC, 0.25 OR 0.5 MG/DOSE,) 2 MG/1.5ML SOPN Inject 0.5 mg into the skin every Tuesday.  01/15/20   [provider]  sucralfate (CARAFATE) 1 g tablet Take 1 tablet (1 g total) by mouth 4 (four) times daily. 03/14/20 04/15/20  Nita Sells, MD    Allergies    Lactose intolerance (gi) and Tape  Review of Systems   Review of Systems  Constitutional: Negative for chills and fever.  All other systems reviewed and are negative.   Physical Exam Updated Vital Signs BP 107/81 (BP Location: Right Arm)   Pulse 95   Temp 98.5 F (36.9 C) (Oral)   Resp 16   Ht 5' 3.25" (1.607 m)   Wt 76.7 kg   LMP 11/04/2010   SpO2 99%   BMI 29.72 kg/m   Physical Exam Vitals and  nursing note reviewed.  Constitutional:      General: She is not in acute distress.    Appearance: She is well-developed and well-nourished.     Comments: Halo external fixation in place  HENT:     Head: Normocephalic and atraumatic.  Eyes:     Conjunctiva/sclera: Conjunctivae normal.  Cardiovascular:     Rate and Rhythm: Normal rate and regular rhythm.  Pulmonary:     Effort: Pulmonary effort is normal. No respiratory distress.  Abdominal:     General: Bowel sounds are normal.     Palpations: Abdomen is soft.     Tenderness: There is no abdominal tenderness.  Skin:    General: Skin is warm.     Comments: Right leg with generalized mild edema. There is no redness or warmth. There are few small scabs to right knee, no crusting. Dorsal foot along base of toes with clear fluid filled vesicles that are tender. No crusting noted. No purulence. Minimal localized redness at the vesicles, no surrounding cellulitis noted. Dry red patches to bilateral aspects of distal lower leg (reports healing rash) No abscess.   Neurological:     Mental Status: She is alert.  Psychiatric:        Mood and Affect: Mood and affect normal.        Behavior: Behavior normal.     ED Results / Procedures / Treatments   Labs (all labs ordered are listed, but only abnormal results are displayed) Labs Reviewed  LACTIC ACID, PLASMA - Abnormal; Notable for the following components:      Result Value   Lactic Acid, Venous 5.2 (*)    All other components within normal limits  COMPREHENSIVE METABOLIC PANEL - Abnormal; Notable for the following components:   Glucose, Bld 102 (*)    BUN 21 (*)    Calcium 8.5 (*)  Total Protein 6.3 (*)    Albumin 3.0 (*)    Total Bilirubin 0.1 (*)    All other components within normal limits  CBC WITH DIFFERENTIAL/PLATELET - Abnormal; Notable for the following components:   RDW 16.7 (*)    Platelets 460 (*)    All other components within normal limits  CULTURE, BLOOD  (ROUTINE X 2)  CULTURE, BLOOD (ROUTINE X 2)  LACTIC ACID, PLASMA  LACTIC ACID, PLASMA    EKG None  Radiology No results found.  Procedures Procedures   Medications Ordered in ED Medications  sodium chloride 0.9 % bolus 1,000 mL (0 mLs Intravenous Stopped 06/16/20 2040)  methocarbamol (ROBAXIN) tablet 500 mg (500 mg Oral Given 06/16/20 2144)  oxyCODONE (Oxy IR/ROXICODONE) immediate release tablet 10 mg (10 mg Oral Given 06/16/20 2143)    ED Course  I have reviewed the triage vital signs and the nursing notes.  Pertinent labs & imaging results that were available during my care of the patient were reviewed by me and considered in my medical decision making (see chart for details).  Clinical Course as of 06/17/20 0029  Mon Jun 16, 2020  1926 I was directly involved in this patients medical care.  [JH]    Clinical Course User Index [JH] [place], Greggory Brandy, MD   MDM Rules/Calculators/A&P                          Patient presenting to the emergency department with concern for failed outpatient antibiotic therapy of impetigo of her right leg.  Labs were obtained in triage reveal initial lactic acid is elevated, however patient has no systemic symptoms, no fever, no leukocytosis, and no signs of cellulitis to the leg.  She has small few clear fluid-filled vesicles that are painful.  No obvious honey crusting noted.  Repeat lactic acid level is within normal limits.  Patient was discussed with and evaluated by attending physician Dr. Almyra Free.  At this time do not believe admission is indicated given absence of signs of cellulitis or other secondary signs of infection including fever, chills, leukocytosis.  Also considered possibility of herpes zoster? as cause of rash with painful clear fluid-filled blisters along the anterior aspect of the leg.   Blood cultures are sent.  Recommend patient follow closely outpatient with PCP, may also benefit from ID consultation versus dermatology for  identification of rash.  Patient is agreeable with care plan for discharge at this time.  She is instructed of strict return precautions, including development of systemic symptoms, redness to the leg, or other concerning symptoms.  Discussed results, findings, treatment and follow up. Patient advised of return precautions. Patient verbalized understanding and agreed with plan.  Final Clinical Impression(s) / ED Diagnoses Final diagnoses:  Rash and nonspecific skin eruption    Rx / DC Orders ED Discharge Orders    None       Robinson, [place] N, PA-C 06/17/20 0029    Luna Fuse, MD 06/28/20 1506

## 2020-06-16 NOTE — Discharge Instructions (Signed)
As discussed, we do not believe you need admission to the hospital today. Please follow closely with your primary care provider. You can continue the antibiotics as directed. You may also benefit from seeing a dermatologist. Please return to the emergency department if you develop fever/chills, redness to your leg, or other concerning symptoms.

## 2020-06-21 LAB — CULTURE, BLOOD (ROUTINE X 2)
Culture: NO GROWTH
Culture: NO GROWTH
Special Requests: ADEQUATE
Special Requests: ADEQUATE

## 2020-06-27 ENCOUNTER — Other Ambulatory Visit: Payer: Self-pay | Admitting: Neurosurgery

## 2020-06-27 DIAGNOSIS — S12100A Unspecified displaced fracture of second cervical vertebra, initial encounter for closed fracture: Secondary | ICD-10-CM

## 2020-07-14 ENCOUNTER — Ambulatory Visit
Admission: RE | Admit: 2020-07-14 | Discharge: 2020-07-14 | Disposition: A | Discharge status: Home / Self Care | Payer: PRIVATE HEALTH INSURANCE | Source: Ambulatory Visit | Attending: Neurosurgery | Admitting: Neurosurgery

## 2020-07-14 DIAGNOSIS — S12100A Unspecified displaced fracture of second cervical vertebra, initial encounter for closed fracture: Secondary | ICD-10-CM

## 2021-01-05 IMAGING — DX DG CERVICAL SPINE 1V
1 series · 1 of 1 positions shown · non-contrast
Comparison: Plain film from 04/17/2020 as well as prior CT from
04/14/2020

CLINICAL DATA: History of cervical spine fracture

EXAM:
DG CERVICAL SPINE - 1 VIEW

[cspine]
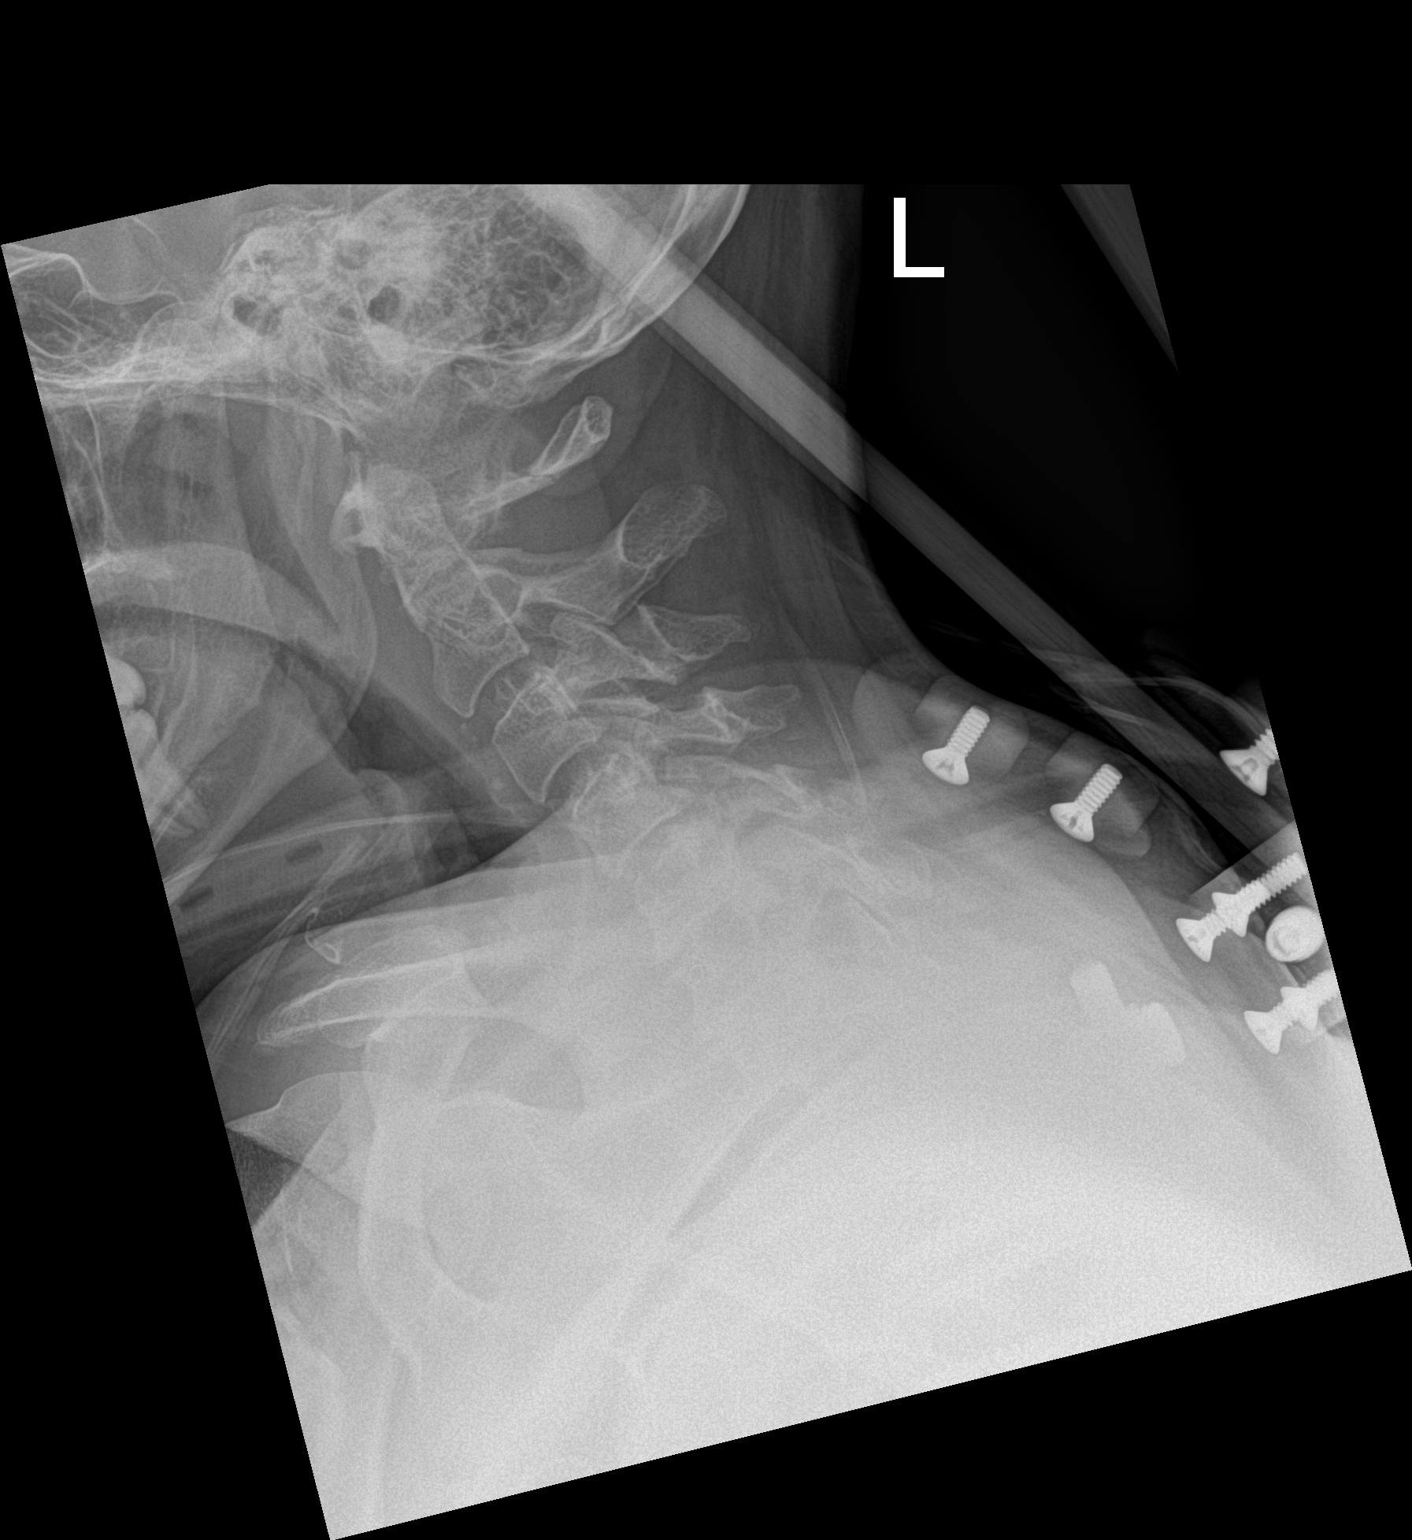

[1 of 1 positions shown; findings below may reference images not displayed]

FINDINGS: There again noted fractures involving the C1 ring as well as the
odontoid similar to that seen on prior CT examination. The
previously seen displacement of the odontoid has been reduced. The
C5 laminar fractures are not as well appreciated on today's exam.
Prior chronic spinous process fractures at C6 and C7 are noted.
Displacement of the C5 spinous process is noted.
IMPRESSION: Interval reduction of the odontoid fracture when compared with the
prior plain film. Fractures involving C1-C2 and C5 are seen. Chronic
fractures involving C6 and C7 are noted

## 2021-01-12 IMAGING — DX DG ABDOMEN 1V
1 series · 1 of 1 positions shown · non-contrast
Comparison: None.

CLINICAL DATA: Abdominal distension

EXAM:
ABDOMEN - 1 VIEW

[abdomen kub]
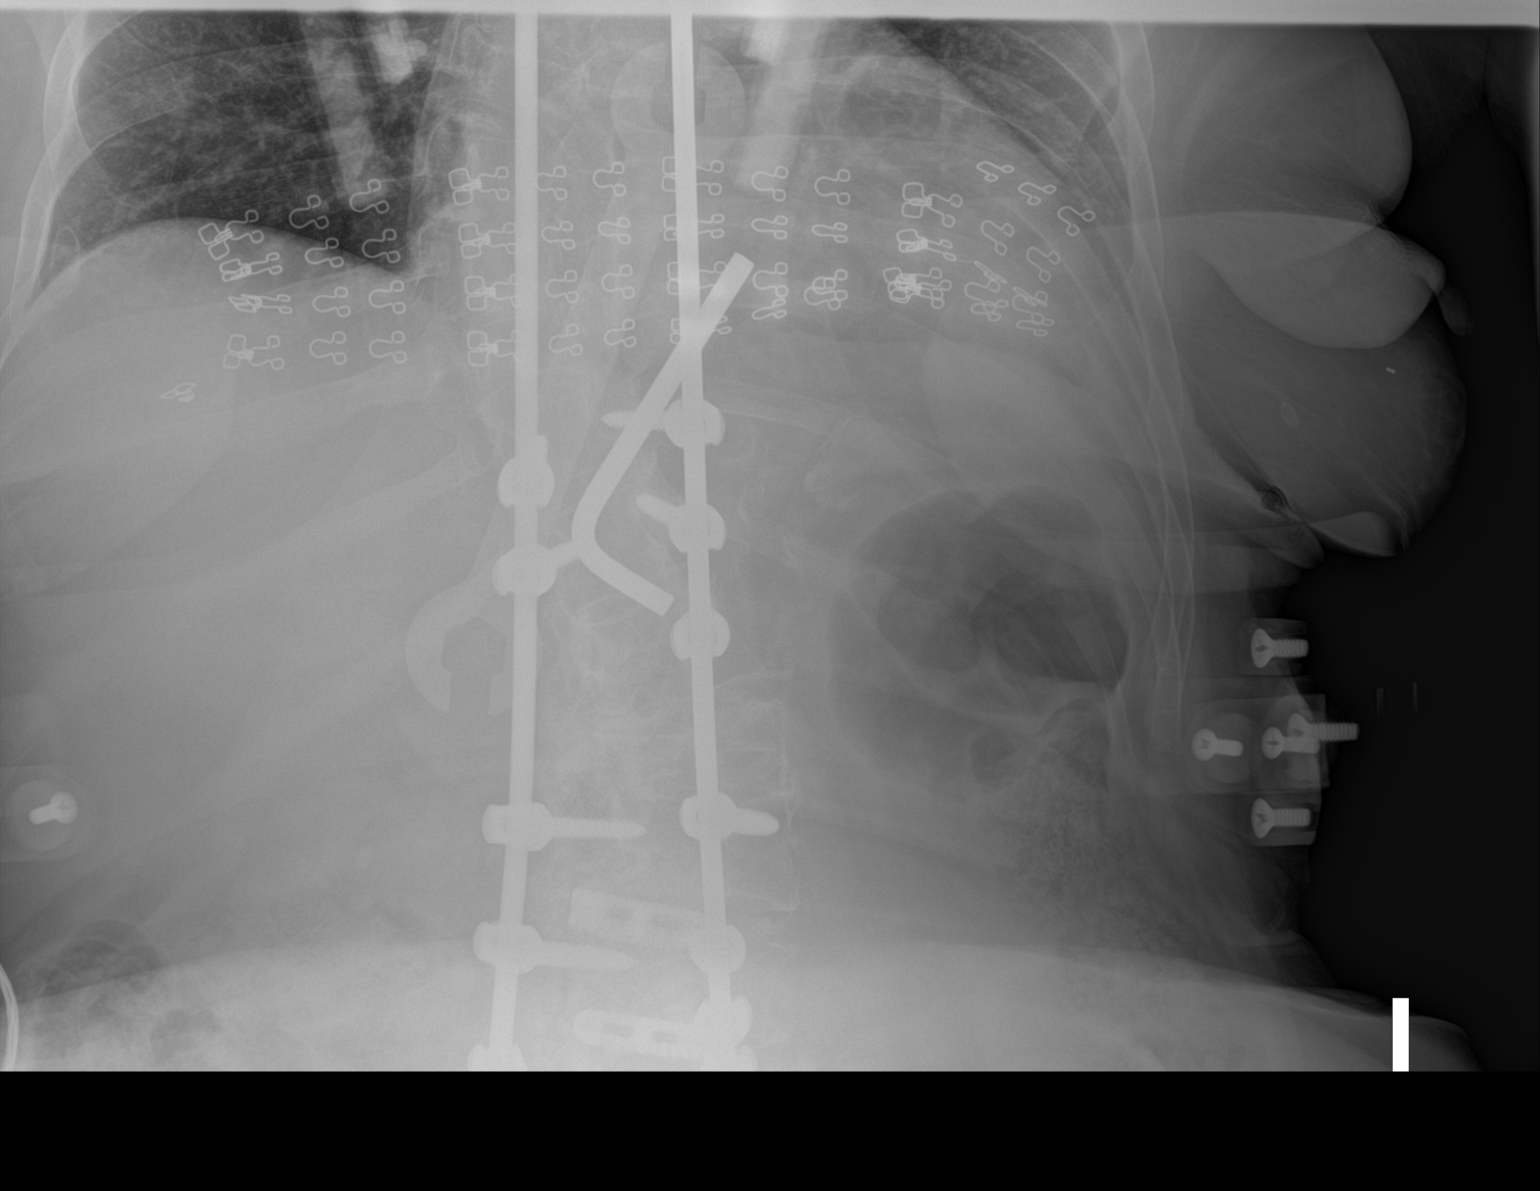

[1 of 1 positions shown; findings below may reference images not displayed]

FINDINGS: Suboptimal evaluation due to limitations on positioning and portable
technique with body habitus.

Visualized bowel gas pattern is unremarkable. Stool is present
within the included colon. Extensive fusion construct for scoliosis.
IMPRESSION: Nonobstructive bowel gas pattern.

## 2021-02-02 IMAGING — RF DG CERVICAL SPINE 1V
1 series · 1 of 1 positions shown · non-contrast
Comparison: Radiographs October 18, 2020

CLINICAL DATA: Close reduction, halo adjustment.

EXAM:
DG C-ARM 1-60 MIN; DG CERVICAL SPINE - 1 VIEW
FLUOROSCOPY TIME:  Fluoroscopy Time:  30 seconds.
Reported radiation: 1.92 mGy.

[Series 1: run · 1 of 1 slices shown]
[im 1/1]
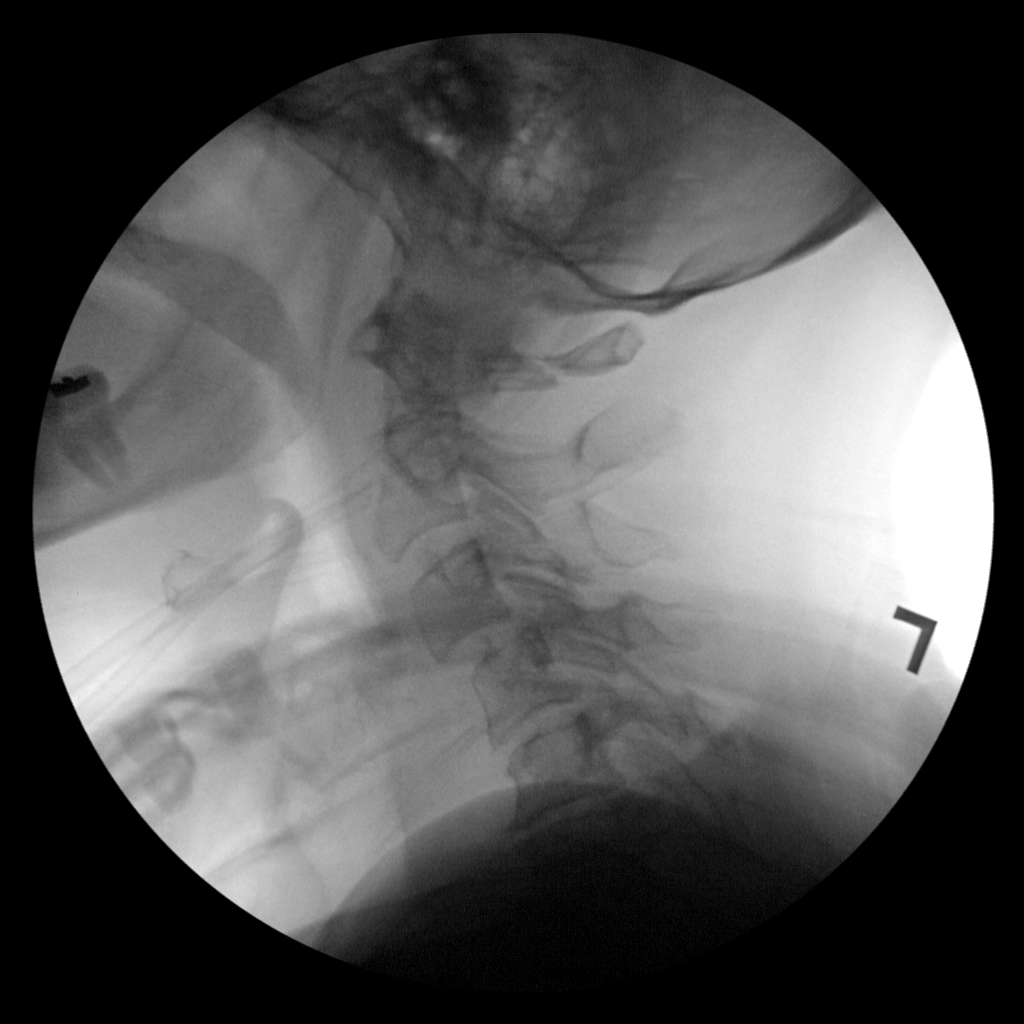

[1 of 1 positions shown; findings below may reference images not displayed]

FINDINGS: A single C-arm fluoroscopic images were obtained intraoperatively
and submitted for post operative interpretation. This radiograph
demonstrates slight retrolisthesis of the odontoid fracture relative
to the C2 vertebral body, improved relative to cervical radiographs
from 05/20/2020 otherwise, similar alignment. Please see the
performing provider's procedural report for further detail.
IMPRESSION: Single intraoperative fluoroscopic image, as detailed above.

## 2021-03-28 IMAGING — CT CT CERVICAL SPINE W/O CM
4 of 5 series · 14 of 33 positions shown, 16 images · non-contrast
Comparison: Cervical radiographs July 01, 2020

CLINICAL DATA: Follow-up fractures. Eval for healing. Patient in
halo.

EXAM:
CT CERVICAL SPINE WITHOUT CONTRAST
TECHNIQUE: Multidetector CT imaging of the cervical spine was performed without
intravenous contrast. Multiplanar CT image reconstructions were also
generated.

[Series 3: c-spine 2.00 br60 s3 axial bone · axial · 0.34mm/px · z∈[-768,-684]mm · 3 of 86 slices shown, 4 images]
[im 22/86  soft-tissue]
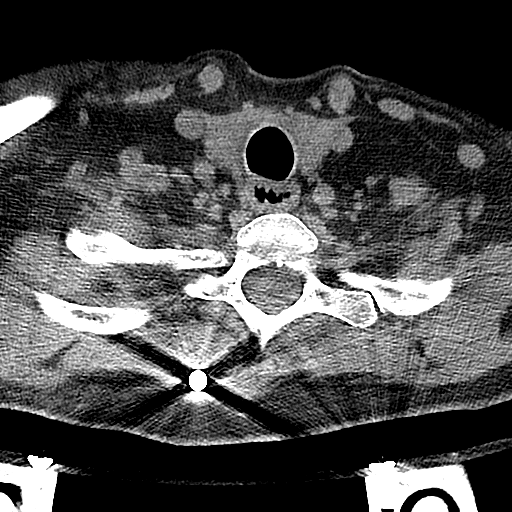
[im 22/86  bone]
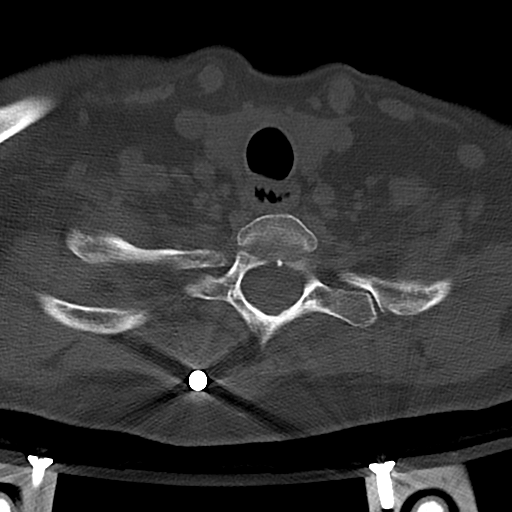
[im 43/86  bone]
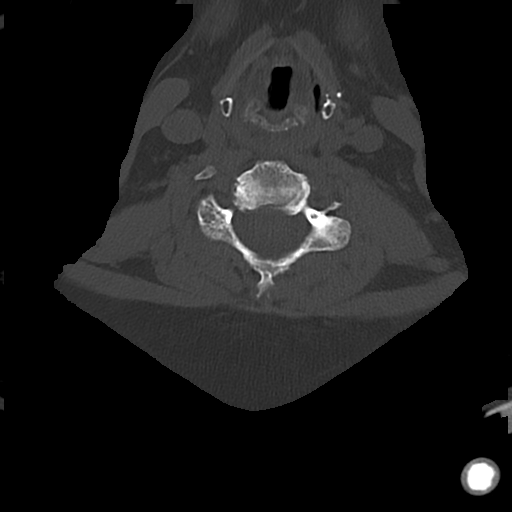
[im 64/86  bone]
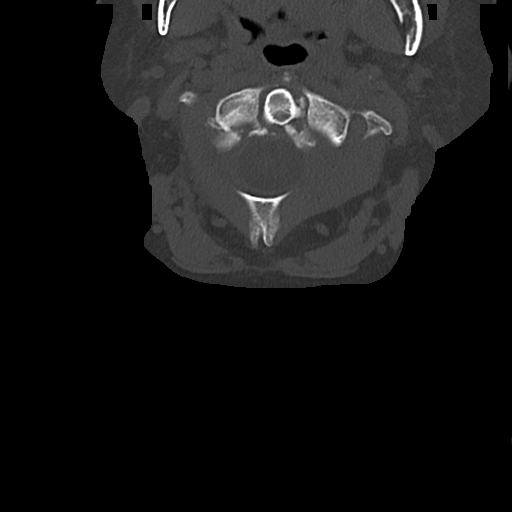

[Series 4: c-spine 2.00 br40 s3 axial (person_name) · axial · 0.34mm/px · z∈[-768,-684]mm · 3 of 86 slices shown]
[im 22/86  bone]
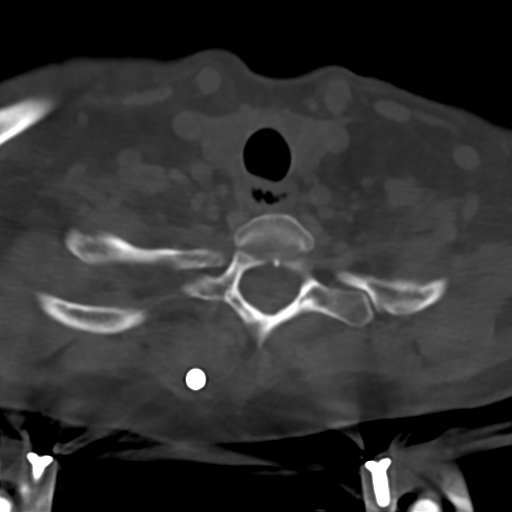
[im 43/86  bone]
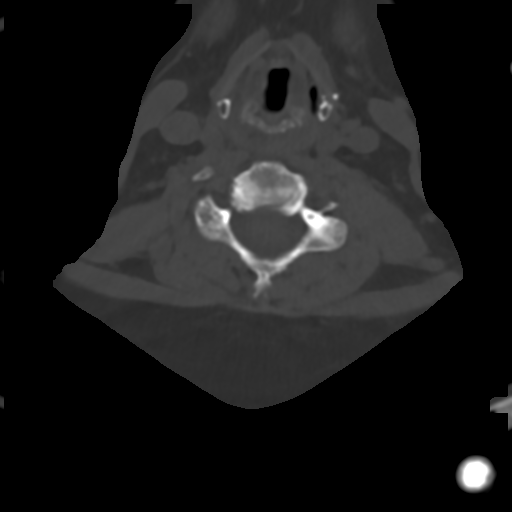
[im 64/86  bone]
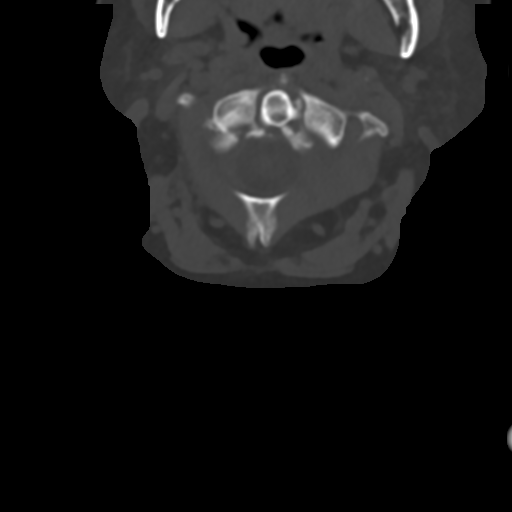

[Series 5: c-spine 2.00 br60 s3 sag bone · sagittal · 0.32mm/px · 5 of 71 slices shown, 6 images]
[im 24/71  bone]
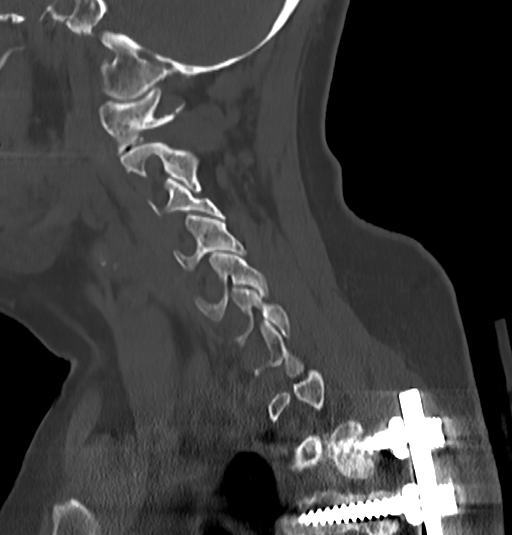
[im 30/71  bone]
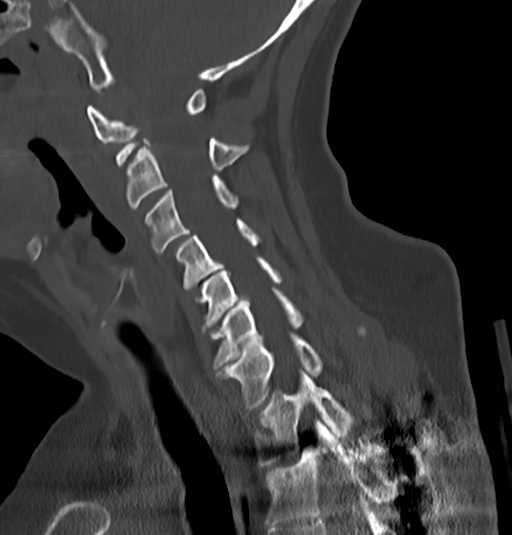
[im 36/71  soft-tissue]
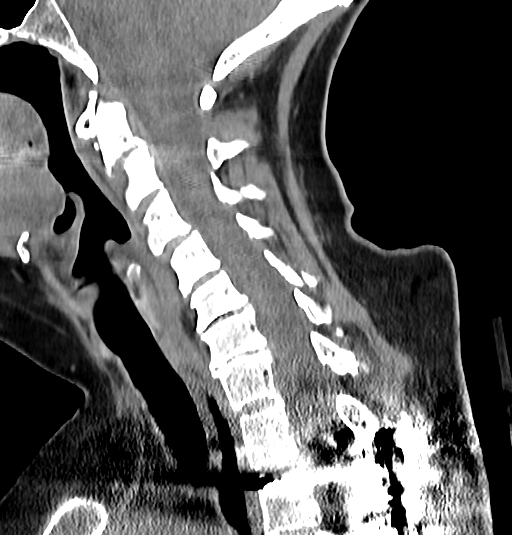
[im 36/71  bone]
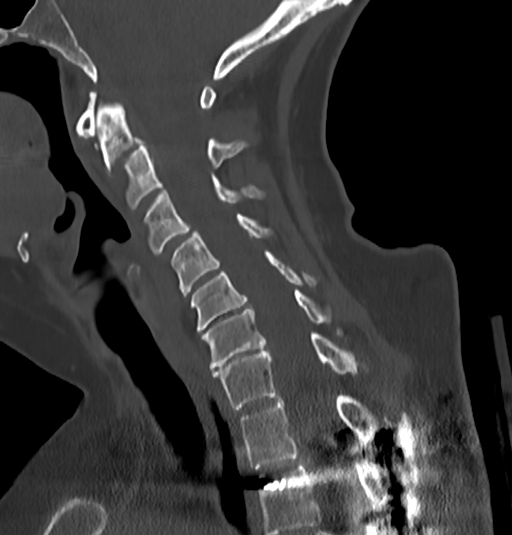
[im 41/71  bone]
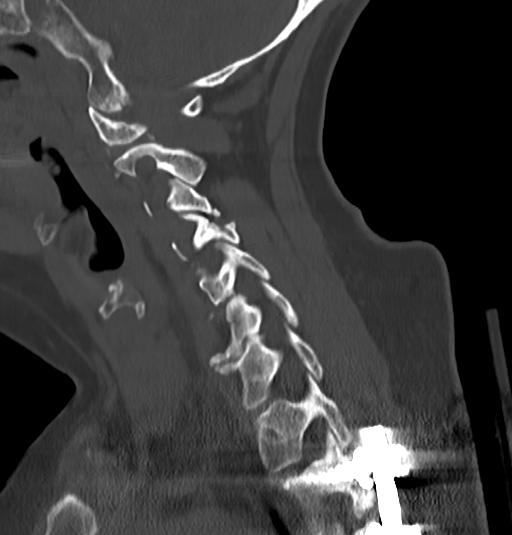
[im 47/71  bone]
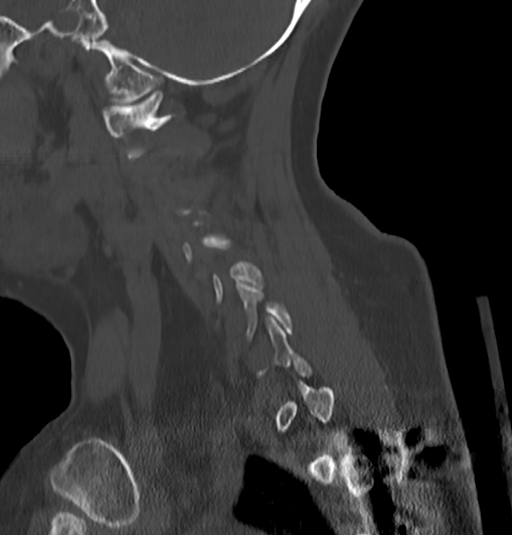

[Series 7: c-spine 2.00 hr60 s3 cor bone · coronal · 0.28mm/px · 3 of 75 slices shown]
[im 15/75  bone]
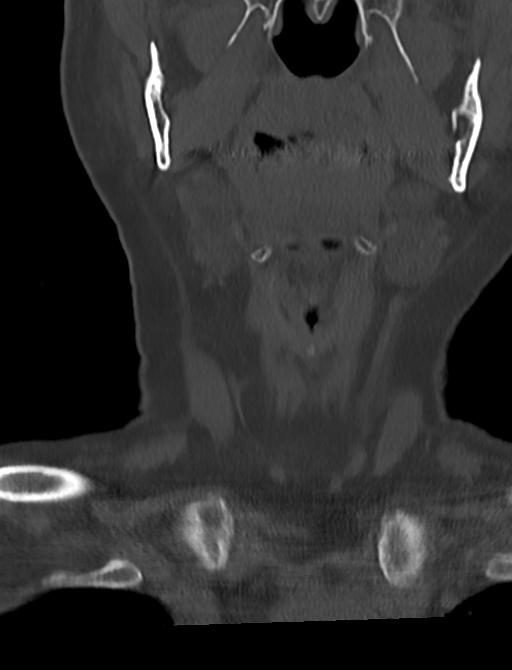
[im 30/75  bone]
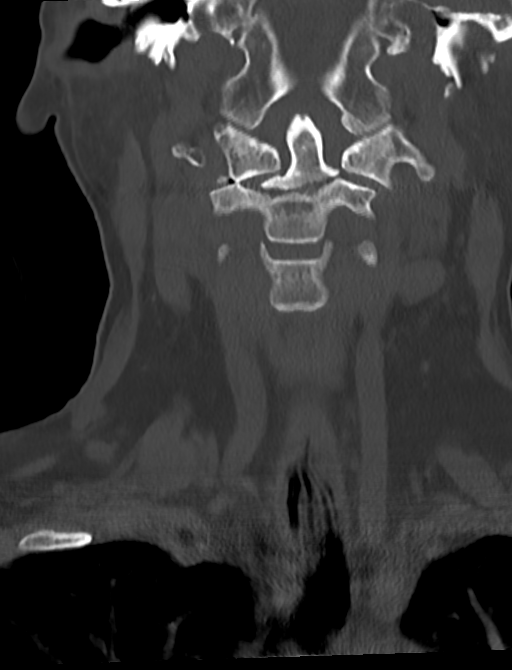
[im 45/75  bone]
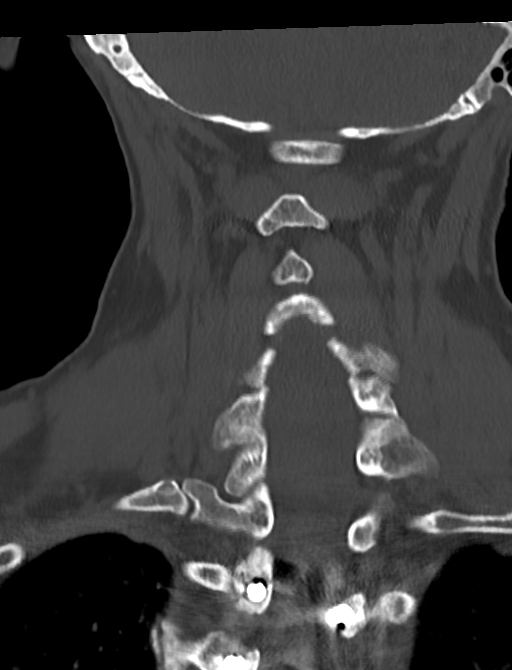

[14 of 33 positions shown; findings below may reference images not displayed]

FINDINGS: Alignment: 3 mm of C3-C4 anterolisthesis. Similar mild widening of
the left C3-C4 facet joint. Traumatic malalignment at C1-C2 is
detailed below.

Skull base and vertebrae: Redemonstrated fracture of the C1
posterior arch is bilaterally, nondisplaced. Redemonstrated type 3
dens fracture. Sagittally, there is approximately 3 mm of anterior
displacement of the dens, which is slightly increased in comparison
to CT from 04/14/2020, but likely slightly decreased in comparison
to most recent radiographs from July 01, 2020 when comparing
across modalities. There is a new fracture involving the
inferolateral aspect of the right C1 lateral mass with new 8 mm of
left lateral subluxation of C1 relative to C2 (series 7, image 37),
compatible with atlantoaxial dislocation and ligamentous injury at
this level. The dens fragment is laterally translated with C1 and
there is no evidence of bony fusion across the dens fracture.

Healing fracture of the left C5 lamina extending to the spinous
process.

Redemonstrated chronic fractures of the C6 and C7 spinous processes.

Partially imaged thoracic spinal fusion hardware.

Soft tissues and spinal canal: No prevertebral fluid or swelling. No
visible canal hematoma.

Disc levels: Multilevel degenerative change, better characterized on
recent MRI cervical spine.

Upper chest: Visualized lung apices are clear.
IMPRESSION: 1. Redemonstrated type 3 dens fracture and posterior C1 arch
fractures, which are unhealed. There is a new displaced fracture
involving the inferolateral aspect of the right C1 lateral mass with
new 8 mm of left lateral subluxation of C1 relative to C2 (series 7,
image 37), compatible with atlantoaxial dislocation and ligamentous
injury. The dens fragment is laterally translated with C1. A repeat
MRI could further characterize the extent of ligamentous injury if
clinically indicated.
2. Healing fracture of the left C5 lamina extending to the spinous
process.
3. Chronic C6 and C7 spinous this process fractures.

Findings discussed with Dr. Sotianize via telephone at [DATE]

## 2022-12-04 ENCOUNTER — Other Ambulatory Visit: Payer: Self-pay

## 2022-12-04 ENCOUNTER — Encounter (HOSPITAL_BASED_OUTPATIENT_CLINIC_OR_DEPARTMENT_OTHER): Payer: Self-pay

## 2022-12-04 DIAGNOSIS — S91115A Laceration without foreign body of left lesser toe(s) without damage to nail, initial encounter: Secondary | ICD-10-CM | POA: Insufficient documentation

## 2022-12-04 DIAGNOSIS — X58XXXA Exposure to other specified factors, initial encounter: Secondary | ICD-10-CM | POA: Insufficient documentation

## 2022-12-04 NOTE — ED Triage Notes (Signed)
Pt reports to the ED with complaints of a toe injury. Pt reports that she has drop foot and that she was getting ready for shower and then she heard a pop and noted her left foot 2nd toe was bleeding and that she thinks she broke it. Blood noted to toe.

## 2022-12-05 ENCOUNTER — Emergency Department (HOSPITAL_BASED_OUTPATIENT_CLINIC_OR_DEPARTMENT_OTHER)
Admission: EM | Admit: 2022-12-05 | Discharge: 2022-12-05 | Disposition: A | Discharge status: Home / Self Care | Payer: PRIVATE HEALTH INSURANCE | Attending: Emergency Medicine | Admitting: Emergency Medicine

## 2022-12-05 ENCOUNTER — Emergency Department (HOSPITAL_BASED_OUTPATIENT_CLINIC_OR_DEPARTMENT_OTHER): Payer: PRIVATE HEALTH INSURANCE

## 2022-12-05 DIAGNOSIS — S91115A Laceration without foreign body of left lesser toe(s) without damage to nail, initial encounter: Secondary | ICD-10-CM

## 2022-12-05 HISTORY — DX: Foot drop, left foot: M21.372

## 2022-12-05 MED ORDER — FLUCONAZOLE 150 MG PO TABS: 150.0000 mg | ORAL_TABLET | ORAL | 0 refills | Status: AC

## 2022-12-05 MED ORDER — LIDOCAINE HCL 2 % IJ SOLN
10.0000 mL | Freq: Once | INTRAMUSCULAR | Status: AC
  Administered 2022-12-05: 200 mg
  Filled 2022-12-05: qty 20

## 2022-12-05 MED ORDER — CEPHALEXIN 500 MG PO CAPS: 500.0000 mg | ORAL_CAPSULE | Freq: Two times a day (BID) | ORAL | 0 refills | Status: AC

## 2022-12-05 NOTE — ED Provider Notes (Signed)
[place] EMERGENCY DEPARTMENT AT [facility] [place]  Provider Note  CSN: [id number] Arrival date & time: 12/04/22 2343  History Chief Complaint  Patient presents with  . Toe Injury    Leah Franco is a 57 y.o. 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 She actually has more pain in the L 3rd toe. Unsure how she might have injured it.    Home Medications Prior to Admission medications   Medication Sig Start Date End Date Taking? Authorizing Provider  cephALEXin (KEFLEX) 500 MG capsule Take 1 capsule (500 mg total) by mouth 2 (two) times daily for 7 days. 12/05/22 12/12/22 Yes Pollyann Savoy, MD  fluconazole (DIFLUCAN) 150 MG tablet Take 1 tablet (150 mg total) by mouth once a week for 2 doses. 12/05/22 12/13/22 Yes Pollyann Savoy, MD  atorvastatin (LIPITOR) 10 MG tablet Take 10 mg by mouth every evening.  10/02/19   [provider]  baclofen (LIORESAL) 10 MG tablet Take 10 mg by mouth 3 (three) times daily. 05/16/20   [provider]  butalbital-acetaminophen-caffeine (FIORICET) 50-325-40 MG tablet Take 1-2 tablets by mouth every 4 (four) hours as needed (Pain). 05/21/20   Meyran, Tiana Loft, NP  cyclobenzaprine (FLEXERIL) 5 MG tablet Take 5 mg by mouth 2 (two) times daily as needed for muscle spasms. 03/17/20   [provider]  denosumab (PROLIA) 60 MG/ML SOSY injection Inject 60 mg into the skin every 6 (six) months.    [provider]  diphenhydramine-acetaminophen (TYLENOL PM) 25-500 MG TABS tablet Take 2 tablets by mouth at bedtime as needed (Pain).    [provider]  docusate sodium (COLACE) 100 MG capsule Take 100 mg by mouth daily.    [provider]  DULoxetine (CYMBALTA) 60 MG capsule Take 60 mg by mouth every evening. 03/08/20   [provider]  ergocalciferol (VITAMIN D2) 50000 UNITS capsule Take  50,000 Units by mouth See admin instructions. Take one capsule (50000 units) by mouth four times weekly in the evening - Monday, Wednesday, Friday, Saturday    [provider]  etanercept (ENBREL) 50 MG/ML injection Inject 50 mg into the skin every Tuesday.    [provider]  gabapentin (NEURONTIN) 300 MG capsule Take 600 mg by mouth 3 (three) times daily. At bedtime take with an 800 mg tablet for a total dose of 1400 mg at night    [provider]  gabapentin (NEURONTIN) 800 MG tablet Take 800 mg by mouth at bedtime. Take with 600 mg for a total of 1400 mg at bedtime 11/29/19 11/28/20  [provider]  lisinopril (ZESTRIL) 2.5 MG tablet Take 2.5 mg by mouth daily. 05/16/20   [provider]  metFORMIN (GLUCOPHAGE) 500 MG tablet Take 1,000 mg by mouth 2 (two) times daily with a meal. 04/05/16 01/14/21  [provider]  methocarbamol (ROBAXIN) 500 MG tablet Take 1 tablet (500 mg total) by mouth 4 (four) times daily. 05/21/20   Meyran, Tiana Loft, NP  metoprolol tartrate (LOPRESSOR) 25 MG tablet Take 25 mg by mouth 2 (two) times daily.  03/06/20   [provider]  Oxycodone HCl 10 MG TABS Take 1 tablet (10 mg total) by mouth every 8 (eight) hours. 05/21/20   Meyran, Tiana Loft, NP  pantoprazole (PROTONIX) 40 MG tablet Take 1 tablet (40 mg total) by  mouth 2 (two) times daily. 03/14/20 03/14/21  Rhetta Mura, MD  Semaglutide,0.25 or 0.5MG /DOS, (OZEMPIC, 0.25 OR 0.5 MG/DOSE,) 2 MG/1.5ML SOPN Inject 0.5 mg into the skin every Tuesday.  01/15/20   [provider]  sucralfate (CARAFATE) 1 g tablet Take 1 tablet (1 g total) by mouth 4 (four) times daily. 03/14/20 04/15/20  Rhetta Mura, MD     Allergies    Lactose intolerance (gi) and Tape   Review of Systems   Review of Systems Please see HPI for pertinent positives and negatives  Physical Exam BP (!) 121/91 (BP Location: Right Arm)   Pulse 88   Temp 98.6 F (37  C) (Oral)   Resp 18   Ht 5\' 2"  (1.575 m)   Wt 83 kg   LMP 11/04/2010   SpO2 99%   BMI 33.47 kg/m   Physical Exam Vitals and nursing note reviewed.  HENT:     Head: Normocephalic.     Nose: Nose normal.  Eyes:     Extraocular Movements: Extraocular movements intact.  Pulmonary:     Effort: Pulmonary effort is normal.  Musculoskeletal:        General: Normal range of motion.     Cervical back: Neck supple.     Comments: There is a laceration across the dorsal L 2nd toe which appears to extends into the IP joint capsule  Skin:    Findings: No rash (on exposed skin).  Neurological:     Mental Status: She is alert and oriented to person, place, and time.  Psychiatric:        Mood and Affect: Mood normal.     ED Results / Procedures / Treatments   EKG None  Procedures .Marland KitchenLaceration Repair  Date/Time: 12/05/2022 1:24 AM  Performed by: Pollyann Savoy, MD Authorized by: Pollyann Savoy, MD   Consent:    Consent obtained:  Verbal   Consent given by:  Patient Anesthesia:    Anesthesia method:  None Laceration details:    Location:  Toe   Toe location:  L second toe   Length (cm):  2.6 Pre-procedure details:    Preparation:  Patient was prepped and draped in usual sterile fashion Treatment:    Area cleansed with:  Saline   Irrigation solution:  Sterile saline   Irrigation volume:  100cc   Irrigation method:  Syringe Skin repair:    Repair method:  Sutures   Suture size:  5-0   Suture material:  Prolene   Suture technique:  Simple interrupted   Number of sutures:  5 Approximation:    Approximation:  Close Repair type:    Repair type:  Simple Post-procedure details:    Dressing:  Non-adherent dressing   Procedure completion:  Tolerated well, no immediate complications   Medications Ordered in the ED Medications  lidocaine (XYLOCAINE) 2 % (with pres) injection 200 mg (200 mg Infiltration Given by Other 12/05/22 0045)    Initial Impression and Plan   Patient with foot drop here with L 2nd toe injury. I personally viewed the images from radiology studies and agree with radiologist interpretation: Xray shows an old fracture, she reports she has previously suspected she'd broken her toe, but never had it evaluated. Wound is into the joint capsule. Repaired as above, did not require analgesia due to prior nerve damage. Plan Abx to prevent infection, Ortho follow up.   ED Course   Clinical Course as of 12/05/22 0148  Wynelle Link Dec 05, 2022  1308  TDAP UTD [CS]    Clinical Course User Index [CS] Pollyann Savoy, MD     MDM Rules/Calculators/A&P Medical Decision Making Problems Addressed: Laceration of lesser toe of left foot without foreign body present or damage to nail, initial encounter: acute illness or injury  Amount and/or Complexity of Data Reviewed Radiology: ordered and independent interpretation performed. Decision-making details documented in ED Course.  Risk Prescription drug management.     Final Clinical Impression(s) / ED Diagnoses Final diagnoses:  Laceration of lesser toe of left foot without foreign body present or damage to nail, initial encounter    Rx / DC Orders ED Discharge Orders          Ordered    cephALEXin (KEFLEX) 500 MG capsule  2 times daily        12/05/22 0127    fluconazole (DIFLUCAN) 150 MG tablet  Weekly        12/05/22 0127             Pollyann Savoy, MD 12/05/22 [phone]

## 2022-12-05 NOTE — ED Notes (Signed)
Dressing applied to left foot 2nd toe

## 2024-03-08 ENCOUNTER — Encounter (HOSPITAL_COMMUNITY): Payer: Self-pay

## 2024-03-08 ENCOUNTER — Emergency Department (HOSPITAL_COMMUNITY): Payer: PRIVATE HEALTH INSURANCE

## 2024-03-08 ENCOUNTER — Emergency Department (HOSPITAL_COMMUNITY)
Admission: EM | Admit: 2024-03-08 | Discharge: 2024-03-08 | Disposition: A | Discharge status: Home / Self Care | Payer: PRIVATE HEALTH INSURANCE

## 2024-03-08 ENCOUNTER — Other Ambulatory Visit: Payer: Self-pay

## 2024-03-08 DIAGNOSIS — W108XXA Fall (on) (from) other stairs and steps, initial encounter: Secondary | ICD-10-CM | POA: Insufficient documentation

## 2024-03-08 DIAGNOSIS — W19XXXA Unspecified fall, initial encounter: Secondary | ICD-10-CM

## 2024-03-08 DIAGNOSIS — Y92019 Unspecified place in single-family (private) house as the place of occurrence of the external cause: Secondary | ICD-10-CM | POA: Insufficient documentation

## 2024-03-08 DIAGNOSIS — S0990XA Unspecified injury of head, initial encounter: Secondary | ICD-10-CM | POA: Diagnosis present

## 2024-03-08 DIAGNOSIS — S0003XA Contusion of scalp, initial encounter: Secondary | ICD-10-CM | POA: Diagnosis not present

## 2024-03-08 NOTE — ED Triage Notes (Signed)
 Pt BIB [facility] from home for un witness fall, pt has drop foot, slipped on last step and fell, hematoma on left back side of head, denies LOC, last blood thinner taken 02/14/2024 Hx of back and neck pain, did not get worse.  144/92 HR 100 96% RA CBG 252

## 2024-03-08 NOTE — ED Provider Notes (Signed)
 [place] EMERGENCY DEPARTMENT AT [facility] Provider Note   CSN: [id number] Arrival date & time: 03/08/24  8182     Patient presents with: Fall   Leah Franco is a 58 y.o. female.   The history is provided by the patient and medical records. No language interpreter was used.  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  Patient was brought here via EMS from home.  A few hours ago patient was walking down the steps in her garage when her left foot got caught on the steps and she fell forward striking her head against the ground.  She denies any loss of consciousness but she did need help to get up.  She did strike her head and complaining of pain to the left side of her head.  She denies any precipitating symptoms prior to the fall and she does not think she had any loss of consciousness.  She was previously taking Xarelto for DVT in her left leg but has stopped the medication approximately 4 days ago.  She did not complain of any pain in her chest or trouble breathing no abdominal pain.  She denies any new numbness or new weakness.  She does wear fentanyl  patch for her chronic pain and does have a pain contract.  She is up-to-date with tetanus.  She mention she normally wears a foot brace when she leaves the house to go to places but she did not wear her foot brace today.  EMS did place a c-collar prior to arrival.  Prior to Admission medications   Medication Sig Start Date End Date Taking? Authorizing Provider  atorvastatin  (LIPITOR) 10 MG tablet Take 10 mg by mouth every evening.  10/02/19   [provider]  baclofen (LIORESAL) 10 MG tablet Take 10 mg by mouth 3 (three) times daily. 05/16/20   [provider]  butalbital -acetaminophen -caffeine  (FIORICET) 50-325-40 MG tablet Take 1-2 tablets by mouth every 4 (four) hours as needed (Pain). 05/21/20   Meyran, Suzen Lacks, NP  cyclobenzaprine  (FLEXERIL ) 5 MG tablet Take 5 mg by mouth 2 (two) times daily as needed for muscle spasms. 03/17/20   [provider]  denosumab (PROLIA) 60 MG/ML SOSY injection Inject 60 mg into the skin every 6 (six) months.    [provider]  diphenhydramine -acetaminophen  (TYLENOL  PM) 25-500 MG TABS tablet Take 2 tablets by mouth at bedtime as needed (Pain).    [provider]  docusate sodium  (COLACE) 100 MG capsule Take 100 mg by mouth daily.    [provider]  DULoxetine  (CYMBALTA ) 60 MG capsule Take 60 mg by mouth every evening. 03/08/20   [provider]  ergocalciferol  (VITAMIN D2) 50000 UNITS capsule Take 50,000 Units by mouth See admin instructions. Take one capsule (50000 units) by mouth four times weekly in the evening - Monday, Wednesday, Friday, Saturday    [provider]  etanercept  (ENBREL ) 50 MG/ML injection Inject 50 mg into the skin every Tuesday.    [provider]  gabapentin  (NEURONTIN ) 300 MG capsule Take 600 mg by mouth 3 (three) times daily. At bedtime take with an 800 mg tablet for a total dose of 1400 mg at night    [provider]  gabapentin  (NEURONTIN ) 800 MG tablet Take 800 mg by mouth at bedtime. Take with 600 mg for a total of 1400 mg at bedtime 11/29/19 11/28/20  [provider]  lisinopril  (ZESTRIL ) 2.5 MG tablet Take 2.5 mg by mouth daily. 05/16/20   [provider]  metFORMIN  (GLUCOPHAGE ) 500 MG tablet Take 1,000 mg by mouth 2 (two) times daily with a meal. 04/05/16 01/14/21  [provider]  methocarbamol  (ROBAXIN ) 500 MG tablet Take 1 tablet (500 mg total) by mouth 4 (four) times daily. 05/21/20   Meyran, Suzen Lacks, NP  metoprolol  tartrate (LOPRESSOR ) 25 MG tablet Take 25 mg by mouth 2 (two) times daily.  03/06/20   [provider]  Oxycodone  HCl 10 MG TABS Take 1 tablet (10 mg total) by mouth every 8 (eight) hours. 05/21/20   Meyran, Suzen Lacks, NP  pantoprazole  (PROTONIX ) 40 MG tablet Take 1 tablet (40 mg total) by mouth 2 (two) times daily. 03/14/20 03/14/21  Samtani, Jai-Gurmukh, MD  Semaglutide ,0.25 or 0.5MG /DOS, (OZEMPIC , 0.25 OR 0.5 MG/DOSE,) 2 MG/1.5ML SOPN Inject 0.5 mg into the skin every Tuesday.  01/15/20   [provider]  sucralfate  (CARAFATE ) 1 g tablet Take 1 tablet (1 g total) by mouth 4 (four) times daily. 03/14/20 04/15/20  Samtani, Jai-Gurmukh, MD    Allergies: Lactose intolerance (gi) and Tape    Review of Systems  All other systems reviewed and are negative.   Updated Vital Signs BP (!) 154/110 (BP Location: Right Arm)   Pulse 89   Temp (!) 97.2 F (36.2 C) (Oral)   Resp 17   Ht 5' 2 (1.575 m)   Wt 81.6 kg   LMP 11/04/2010   SpO2 100%   BMI 32.92 kg/m   Physical Exam Vitals and nursing note reviewed.  Constitutional:      General: She is not in acute distress.    Appearance: She is well-developed.  HENT:     Head: Normocephalic.     Comments: A moderate size left parietal scalp hematoma noted with a small skin tear but no active bleeding.  Area is tender to palpation.  No battle signs or raccoon's eyes.  No midface tenderness. Eyes:     Extraocular Movements: Extraocular movements intact.     Conjunctiva/sclera: Conjunctivae normal.     Pupils: Pupils are equal, round, and reactive to light.  Neck:     Comments: C-collar in place.  No significant midline spine tenderness crepitus or step-off noted. Cardiovascular:     Rate and Rhythm: Normal rate and regular rhythm.     Pulses: Normal pulses.     Heart sounds: Normal heart sounds.  Pulmonary:     Effort: Pulmonary effort is normal.  Musculoskeletal:     Cervical back: Normal range of motion and neck supple.     Comments: Able to move all 4 extremities.  Left foot drop noted.  Skin:    Findings: No rash.  Neurological:     Mental Status: She is alert and oriented to person, place, and time.  Psychiatric:        Mood and  Affect: Mood normal.     (all labs ordered are listed, but only abnormal results are displayed) Labs Reviewed - No data to display  EKG: None  Radiology: CT Head Wo Contrast Result Date: 03/08/2024 EXAM: CT HEAD AND CERVICAL SPINE 03/08/2024 07:38:00 PM TECHNIQUE: CT of the head and cervical spine was performed without the administration of intravenous contrast. Multiplanar reformatted images are provided for review. Automated exposure control, iterative reconstruction, and/or weight based adjustment of the mA/kV was utilized to reduce the radiation dose to as low as reasonably  achievable. COMPARISON: CT thoracic spine 10/22/2020, CT cervical spine 07/14/2020. CLINICAL HISTORY: Head trauma, moderate-severe. FINDINGS: CT HEAD BRAIN AND VENTRICLES: Patchy and confluent areas of decreased attenuation are noted throughout the deep and periventricular white matter of the cerebral hemispheres bilaterally, suggestive of chronic microvascular ischemic changes. No acute intracranial hemorrhage. No mass effect or midline shift. No abnormal extra-axial fluid collection. No evidence of acute infarct. No hydrocephalus. ORBITS: No acute abnormality. SINUSES AND MASTOIDS: No acute abnormality. SOFT TISSUES AND SKULL: 2.5 cm acute left occipital scalp hematoma. No retained radiopaque foreign body. No acute skull fracture. CT CERVICAL SPINE BONES AND ALIGNMENT: C1-C2 posterolateral surgical hardware. Thoracic surgical hardware starting at the T2 level is partially visualized. No CT findings to suggest surgical hardware complications. Old healed dens fracture. Stable grade 1 anterolisthesis of C3 on C4 and C4 on C5. Grade 1 anterolisthesis of C5 on C6. No acute fracture or traumatic malalignment. DEGENERATIVE CHANGES: No significant degenerative changes. SOFT TISSUES: No prevertebral soft tissue swelling. IMPRESSION: 1. No acute intracranial abnormality. 2. Acute 2.5 cm left occipital scalp hematoma. No retained  radiopaque foreign body. 3. No acute fracture or traumatic listhesis of the cervical spine. Electronically signed by: Kate Plummer MD 03/08/2024 07:49 PM EDT RP Workstation: [id number]   CT Cervical Spine Wo Contrast Result Date: 03/08/2024 EXAM: CT HEAD AND CERVICAL SPINE 03/08/2024 07:38:00 PM TECHNIQUE: CT of the head and cervical spine was performed without the administration of intravenous contrast. Multiplanar reformatted images are provided for review. Automated exposure control, iterative reconstruction, and/or weight based adjustment of the mA/kV was utilized to reduce the radiation dose to as low as reasonably achievable. COMPARISON: CT thoracic spine 10/22/2020, CT cervical spine 07/14/2020. CLINICAL HISTORY: Head trauma, moderate-severe. FINDINGS: CT HEAD BRAIN AND VENTRICLES: Patchy and confluent areas of decreased attenuation are noted throughout the deep and periventricular white matter of the cerebral hemispheres bilaterally, suggestive of chronic microvascular ischemic changes. No acute intracranial hemorrhage. No mass effect or midline shift. No abnormal extra-axial fluid collection. No evidence of acute infarct. No hydrocephalus. ORBITS: No acute abnormality. SINUSES AND MASTOIDS: No acute abnormality. SOFT TISSUES AND SKULL: 2.5 cm acute left occipital scalp hematoma. No retained radiopaque foreign body. No acute skull fracture. CT CERVICAL SPINE BONES AND ALIGNMENT: C1-C2 posterolateral surgical hardware. Thoracic surgical hardware starting at the T2 level is partially visualized. No CT findings to suggest surgical hardware complications. Old healed dens fracture. Stable grade 1 anterolisthesis of C3 on C4 and C4 on C5. Grade 1 anterolisthesis of C5 on C6. No acute fracture or traumatic malalignment. DEGENERATIVE CHANGES: No significant degenerative changes. SOFT TISSUES: No prevertebral soft tissue swelling. IMPRESSION: 1. No acute intracranial abnormality. 2. Acute 2.5 cm left occipital  scalp hematoma. No retained radiopaque foreign body. 3. No acute fracture or traumatic listhesis of the cervical spine. Electronically signed by: Morgane Naveau MD 03/08/2024 07:49 PM EDT RP Workstation: [id number]     Procedures   Medications Ordered in the ED - No data to display                                  Medical Decision Making Amount and/or Complexity of Data Reviewed Radiology: ordered.   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  Patient was brought here via EMS from home.  A few hours ago patient was walking down the steps in her garage when her left foot got caught on the steps and she fell forward striking her head against the ground.  She denies any loss of consciousness but she did need help to get up.  She did strike her head and complaining of pain to the left side of her head.  She denies any precipitating symptoms prior to the fall and she does not think she had any loss of consciousness.  She was previously taking Xarelto for DVT in her left leg but has stopped the medication approximately 4 days ago.  She did not complain of any pain in her chest or trouble breathing no abdominal pain.  She denies any new numbness or new weakness.  She does wear fentanyl  patch for her chronic pain and does have a pain contract.  She is up-to-date with tetanus.  She mention she normally wears a foot brace when she leaves the house to go to places but she did not wear her foot brace today.  EMS did place a c-collar prior to arrival.  Exam notable for large scalp hematoma on the left side but no other significant signs of injury noted.  Patient is mentating appropriately.  Answering question, appears to be in  no acute discomfort.  She does have 2 fentanyl  patch on her chest.  Will obtain head and C-spine CT for further assessment.  CT scan of the head and cervical spine obtained independent viewed and interpreted by me showing evidence of left occipital scalp but otherwise no other concerning feature.  I discussed this finding with patient.  Patient felt comfortable going home.  RICE therapy discussed.  Patient will continue taking her home medication as needed for pain control.     Final diagnoses:  Fall in home, initial encounter  Contusion of scalp, initial encounter    ED Discharge Orders     None          Nivia Colon, PA-C 03/08/24 2336    Simon Lavonia SAILOR, MD 03/09/24 [phone]

## 2024-03-08 NOTE — Discharge Instructions (Addendum)
 Fortunately no evidence of any broken bone or blood in your brain.  You do have a large bruise to the left side of your scalp.  Please apply ice ice pack to affected area 10 to 15 minutes each time to help with swelling.  Continue to take your home pain medication as needed.

## 2024-03-08 NOTE — ED Notes (Signed)
 Patient transported to CT

## 2024-03-08 NOTE — ED Notes (Signed)
 Pt reports she has a pain contract with her neurologist.
